# Patient Record
Sex: Female | Born: 1980
Health system: Southern US, Community
[De-identification: ages and names within clinical notes are randomized; demographics above are authoritative.]

## PROBLEM LIST (undated history)

## (undated) DIAGNOSIS — N200 Calculus of kidney: Secondary | ICD-10-CM

## (undated) DIAGNOSIS — M549 Dorsalgia, unspecified: Secondary | ICD-10-CM

## (undated) DIAGNOSIS — R600 Localized edema: Secondary | ICD-10-CM

## (undated) DIAGNOSIS — E282 Polycystic ovarian syndrome: Secondary | ICD-10-CM

## (undated) DIAGNOSIS — R51 Headache: Secondary | ICD-10-CM

## (undated) DIAGNOSIS — F1990 Other psychoactive substance use, unspecified, uncomplicated: Secondary | ICD-10-CM

## (undated) DIAGNOSIS — E559 Vitamin D deficiency, unspecified: Secondary | ICD-10-CM

## (undated) DIAGNOSIS — K589 Irritable bowel syndrome without diarrhea: Secondary | ICD-10-CM

## (undated) DIAGNOSIS — Z9889 Other specified postprocedural states: Secondary | ICD-10-CM

## (undated) DIAGNOSIS — R519 Headache, unspecified: Secondary | ICD-10-CM

## (undated) DIAGNOSIS — D649 Anemia, unspecified: Secondary | ICD-10-CM

## (undated) DIAGNOSIS — Z87442 Personal history of urinary calculi: Secondary | ICD-10-CM

## (undated) DIAGNOSIS — M2550 Pain in unspecified joint: Secondary | ICD-10-CM

## (undated) DIAGNOSIS — K829 Disease of gallbladder, unspecified: Secondary | ICD-10-CM

## (undated) DIAGNOSIS — R112 Nausea with vomiting, unspecified: Secondary | ICD-10-CM

## (undated) DIAGNOSIS — F419 Anxiety disorder, unspecified: Secondary | ICD-10-CM

## (undated) DIAGNOSIS — G4730 Sleep apnea, unspecified: Secondary | ICD-10-CM

## (undated) HISTORY — DX: Disease of gallbladder, unspecified: K82.9

## (undated) HISTORY — DX: Sleep apnea, unspecified: G47.30

## (undated) HISTORY — PX: LITHOTRIPSY: SUR834

## (undated) HISTORY — DX: Anxiety disorder, unspecified: F41.9

## (undated) HISTORY — DX: Anemia, unspecified: D64.9

## (undated) HISTORY — DX: Pain in unspecified joint: M25.50

## (undated) HISTORY — DX: Irritable bowel syndrome, unspecified: K58.9

## (undated) HISTORY — DX: Other psychoactive substance use, unspecified, uncomplicated: F19.90

## (undated) HISTORY — DX: Calculus of kidney: N20.0

## (undated) HISTORY — PX: LIPOMA EXCISION: SHX5283

## (undated) HISTORY — DX: Dorsalgia, unspecified: M54.9

## (undated) HISTORY — DX: Localized edema: R60.0

## (undated) HISTORY — DX: Vitamin D deficiency, unspecified: E55.9

## (undated) HISTORY — PX: CHOLECYSTECTOMY: SHX55

## (undated) HISTORY — DX: Polycystic ovarian syndrome: E28.2

## (undated) HISTORY — PX: URETEROSCOPY: SHX842

---
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
## 1999-02-02 ENCOUNTER — Emergency Department (HOSPITAL_COMMUNITY): Admission: EM | Admit: 1999-02-02 | Discharge: 1999-02-02 | Payer: Self-pay | Admitting: Emergency Medicine

## 1999-05-16 ENCOUNTER — Emergency Department (HOSPITAL_COMMUNITY): Admission: EM | Admit: 1999-05-16 | Discharge: 1999-05-16 | Payer: Self-pay | Admitting: Emergency Medicine

## 1999-06-06 ENCOUNTER — Emergency Department (HOSPITAL_COMMUNITY): Admission: EM | Admit: 1999-06-06 | Discharge: 1999-06-06 | Payer: Self-pay | Admitting: *Deleted

## 1999-08-08 ENCOUNTER — Encounter: Payer: Self-pay | Admitting: Emergency Medicine

## 1999-08-08 ENCOUNTER — Emergency Department (HOSPITAL_COMMUNITY): Admission: EM | Admit: 1999-08-08 | Discharge: 1999-08-08 | Payer: Self-pay | Admitting: Emergency Medicine

## 2000-01-09 ENCOUNTER — Emergency Department (HOSPITAL_COMMUNITY): Admission: EM | Admit: 2000-01-09 | Discharge: 2000-01-09 | Payer: Self-pay

## 2000-11-06 ENCOUNTER — Emergency Department (HOSPITAL_COMMUNITY): Admission: EM | Admit: 2000-11-06 | Discharge: 2000-11-06 | Payer: Self-pay | Admitting: Emergency Medicine

## 2000-11-23 ENCOUNTER — Other Ambulatory Visit: Admission: RE | Admit: 2000-11-23 | Discharge: 2000-11-23 | Payer: Self-pay | Admitting: Family Medicine

## 2001-03-26 ENCOUNTER — Emergency Department (HOSPITAL_COMMUNITY): Admission: EM | Admit: 2001-03-26 | Discharge: 2001-03-26 | Payer: Self-pay | Admitting: Emergency Medicine

## 2002-06-13 ENCOUNTER — Encounter: Payer: Self-pay | Admitting: Emergency Medicine

## 2002-06-13 ENCOUNTER — Emergency Department (HOSPITAL_COMMUNITY): Admission: EM | Admit: 2002-06-13 | Discharge: 2002-06-13 | Payer: Self-pay | Admitting: Emergency Medicine

## 2002-10-15 ENCOUNTER — Ambulatory Visit (HOSPITAL_COMMUNITY): Admission: RE | Admit: 2002-10-15 | Discharge: 2002-10-15 | Payer: Self-pay | Admitting: *Deleted

## 2003-01-07 ENCOUNTER — Ambulatory Visit (HOSPITAL_COMMUNITY): Admission: RE | Admit: 2003-01-07 | Discharge: 2003-01-07 | Payer: Self-pay | Admitting: *Deleted

## 2003-01-25 ENCOUNTER — Inpatient Hospital Stay (HOSPITAL_COMMUNITY): Admission: AD | Admit: 2003-01-25 | Discharge: 2003-01-25 | Payer: Self-pay | Admitting: *Deleted

## 2003-01-27 ENCOUNTER — Inpatient Hospital Stay (HOSPITAL_COMMUNITY): Admission: AD | Admit: 2003-01-27 | Discharge: 2003-01-29 | Payer: Self-pay | Admitting: *Deleted

## 2003-01-31 ENCOUNTER — Encounter: Admission: RE | Admit: 2003-01-31 | Discharge: 2003-01-31 | Payer: Self-pay | Admitting: *Deleted

## 2003-02-11 ENCOUNTER — Inpatient Hospital Stay (HOSPITAL_COMMUNITY): Admission: AD | Admit: 2003-02-11 | Discharge: 2003-02-14 | Payer: Self-pay | Admitting: Obstetrics & Gynecology

## 2003-02-15 ENCOUNTER — Inpatient Hospital Stay (HOSPITAL_COMMUNITY): Admission: AD | Admit: 2003-02-15 | Discharge: 2003-02-15 | Payer: Self-pay | Admitting: Family Medicine

## 2003-02-21 ENCOUNTER — Encounter: Admission: RE | Admit: 2003-02-21 | Discharge: 2003-02-21 | Payer: Self-pay | Admitting: *Deleted

## 2003-02-28 ENCOUNTER — Encounter: Admission: RE | Admit: 2003-02-28 | Discharge: 2003-02-28 | Payer: Self-pay | Admitting: *Deleted

## 2003-03-07 ENCOUNTER — Encounter: Admission: RE | Admit: 2003-03-07 | Discharge: 2003-03-07 | Payer: Self-pay | Admitting: *Deleted

## 2003-03-14 ENCOUNTER — Encounter: Admission: RE | Admit: 2003-03-14 | Discharge: 2003-03-14 | Payer: Self-pay | Admitting: *Deleted

## 2003-03-15 ENCOUNTER — Inpatient Hospital Stay (HOSPITAL_COMMUNITY): Admission: AD | Admit: 2003-03-15 | Discharge: 2003-03-15 | Payer: Self-pay | Admitting: Obstetrics & Gynecology

## 2003-03-20 ENCOUNTER — Inpatient Hospital Stay (HOSPITAL_COMMUNITY): Admission: AD | Admit: 2003-03-20 | Discharge: 2003-03-22 | Payer: Self-pay | Admitting: Obstetrics and Gynecology

## 2003-03-27 ENCOUNTER — Inpatient Hospital Stay (HOSPITAL_COMMUNITY): Admission: AD | Admit: 2003-03-27 | Discharge: 2003-03-28 | Payer: Self-pay | Admitting: Obstetrics and Gynecology

## 2003-08-03 ENCOUNTER — Emergency Department (HOSPITAL_COMMUNITY): Admission: EM | Admit: 2003-08-03 | Discharge: 2003-08-03 | Payer: Self-pay | Admitting: Emergency Medicine

## 2004-02-24 ENCOUNTER — Inpatient Hospital Stay (HOSPITAL_COMMUNITY): Admission: AD | Admit: 2004-02-24 | Discharge: 2004-02-24 | Payer: Self-pay | Admitting: Obstetrics and Gynecology

## 2005-02-23 ENCOUNTER — Inpatient Hospital Stay (HOSPITAL_COMMUNITY): Admission: AD | Admit: 2005-02-23 | Discharge: 2005-02-24 | Payer: Self-pay | Admitting: *Deleted

## 2005-05-20 ENCOUNTER — Ambulatory Visit (HOSPITAL_COMMUNITY): Admission: RE | Admit: 2005-05-20 | Discharge: 2005-05-20 | Payer: Self-pay | Admitting: Obstetrics

## 2005-07-12 ENCOUNTER — Emergency Department (HOSPITAL_COMMUNITY): Admission: EM | Admit: 2005-07-12 | Discharge: 2005-07-12 | Payer: Self-pay | Admitting: Emergency Medicine

## 2005-09-02 ENCOUNTER — Emergency Department (HOSPITAL_COMMUNITY): Admission: EM | Admit: 2005-09-02 | Discharge: 2005-09-02 | Payer: Self-pay | Admitting: Emergency Medicine

## 2005-09-17 ENCOUNTER — Inpatient Hospital Stay (HOSPITAL_COMMUNITY): Admission: AD | Admit: 2005-09-17 | Discharge: 2005-09-18 | Payer: Self-pay | Admitting: Obstetrics

## 2005-10-14 ENCOUNTER — Inpatient Hospital Stay (HOSPITAL_COMMUNITY): Admission: AD | Admit: 2005-10-14 | Discharge: 2005-10-16 | Payer: Self-pay | Admitting: Obstetrics

## 2005-10-17 ENCOUNTER — Inpatient Hospital Stay (HOSPITAL_COMMUNITY): Admission: AD | Admit: 2005-10-17 | Discharge: 2005-10-19 | Payer: Self-pay | Admitting: Obstetrics

## 2005-10-22 ENCOUNTER — Inpatient Hospital Stay (HOSPITAL_COMMUNITY): Admission: AD | Admit: 2005-10-22 | Discharge: 2005-10-25 | Payer: Self-pay | Admitting: Obstetrics

## 2006-02-15 ENCOUNTER — Emergency Department (HOSPITAL_COMMUNITY): Admission: EM | Admit: 2006-02-15 | Discharge: 2006-02-16 | Payer: Self-pay | Admitting: Emergency Medicine

## 2006-02-17 ENCOUNTER — Ambulatory Visit (HOSPITAL_COMMUNITY): Admission: RE | Admit: 2006-02-17 | Discharge: 2006-02-17 | Payer: Self-pay | Admitting: Urology

## 2006-02-17 ENCOUNTER — Emergency Department (HOSPITAL_COMMUNITY): Admission: EM | Admit: 2006-02-17 | Discharge: 2006-02-18 | Payer: Self-pay | Admitting: Emergency Medicine

## 2006-10-01 ENCOUNTER — Emergency Department (HOSPITAL_COMMUNITY): Admission: EM | Admit: 2006-10-01 | Discharge: 2006-10-01 | Payer: Self-pay | Admitting: Family Medicine

## 2006-11-14 ENCOUNTER — Emergency Department (HOSPITAL_COMMUNITY): Admission: EM | Admit: 2006-11-14 | Discharge: 2006-11-15 | Payer: Self-pay | Admitting: Family Medicine

## 2007-03-19 ENCOUNTER — Emergency Department (HOSPITAL_COMMUNITY): Admission: EM | Admit: 2007-03-19 | Discharge: 2007-03-19 | Payer: Self-pay | Admitting: Emergency Medicine

## 2007-05-18 ENCOUNTER — Ambulatory Visit (HOSPITAL_COMMUNITY): Admission: RE | Admit: 2007-05-18 | Discharge: 2007-05-18 | Payer: Self-pay | Admitting: Obstetrics

## 2007-07-10 ENCOUNTER — Emergency Department (HOSPITAL_COMMUNITY): Admission: EM | Admit: 2007-07-10 | Discharge: 2007-07-10 | Payer: Self-pay | Admitting: Family Medicine

## 2007-07-13 ENCOUNTER — Emergency Department (HOSPITAL_COMMUNITY): Admission: EM | Admit: 2007-07-13 | Discharge: 2007-07-13 | Payer: Self-pay | Admitting: Family Medicine

## 2007-09-05 ENCOUNTER — Emergency Department (HOSPITAL_COMMUNITY): Admission: EM | Admit: 2007-09-05 | Discharge: 2007-09-05 | Payer: Self-pay | Admitting: Family Medicine

## 2007-12-06 ENCOUNTER — Emergency Department (HOSPITAL_COMMUNITY): Admission: EM | Admit: 2007-12-06 | Discharge: 2007-12-06 | Payer: Self-pay | Admitting: Family Medicine

## 2007-12-20 ENCOUNTER — Emergency Department (HOSPITAL_COMMUNITY): Admission: EM | Admit: 2007-12-20 | Discharge: 2007-12-20 | Payer: Self-pay | Admitting: Emergency Medicine

## 2008-02-07 ENCOUNTER — Ambulatory Visit (HOSPITAL_COMMUNITY): Admission: RE | Admit: 2008-02-07 | Discharge: 2008-02-07 | Payer: Self-pay | Admitting: Orthopedic Surgery

## 2008-02-21 ENCOUNTER — Emergency Department (HOSPITAL_COMMUNITY): Admission: EM | Admit: 2008-02-21 | Discharge: 2008-02-21 | Payer: Self-pay | Admitting: Family Medicine

## 2008-05-04 ENCOUNTER — Emergency Department (HOSPITAL_COMMUNITY): Admission: EM | Admit: 2008-05-04 | Discharge: 2008-05-04 | Payer: Self-pay | Admitting: Emergency Medicine

## 2008-08-31 ENCOUNTER — Emergency Department: Payer: Self-pay | Admitting: Emergency Medicine

## 2008-11-22 ENCOUNTER — Emergency Department (HOSPITAL_COMMUNITY): Admission: EM | Admit: 2008-11-22 | Discharge: 2008-11-22 | Payer: Self-pay | Admitting: Family Medicine

## 2009-03-17 ENCOUNTER — Emergency Department (HOSPITAL_COMMUNITY): Admission: EM | Admit: 2009-03-17 | Discharge: 2009-03-17 | Payer: Self-pay | Admitting: Emergency Medicine

## 2009-04-09 ENCOUNTER — Emergency Department (HOSPITAL_COMMUNITY): Admission: EM | Admit: 2009-04-09 | Discharge: 2009-04-09 | Payer: Self-pay | Admitting: Emergency Medicine

## 2009-09-30 ENCOUNTER — Encounter: Admission: RE | Admit: 2009-09-30 | Discharge: 2009-09-30 | Payer: Self-pay | Admitting: Family Medicine

## 2010-03-08 ENCOUNTER — Encounter: Payer: Self-pay | Admitting: Obstetrics

## 2010-03-08 ENCOUNTER — Encounter: Payer: Self-pay | Admitting: Orthopedic Surgery

## 2010-04-18 ENCOUNTER — Emergency Department: Payer: Self-pay | Admitting: Emergency Medicine

## 2010-05-03 LAB — POCT URINALYSIS DIP (DEVICE)
Bilirubin Urine: NEGATIVE
Glucose, UA: NEGATIVE mg/dL
Hgb urine dipstick: NEGATIVE
Ketones, ur: NEGATIVE mg/dL
Nitrite: NEGATIVE
Protein, ur: NEGATIVE mg/dL
Specific Gravity, Urine: 1.015 (ref 1.005–1.030)
Urobilinogen, UA: 1 mg/dL (ref 0.0–1.0)
pH: 8.5 — ABNORMAL HIGH (ref 5.0–8.0)

## 2010-05-03 LAB — WET PREP, GENITAL
Trich, Wet Prep: NONE SEEN
Yeast Wet Prep HPF POC: NONE SEEN

## 2010-05-03 LAB — POCT PREGNANCY, URINE: Preg Test, Ur: NEGATIVE

## 2010-05-03 LAB — GC/CHLAMYDIA PROBE AMP, GENITAL
Chlamydia, DNA Probe: NEGATIVE
GC Probe Amp, Genital: NEGATIVE

## 2010-07-03 NOTE — Op Note (Signed)
NAMEVALARIE, FARACE                         ACCOUNT NO.:  [id number]   MEDICAL RECORD NO.:  [id number]                   PATIENT TYPE:  AMB   LOCATION:  DAY                                  FACILITY:  [facility]   PHYSICIAN:  Bertram Millard. Dahlstedt, M.D.          DATE OF BIRTH:  1981/01/08   DATE OF PROCEDURE:  02/13/2003  DATE OF DISCHARGE:                                 OPERATIVE REPORT   PREOPERATIVE DIAGNOSES:  Right ureteral stone with hydronephrosis, 35 week  intrauterine pregnancy.   POSTOPERATIVE DIAGNOSES:  Right ureteral stone with hydronephrosis, 35 week  intrauterine pregnancy.   PRINCIPAL PROCEDURE:  Cystoscopy, right ureteral stone extraction, double J  stent placement.   SURGEON:  Bertram Millard. Dahlstedt, M.D.   ANESTHESIA:  Subarachnoid block.   COMPLICATIONS:  None.   BRIEF HISTORY:  A 30 year old female who I have seen several times over the  past two weeks. She has recently passed a right ureteral stone. When she  passed her ureteral stone, she was found to have a renal stone. She recently  had this drop into her ureter and she has been quite symptomatic over the  past few days. She has presented in my office once and was admitted to the  hospital two days ago for pain management.   At this point, she has had nonprogression of the stone.  It was recommended  that she have ureteroscopy with stone extraction.  Other alternatives of  watchful waiting and lithotripsy were discussed.  Lithotripsy is not an  option due to the large size of the fetus and the amount of radiation  necessary.  We talked about the risk of performing this procedure, the risk  of producing early labor versus the risk of early labor from pain and from  pyelonephritis. She agrees to proceed with surgery.   DESCRIPTION OF PROCEDURE:  The patient was administered preoperative IV  antibiotics in the holding room and taken to the operating room where the  subarachnoid block was performed by Dr.  Okey Dupre. The patient was placed into  the dorsal lithotomy position.  Genitalia and perineum were prepped and  draped.  A 6 French ureteroscope was advanced directly into the right ureter  where the stone was encountered. There was seemingly a small stone behind  this one stone. However, it was easily fragmented with the beak of the  scope.  The one stone present distally in the ureter was grasped with the  basket and extracted. I felt that with a little bit of spasm where the stone  was, and the patient's two stones over a two week period, it would be best  to stent her.  I placed a guidewire up into the right renal pelvis directly  without using fluoroscopy.  Over the top of this, I put a 24 cm 6 [place]  double J stent.  A good curl was seen distally. The string was left and  taped to the patient's perineum.   The patient tolerated the procedure well. She will be monitored in the PACU  for fetal heart tones and then returned to the antenatal unit at [facility].                                               Bertram Millard. Dahlstedt, M.D.    SMD/MEDQ  D:  02/13/2003  T:  02/13/2003  Job:  [id number]   cc:   Lesly Dukes, M.D.

## 2010-07-03 NOTE — Discharge Summary (Signed)
NAMELAURITA, [name]                         ACCOUNT NO.:  [id number]   MEDICAL RECORD NO.:  [id number]                   PATIENT TYPE:  INP   LOCATION:  9128                                 [facility]ILITY:  [facility]   PHYSICIAN:  Phil D. Okey Dupre, M.D.                  DATE OF BIRTH:  1980/06/02   DATE OF ADMISSION:  03/27/2003  DATE OF DISCHARGE:  03/28/2003                                 DISCHARGE SUMMARY   DISCHARGE DIAGNOSES:  1. Pyelonephritis.  2. Status post vaginal delivery on March 20, 2003.   DISCHARGE MEDICATIONS:  Tequin 400 mg one tab p.o. daily, #13.   FOLLOWUP:  The patient is to follow up with her primary care physician in  two weeks.   HOSPITAL PROCEDURES:  None.   PRESENTING HISTORY:  This is  30 year old, G2, P2, who presented on the  afternoon of March 27, 2003, with a chief complaint of fever, right-sided  back pain, and pain with urination times three days.  Of note the patient  had been discharged home five days prior to presentation after delivering an  infant on March 20, 2003, here at [facility].   MEDICATIONS:  Prenatal vitamins.   ALLERGIES:  No known drug allergies.   PAST MEDICAL HISTORY:  OB history--vaginal delivery times two. One just  previously and the other in December 1997.  GYN history--abnormal Pap in  1999 and 2000 coupled with biopsy in 2000, which was negative.  Medical  history--was admitted on February 11, 2003, for cystoscopy for renal  calculi. At that time hydronephrosis was noted.   PAST SURGICAL HISTORY:  Negative.   SOCIAL HISTORY:  The patient smokes three to four cigarettes a day. No  alcohol.   PHYSICAL EXAMINATION:  On presentation, the temperature is 101.9, pulse 114,  respiratory rate 20, blood pressure 144/70.  GENERAL APPEARANCE: Alert, pleasant female in no acute distress.  LUNGS: Clear to auscultation bilaterally.  CARDIOVASCULAR: Regular rate and rhythm with a 2/6 systolic murmur.  ABDOMEN: Soft. Positive  bowel sounds. Some right upper quadrant tenderness  and tenderness in the suprapubic area.  BACK: She did have positive right CVA tenderness.   LABORATORY DATA:  UA was positive for nitrites, rbc's greater than 200.  Protein micro showed white blood cells and rbc's with too numerous count  bacteria.  Assessment at that time was pyelonephritis and patient was  admitted for further workup.   HOSPITAL COURSE:  The patient was admitted and started on IV Rocephin 1 gm  q.24h. and urine was sent for culture.  The patient improved rapidly over  the course of her admission. She was not nauseous or vomiting at the time of  discharge. Therefore, she was discharged home on Tequin with instructions to  call with fever greater than 103 or nausea or vomiting so that she could not  keep down fluids or her medications. She is to  follow up with her primary  care physician in two weeks, to call us if she has any problems prior to  that time.   PENDING LABORATORY:  The patient does have a laboratory that did come back  E. coli in her urine. Sensitivities are still pending at the time of  discharge.     Bradly Bienenstock, M.D.                         Phil D. Okey Dupre, M.D.    [name]  D:  03/28/2003  T:  03/28/2003  Job:  [id number]

## 2010-07-03 NOTE — Discharge Summary (Signed)
[name], [name]                         ACCOUNT NO.:  [id number]   MEDICAL RECORD NO.:  [id number]                   PATIENT TYPE:  INP   LOCATION:  9105                                 [facility]ACILITY:  [facility]   PHYSICIAN:  Conni Elliot, M.D.             DATE OF BIRTH:  12-07-1980   DATE OF ADMISSION:  01/27/2003  DATE OF DISCHARGE:  01/29/2003                                 DISCHARGE SUMMARY   ATTENDING PHYSICIAN:  Conni Elliot, M.D.   INTERN:  Ursula Beath, M.D.   DISCHARGE DIAGNOSES:  1. Nephrolithiasis.  2. Intrauterine pregnancy.   DISCHARGE MEDICATIONS:  1. Prenatal vitamins one p.o. daily.  2. Ceftin 250 mg one p.o. b.i.d. x9 days.  3. Percocet 5 per 325 mg one p.o. q.4-6h. p.r.n. pain.   DISPOSITION:  Is discharged to home.   FOLLOWUP:  Will be at the [facility] on Thursday,  January 31, 2003.  The patient was instructed that she will be called with  an appointment, and to call them on Wednesday if she does not hear from  them.   PROCEDURE:  1. A CT of the abdomen.  2. A renal ultrasound showing moderate right hydronephrosis, but no visible     stones, and a left kidney which is normal.  The results of this CT scan     showed no appendicitis, a 3.0 mm stone at the right ureteropelvic     junction, and moderate right hydronephrosis.  3. She also underwent an obstetrical ultrasound showing a single live     intrauterine pregnancy and the abdominal circumference measuring     approximately 2-1/2 weeks, with an estimated fetal weight of greater than     90th percentile.  The AFI was within normal limits.  No evidence of     placental abruption or previa.   CONSULTATIONS:  Consultation was with Dr. Bertram Millard. Dahlstedt of urology.   HISTORY OF PRESENT ILLNESS:  This is a 30 year old G 2, P 1, 0, 0, 1 at 33  weeks, presenting with right lower quadrant pain that developed on the night  prior to admission, with constant pain and  worsened with movement.  She had  a similar episode last year.  Her father has a history of kidney stones.  She also describes some nausea and vomiting.  Her pain is constant.   PHYSICAL EXAMINATION:  ABDOMEN:  The patient has guarding and rebound on the  right side.  An ultrasound showed right hydronephrosis but no stone.   LABORATORY DATA:  White count 13, H&H 10.3 and 30.2, platelets 237.  A CBC  on the subsequent day showed a white count of 15.3, H&H of 10 and 29,  platelets 240.  She had an 87% neutrophil count.  A Chem-7 showed a sodium  of 135, potassium 3.7, chloride 103, bicarbonate 25, BUN 16, creatinine 0.9,  glucose 91.  A  total bilirubin of 0.3, alkaline phosphatase 59, SGOT 15,  SGPT less than 19, total protein 6.8, albumin 3.3, calcium 9.0, uric acid  3.8, LDH 113.  A urinalysis showed specific gravity greater than 1039, pH of  5.5, negative for glucose, negative for bilirubin, negative for ketones,  trace blood, negative protein, 0.2 urobilinogen, negative nitrites, negative  leukocytes, a few epithelials, 0-2 white cells, 0-2 red cells, a few  bacteria and mucus.   HOSPITAL COURSE:  #1 - NEPHROLITHIASIS:  The patient was admitted for pain  control, and was started on prophylactic antibiotics on the day after her  admission.  She was afebrile, and the night prior to discharge she passed a  stone.  A urology consultation stated that if the patient did not pass the  stone, they would come back on the Wednesday after admission and break it  up; however, this did not become a necessity.  The patient remained  afebrile, but was continued on prophylactic antibiotics.  She will be given  a prescription to continue these at home.  She will be seen in the [facility] on the Thursday after discharge.  #2 - INTRAUTERINE PREGNANCY:  Apparently her most recent ultrasound was  suggestive of macrosomia, and it was suggested that this be followed  carefully at the [facility]  or with her primary care doctor.     Ursula Beath, MD                     Conni Elliot, M.D.    JT/MEDQ  D:  01/29/2003  T:  01/29/2003  Job:  [id number]   cc:   Bertram Millard. Dahlstedt, M.D.  [address]  [place]  [place] [address]  Fax: [phone]   [facility] - [facility]

## 2010-07-03 NOTE — Discharge Summary (Signed)
[name], [name]                         ACCOUNT NO.:  [id number]   MEDICAL RECORD NO.:  [id number]                   PATIENT TYPE:  INP   LOCATION:  9103                                 [facility]ACILITY[facility]   PHYSICIAN:  Bertram Millard. Dahlstedt, M.D.          DATE OF BIRTH:  March 03, 1980   DATE OF ADMISSION:  02/11/2003  DATE OF DISCHARGE:  02/14/2003                                 DISCHARGE SUMMARY   DIAGNOSES:  1. Right ureteral calculus.  2. Intrauterine pregnancy, third trimester.   PRINCIPAL PROCEDURE:  February 13, 2003 - cystoscopy, right ureteral stone  extraction, double J stent placement.   BRIEF HISTORY:  This 30 year old female is a gravida 2 para 1 and is at 35  weeks intrauterine pregnancy.  I had seen her several times over the 2 weeks  prior to this admission.  She passed a right ureteral stone.  At the time of  her first presentation to me, she also had a right renal stone.  This  recently, by history, fell into her right ureter.  She has been symptomatic  over the past several days.  I have seen her as an outpatient.   She had significant pain and she was difficult to manage as an outpatient.  She was therefore admitted to [facility] teaching service for pain  management and possible urologic intervention.   Past medical history significant for a prior vaginal delivery.  She passed a  stone 2 weeks ago and was hospitalized for that.  Medical history is  otherwise unremarkable.   Medications at the time of admission included prenatal vitamins.  There were  no known drug allergies.   Examination revealed a young adult female in moderate distress.  She weighed  260 pounds.  Height 5 feet 2 inches.  She was afebrile.  Blood pressure  113/55, pulse 90.  Examination was performed by Dr. Penne Lash.  She was in  pain.  She had a gravid abdomen.  She had right CVA tenderness.  She had  trace edema of her lower extremities, HEENT normal, chest clear, breast exam  not  performed, heart regular rate and rhythm.   HOSPITAL COURSE:  The patient was admitted to the [facility] service.  She was  placed on adequate IV analgesia.  Based on her symptoms I felt that she had  a stone at her right UVJ.  By December 29 she had persistent pain and had  not passed a stone.  At this point she had a neonatology consult to proceed  with surgery.  The patient was cleared.  She underwent pre- and  postoperative fetal monitoring.  On December 29 she underwent right  ureteroscopy with stone extraction and a double J stent was placed.  This  was done with a spinal anesthetic.  The patient tolerated the procedure  well.  She was discharged home postoperatively on December 30.  She did  well.  A prescription  for Keflex 500 mg daily for 5 days was called in.  It  was arranged for her to see me in the office a week after discharge.  She  was in improved condition at the time of discharge.                                               Bertram Millard. Dahlstedt, M.D.    SMD/MEDQ  D:  05/01/2003  T:  05/03/2003  Job:  [id number]

## 2010-07-03 NOTE — Consult Note (Signed)
NAMEAIMAN, [name]                         ACCOUNT NO.:  [id number]   MEDICAL RECORD NO.:  [id number]                   [name] TYPE:  OBV   LOCATION:  9105                                 [facility]ACILITY:  [facility]   PHYSICIAN:  Bertram Millard. Dahlstedt, M.D.          DATE OF BIRTH:  1980/05/26   DATE OF CONSULTATION:  01/28/2003  DATE OF DISCHARGE:                                   CONSULTATION   REASON FOR CONSULTATION:  Right ureteral stone.   BRIEF HISTORY:  [name] with Tiffany remote of Tiffany possible stone,  approximately two years ago. [name] was admitted to Tiffany hospital yesterday with  Tiffany 24-hour history of crampy right abdominal and flank pain, nausea, and  vomiting.  This was fairly severe. [name] presented to Tiffany emergency department  at [facility] and was subsequently admitted for possible right  ureteral stone.   [name] had an ultrasound at that time which revealed significant right  hydronephrosis. No stones were seen on either side. [name] had an elevated  white count of 13,000. [name] had trace blood on urine dipstick.   CT scan was performed which was showed Tiffany small, 3 mm, right distal ureteral  stone with significant hydronephrosis. Again, no stones were seen in Tiffany  kidneys. Urologic consultation is requested.   Past history is significant for kidney stone. [name] is gravida 2, para 1, and  has Tiffany young daughter. [name] has smoked in Tiffany past, but quit with [name]  pregnancy. [name] is status post cervical biopsy and colposcopy done four years  ago.   PHYSICAL EXAMINATION:  Exam reveals minimal right CVA and right left lower  quadrant tenderness. [name] has no rebound or guarding. [name] has no left-sided  pain. [name] has an obviously gravid abdomen.   I reviewed [name]'s CT scan. This shows Tiffany previously mentioned  findings at Tiffany right UVJ.   IMPRESSION:  1. Right ureteral calculus with significant hydronephrosis and pain, which     is managed at Tiffany present time appropriately with  intravenous Stadol.  2. Intrauterine pregnancy.   PLAN:  1. At this point, would give [name] about 24 more hours to see if we     have [name] pass Tiffany stone without operative intervention.  2. If [name] persists with pain, would recommend proceeding on Wednesday,     December 15th, with ureteroscopy and stone extraction. I discussed this     with [name] and [name] mother.  3. I will follow [name] up tomorrow and make Tiffany necessary arrangements if [name]     does not pass Tiffany stone.  4. Would continue Stadol and antibiotic prophylaxis as written.  Bertram Millard. Dahlstedt, M.D.    SMD/MEDQ  D:  01/28/2003  T:  01/28/2003  Job:  [id number]

## 2010-11-06 LAB — POCT URINALYSIS DIP (DEVICE)
Bilirubin Urine: NEGATIVE
Glucose, UA: NEGATIVE
Hgb urine dipstick: NEGATIVE
Ketones, ur: NEGATIVE
Nitrite: NEGATIVE
Operator id: 235561
Protein, ur: NEGATIVE
Specific Gravity, Urine: 1.025
Urobilinogen, UA: 0.2
pH: 6.5

## 2010-11-06 LAB — DIFFERENTIAL
Basophils Absolute: 0.1
Basophils Relative: 1
Eosinophils Absolute: 0.2
Eosinophils Relative: 2
Lymphocytes Relative: 28
Lymphs Abs: 2.4
Monocytes Absolute: 0.5
Monocytes Relative: 7
Neutro Abs: 5.2
Neutrophils Relative %: 63

## 2010-11-06 LAB — POCT PREGNANCY, URINE
Operator id: 235561
Preg Test, Ur: NEGATIVE

## 2010-11-06 LAB — CBC
HCT: 36.5
Hemoglobin: 12.3
MCHC: 33.8
MCV: 88.2
Platelets: 233
RBC: 4.14
RDW: 13.8
WBC: 8.4

## 2010-11-06 LAB — WET PREP, GENITAL
Clue Cells Wet Prep HPF POC: NONE SEEN
Trich, Wet Prep: NONE SEEN
WBC, Wet Prep HPF POC: NONE SEEN
Yeast Wet Prep HPF POC: NONE SEEN

## 2010-11-06 LAB — BASIC METABOLIC PANEL
BUN: 9
CO2: 25
Calcium: 9
Chloride: 109
Creatinine, Ser: 0.69
GFR calc Af Amer: >60
GFR calc non Af Amer: >60
Glucose, Bld: 96
Potassium: 3.6
Sodium: 139

## 2010-11-06 LAB — GC/CHLAMYDIA PROBE AMP, GENITAL
Chlamydia, DNA Probe: NEGATIVE
GC Probe Amp, Genital: NEGATIVE

## 2010-11-06 LAB — RPR: RPR Ser Ql: NONREACTIVE

## 2010-11-11 LAB — POCT RAPID STREP A: Streptococcus, Group A Screen (Direct): POSITIVE — AB

## 2010-11-13 LAB — POCT RAPID STREP A: Streptococcus, Group A Screen (Direct): POSITIVE — AB

## 2010-11-18 LAB — URINALYSIS, ROUTINE W REFLEX MICROSCOPIC
Bilirubin Urine: NEGATIVE
Glucose, UA: NEGATIVE
Hgb urine dipstick: NEGATIVE
Ketones, ur: NEGATIVE
Nitrite: NEGATIVE
Protein, ur: NEGATIVE
Specific Gravity, Urine: 1.019
Urobilinogen, UA: 0.2
pH: 5.5

## 2010-11-18 LAB — PREGNANCY, URINE: Preg Test, Ur: NEGATIVE

## 2010-11-26 LAB — BASIC METABOLIC PANEL
BUN: 14
CO2: 27
Calcium: 9.4
Chloride: 103
Creatinine, Ser: 0.73
GFR calc Af Amer: >60
GFR calc non Af Amer: >60
Glucose, Bld: 82
Potassium: 3.8
Sodium: 138

## 2010-11-26 LAB — POCT URINALYSIS DIP (DEVICE)
Glucose, UA: NEGATIVE
Ketones, ur: NEGATIVE
Nitrite: NEGATIVE
Operator id: 116391
Protein, ur: NEGATIVE
Specific Gravity, Urine: >=1.03
Urobilinogen, UA: 1
pH: 6

## 2010-11-26 LAB — DIFFERENTIAL
Basophils Absolute: 0
Basophils Relative: 0
Eosinophils Absolute: 0.2
Eosinophils Relative: 1
Lymphocytes Relative: 27
Lymphs Abs: 3
Monocytes Absolute: 0.6
Monocytes Relative: 5
Neutro Abs: 7.3
Neutrophils Relative %: 66

## 2010-11-26 LAB — CBC
HCT: 39
Hemoglobin: 13.2
MCHC: 33.8
MCV: 87.5
Platelets: 319
RBC: 4.45
RDW: 14.5 — ABNORMAL HIGH
WBC: 11.2 — ABNORMAL HIGH

## 2010-11-26 LAB — POCT PREGNANCY, URINE
Operator id: 116391
Preg Test, Ur: NEGATIVE

## 2011-03-09 ENCOUNTER — Other Ambulatory Visit: Payer: Self-pay | Admitting: Family Medicine

## 2011-03-09 DIAGNOSIS — R109 Unspecified abdominal pain: Secondary | ICD-10-CM

## 2011-03-10 ENCOUNTER — Ambulatory Visit
Admission: RE | Admit: 2011-03-10 | Discharge: 2011-03-10 | Disposition: A | Discharge status: Home / Self Care | Payer: BC Managed Care – PPO | Source: Ambulatory Visit | Attending: Family Medicine | Admitting: Family Medicine

## 2011-03-10 DIAGNOSIS — R109 Unspecified abdominal pain: Secondary | ICD-10-CM

## 2011-04-09 ENCOUNTER — Inpatient Hospital Stay (HOSPITAL_COMMUNITY): Admit: 2011-04-09 | Payer: Self-pay

## 2011-10-14 ENCOUNTER — Emergency Department: Payer: Self-pay | Admitting: Emergency Medicine

## 2011-10-14 LAB — CBC
HCT: 36.8 % (ref 35.0–47.0)
HGB: 12.2 g/dL (ref 12.0–16.0)
MCH: 30.2 pg (ref 26.0–34.0)
MCHC: 33.2 g/dL (ref 32.0–36.0)
MCV: 91 fL (ref 80–100)
Platelet: 264 10*3/uL (ref 150–440)
RBC: 4.05 10*6/uL (ref 3.80–5.20)
RDW: 14.1 % (ref 11.5–14.5)
WBC: 7.6 10*3/uL (ref 3.6–11.0)

## 2011-10-14 LAB — COMPREHENSIVE METABOLIC PANEL
Albumin: 3.8 g/dL (ref 3.4–5.0)
Alkaline Phosphatase: 48 U/L — ABNORMAL LOW (ref 50–136)
Anion Gap: 5 — ABNORMAL LOW (ref 7–16)
BUN: 11 mg/dL (ref 7–18)
Bilirubin,Total: 0.4 mg/dL (ref 0.2–1.0)
Calcium, Total: 9 mg/dL (ref 8.5–10.1)
Chloride: 106 mmol/L (ref 98–107)
Co2: 27 mmol/L (ref 21–32)
Creatinine: 0.55 mg/dL — ABNORMAL LOW (ref 0.60–1.30)
EGFR (African American): >60
EGFR (Non-African Amer.): >60
Glucose: 90 mg/dL (ref 65–99)
Osmolality: 275 (ref 275–301)
Potassium: 4.1 mmol/L (ref 3.5–5.1)
SGOT(AST): 8 U/L — ABNORMAL LOW (ref 15–37)
SGPT (ALT): 13 U/L (ref 12–78)
Sodium: 138 mmol/L (ref 136–145)
Total Protein: 7.4 g/dL (ref 6.4–8.2)

## 2011-10-14 LAB — URINALYSIS, COMPLETE
Bilirubin,UR: NEGATIVE
Blood: NEGATIVE
Glucose,UR: NEGATIVE mg/dL (ref 0–75)
Ketone: NEGATIVE
Leukocyte Esterase: NEGATIVE
Nitrite: NEGATIVE
Ph: 7 (ref 4.5–8.0)
Protein: NEGATIVE
RBC,UR: 2 /HPF (ref 0–5)
Specific Gravity: 1.018 (ref 1.003–1.030)
Squamous Epithelial: 2
WBC UR: <1 /HPF (ref 0–5)

## 2011-10-14 LAB — PREGNANCY, URINE: Pregnancy Test, Urine: NEGATIVE m[IU]/mL

## 2011-10-14 LAB — LIPASE, BLOOD: Lipase: 112 U/L (ref 73–393)

## 2012-12-15 ENCOUNTER — Ambulatory Visit (INDEPENDENT_AMBULATORY_CARE_PROVIDER_SITE_OTHER): Payer: 59 | Admitting: Advanced Practice Midwife

## 2012-12-15 ENCOUNTER — Encounter: Payer: Self-pay | Admitting: Advanced Practice Midwife

## 2012-12-15 VITALS — BP 125/83 | HR 78 | Temp 98.1°F | Ht 63.0 in | Wt 254.0 lb

## 2012-12-15 DIAGNOSIS — R102 Pelvic and perineal pain: Secondary | ICD-10-CM

## 2012-12-15 DIAGNOSIS — R1900 Intra-abdominal and pelvic swelling, mass and lump, unspecified site: Secondary | ICD-10-CM

## 2012-12-15 DIAGNOSIS — Z975 Presence of (intrauterine) contraceptive device: Secondary | ICD-10-CM

## 2012-12-15 DIAGNOSIS — Z113 Encounter for screening for infections with a predominantly sexual mode of transmission: Secondary | ICD-10-CM

## 2012-12-15 DIAGNOSIS — Z3202 Encounter for pregnancy test, result negative: Secondary | ICD-10-CM

## 2012-12-15 LAB — POCT URINE PREGNANCY: Preg Test, Ur: NEGATIVE

## 2012-12-15 NOTE — Progress Notes (Signed)
Subjective:     Patient ID: [name], female   DOB: April 01, 1980, 32 y.o.   MRN: [id number]  Gynecologic Exam The patient's primary symptoms include pelvic pain. The patient's pertinent negatives include no genital lesions, genital odor, genital rash, vaginal bleeding or vaginal discharge. Pertinent negatives include no abdominal pain, chills, constipation, diarrhea, discolored urine, dysuria, fever, flank pain, headaches, joint pain, joint swelling, nausea, painful intercourse, rash, sore throat, urgency or vomiting.  Pelvic Pain The patient's primary symptoms include pelvic pain. The patient's pertinent negatives include no genital lesions, genital odor, genital rash, vaginal bleeding or vaginal discharge. The current episode started more than 1 month ago. The problem occurs intermittently (3-4 days each month). The problem has been gradually worsening. The pain is moderate. The problem affects the right side. She is not pregnant. Pertinent negatives include no abdominal pain, chills, constipation, diarrhea, discolored urine, dysuria, fever, flank pain, headaches, joint pain, joint swelling, nausea, painful intercourse, rash, sore throat, urgency or vomiting. The symptoms are aggravated by intercourse. She has tried NSAIDs for the symptoms. The treatment provided mild relief. She is sexually active. No, her partner does not have an STD. She uses an IUD for contraception. Her menstrual history has been regular.     Review of Systems  Constitutional: Negative for fever and chills.  HENT: Negative for sore throat.   Gastrointestinal: Negative for nausea, vomiting, abdominal pain, diarrhea and constipation.  Genitourinary: Positive for pelvic pain. Negative for dysuria, urgency, flank pain and vaginal discharge.  Musculoskeletal: Negative for joint pain.  Skin: Negative for rash.  Neurological: Negative for headaches.       Objective:   Physical Exam  Constitutional: She is oriented to  person, place, and time.  Pulmonary/Chest: Effort normal.  Abdominal: Soft.  Genitourinary: Vagina normal and uterus normal.  Physical Examination: Pelvic - VULVA: normal appearing vulva with no masses, tenderness or lesions, VAGINA: normal appearing vagina with normal color and discharge, no lesions, CERVIX: normal appearing cervix without discharge or lesions, UTERUS: tenderness in RLQ, fullness, unable to rule out mass due to adipose tissue, ADNEXA: tenderness  Musculoskeletal: Normal range of motion.  Neurological: She is alert and oriented to person, place, and time.  Skin: Skin is warm and dry.  Psychiatric: She has a normal mood and affect. Her behavior is normal. Judgment and thought content normal.   IUD strings visualized in right portion of cervix, strings very short.     Assessment:     Patient Active Problem List   Diagnosis Date Noted  . Pelvic pain 12/15/2012   RLQ pain, unable to rule out mass on PE IUD in place    Plan:     Pelvic US pending Sent GC/CT and Affirm Patient to RTC following [name]   20 min spent with patient greater than 80% spent in counseling and coordination of care.    Avi Kerschner Wilson Singer CNM

## 2012-12-15 NOTE — Progress Notes (Signed)
Pt in office today for problem visit, states she is having pain to right Lower Quadrant just above pelvis, states she has felt a lump in the area, increased pain in area once a month for 3-4 days, also painful during intercourse.

## 2012-12-15 NOTE — Addendum Note (Signed)
Addended by: Tris Howell H on: 12/15/2012 03:02 PM   Modules accepted: Orders  

## 2012-12-16 LAB — GC/CHLAMYDIA PROBE AMP
CT Probe RNA: NEGATIVE
GC Probe RNA: NEGATIVE

## 2012-12-16 LAB — WET PREP BY MOLECULAR PROBE
Candida species: NEGATIVE
Gardnerella vaginalis: NEGATIVE
Trichomonas vaginosis: NEGATIVE

## 2012-12-20 ENCOUNTER — Other Ambulatory Visit: Payer: Self-pay | Admitting: Advanced Practice Midwife

## 2012-12-20 DIAGNOSIS — R1032 Left lower quadrant pain: Secondary | ICD-10-CM

## 2012-12-20 DIAGNOSIS — R1900 Intra-abdominal and pelvic swelling, mass and lump, unspecified site: Secondary | ICD-10-CM

## 2012-12-26 ENCOUNTER — Ambulatory Visit (INDEPENDENT_AMBULATORY_CARE_PROVIDER_SITE_OTHER): Payer: 59 | Admitting: Advanced Practice Midwife

## 2012-12-26 ENCOUNTER — Encounter: Payer: Self-pay | Admitting: Advanced Practice Midwife

## 2012-12-26 ENCOUNTER — Ambulatory Visit (INDEPENDENT_AMBULATORY_CARE_PROVIDER_SITE_OTHER): Payer: 59

## 2012-12-26 VITALS — Temp 97.9°F | Ht 63.0 in | Wt 254.0 lb

## 2012-12-26 DIAGNOSIS — IMO0002 Reserved for concepts with insufficient information to code with codable children: Secondary | ICD-10-CM

## 2012-12-26 DIAGNOSIS — R1032 Left lower quadrant pain: Secondary | ICD-10-CM

## 2012-12-26 DIAGNOSIS — R102 Pelvic and perineal pain: Secondary | ICD-10-CM

## 2012-12-26 DIAGNOSIS — Z975 Presence of (intrauterine) contraceptive device: Secondary | ICD-10-CM

## 2012-12-26 DIAGNOSIS — R1900 Intra-abdominal and pelvic swelling, mass and lump, unspecified site: Secondary | ICD-10-CM

## 2012-12-26 NOTE — Progress Notes (Signed)
  Subjective:    Patient ID: [name], female    DOB: 04-Jun-1980, 32 y.o.   MRN: [id number]  HPI  Pain continues. GYN evaluation negative.  Patient would like recommendations for Acne. She has tried everything without improvement.   Pelvic Pain  The patient's primary symptoms include pelvic pain. The patient's pertinent negatives include no genital lesions, genital odor, genital rash, vaginal bleeding or vaginal discharge. The current episode started more than 1 month ago. The problem occurs intermittently (3-4 days each month). The problem has been gradually worsening. The pain is moderate. The problem affects the right side. She is not pregnant. Pertinent negatives include no abdominal pain, chills, constipation, diarrhea, discolored urine, dysuria, fever, flank pain, headaches, joint pain, joint swelling, nausea, painful intercourse, rash, sore throat, urgency or vomiting. The symptoms are aggravated by intercourse. She has tried NSAIDs for the symptoms. The treatment provided mild relief. She is sexually active. No, her partner does not have an STD. She uses an IUD for contraception. Her menstrual history has been regular.    Review of Systems  Constitutional: Negative.   HENT: Negative.   Eyes: Negative.   Respiratory: Negative.   Gastrointestinal: Positive for abdominal pain.  Endocrine: Negative.   Genitourinary: Positive for pelvic pain and dyspareunia.  Musculoskeletal: Negative.   Skin:       Facial acne for 8 years   Allergic/Immunologic: Negative.   Neurological: Negative.   Psychiatric/Behavioral: Negative.        Objective:   Physical Exam  Filed Vitals:   12/26/12 1108  Temp: 97.9 F (36.6 C)    Filed Vitals:   12/26/12 1108  Height: 5\' 3"  (1.6 m)  Weight: 254 lb (115.214 kg)   Exam deferred  Results reviewed: Pelvic US WNL Lab results negative       Assessment & Plan:  Discussed with patient that she might have relief of pain if she removed  her IUD and thought about alternative forms of birth control. They do not want more children so Long acting or permanent contraception is an option.  If after removal symptoms do not resolve we could also reinsert.   I discussed that this might resolve her acne and pain. Patient will discuss this w/ her husband and get back with me.  If this is not helpful I would consider referral to PCP or General for rule out hernia or other concerns.  20 min spent with patient greater than 80% spent in counseling and coordination of care.   Graesyn Schreifels Wilson Singer CNM

## 2013-01-15 ENCOUNTER — Ambulatory Visit
Admission: RE | Admit: 2013-01-15 | Discharge: 2013-01-15 | Disposition: A | Discharge status: Home / Self Care | Payer: 59 | Source: Ambulatory Visit | Attending: Family Medicine | Admitting: Family Medicine

## 2013-01-15 ENCOUNTER — Other Ambulatory Visit: Payer: Self-pay | Admitting: Family Medicine

## 2013-01-15 DIAGNOSIS — R109 Unspecified abdominal pain: Secondary | ICD-10-CM

## 2013-01-15 DIAGNOSIS — R110 Nausea: Secondary | ICD-10-CM

## 2013-01-17 ENCOUNTER — Other Ambulatory Visit (HOSPITAL_COMMUNITY): Payer: Self-pay | Admitting: Family Medicine

## 2013-01-17 DIAGNOSIS — R1011 Right upper quadrant pain: Secondary | ICD-10-CM

## 2013-01-31 ENCOUNTER — Encounter (HOSPITAL_COMMUNITY)
Admission: RE | Admit: 2013-01-31 | Discharge: 2013-01-31 | Disposition: A | Discharge status: Home / Self Care | Payer: 59 | Source: Ambulatory Visit | Attending: Family Medicine | Admitting: Family Medicine

## 2013-01-31 DIAGNOSIS — R112 Nausea with vomiting, unspecified: Secondary | ICD-10-CM | POA: Insufficient documentation

## 2013-01-31 DIAGNOSIS — R1011 Right upper quadrant pain: Secondary | ICD-10-CM

## 2013-01-31 DIAGNOSIS — R109 Unspecified abdominal pain: Secondary | ICD-10-CM | POA: Insufficient documentation

## 2013-01-31 MED ORDER — TECHNETIUM TC 99M MEBROFENIN IV KIT
5.4000 | PACK | Freq: Once | INTRAVENOUS | Status: AC | PRN
  Administered 2013-01-31: 5 via INTRAVENOUS

## 2013-12-17 ENCOUNTER — Encounter: Payer: Self-pay | Admitting: Advanced Practice Midwife

## 2014-01-21 ENCOUNTER — Emergency Department (HOSPITAL_COMMUNITY): Payer: 59

## 2014-01-21 ENCOUNTER — Encounter (HOSPITAL_COMMUNITY): Payer: Self-pay | Admitting: Emergency Medicine

## 2014-01-21 ENCOUNTER — Emergency Department (HOSPITAL_COMMUNITY)
Admission: EM | Admit: 2014-01-21 | Discharge: 2014-01-21 | Disposition: A | Discharge status: Home / Self Care | Payer: 59 | Attending: Emergency Medicine | Admitting: Emergency Medicine

## 2014-01-21 DIAGNOSIS — Z87448 Personal history of other diseases of urinary system: Secondary | ICD-10-CM | POA: Diagnosis not present

## 2014-01-21 DIAGNOSIS — Z87442 Personal history of urinary calculi: Secondary | ICD-10-CM | POA: Diagnosis not present

## 2014-01-21 DIAGNOSIS — R1011 Right upper quadrant pain: Secondary | ICD-10-CM | POA: Diagnosis present

## 2014-01-21 DIAGNOSIS — Z720 Tobacco use: Secondary | ICD-10-CM | POA: Insufficient documentation

## 2014-01-21 DIAGNOSIS — Z3202 Encounter for pregnancy test, result negative: Secondary | ICD-10-CM | POA: Insufficient documentation

## 2014-01-21 DIAGNOSIS — R110 Nausea: Secondary | ICD-10-CM | POA: Diagnosis not present

## 2014-01-21 LAB — CBC WITH DIFFERENTIAL/PLATELET
Basophils Absolute: 0 10*3/uL (ref 0.0–0.1)
Basophils Relative: 1 % (ref 0–1)
Eosinophils Absolute: 0.1 10*3/uL (ref 0.0–0.7)
Eosinophils Relative: 2 % (ref 0–5)
HCT: 36.1 % (ref 36.0–46.0)
Hemoglobin: 11.2 g/dL — ABNORMAL LOW (ref 12.0–15.0)
Lymphocytes Relative: 33 % (ref 12–46)
Lymphs Abs: 2.1 10*3/uL (ref 0.7–4.0)
MCH: 28.9 pg (ref 26.0–34.0)
MCHC: 31 g/dL (ref 30.0–36.0)
MCV: 93 fL (ref 78.0–100.0)
Monocytes Absolute: 0.4 10*3/uL (ref 0.1–1.0)
Monocytes Relative: 7 % (ref 3–12)
Neutro Abs: 3.6 10*3/uL (ref 1.7–7.7)
Neutrophils Relative %: 57 % (ref 43–77)
Platelets: 285 10*3/uL (ref 150–400)
RBC: 3.88 MIL/uL (ref 3.87–5.11)
RDW: 13.6 % (ref 11.5–15.5)
WBC: 6.2 10*3/uL (ref 4.0–10.5)

## 2014-01-21 LAB — COMPREHENSIVE METABOLIC PANEL
ALT: 8 U/L (ref 0–35)
AST: 24 U/L (ref 0–37)
Albumin: 3.9 g/dL (ref 3.5–5.2)
Alkaline Phosphatase: 39 U/L (ref 39–117)
Anion gap: 13 (ref 5–15)
BUN: 14 mg/dL (ref 6–23)
CO2: 24 mEq/L (ref 19–32)
Calcium: 9.2 mg/dL (ref 8.4–10.5)
Chloride: 102 mEq/L (ref 96–112)
Creatinine, Ser: 0.71 mg/dL (ref 0.50–1.10)
GFR calc Af Amer: >90 mL/min (ref >90)
GFR calc non Af Amer: >90 mL/min (ref >90)
Glucose, Bld: 81 mg/dL (ref 70–99)
Potassium: 4.8 mEq/L (ref 3.7–5.3)
Sodium: 139 mEq/L (ref 137–147)
Total Bilirubin: 0.3 mg/dL (ref 0.3–1.2)
Total Protein: 7.5 g/dL (ref 6.0–8.3)

## 2014-01-21 LAB — PREGNANCY, URINE: Preg Test, Ur: NEGATIVE

## 2014-01-21 LAB — URINALYSIS, ROUTINE W REFLEX MICROSCOPIC
Bilirubin Urine: NEGATIVE
Glucose, UA: NEGATIVE mg/dL
Hgb urine dipstick: NEGATIVE
Ketones, ur: NEGATIVE mg/dL
Leukocytes, UA: NEGATIVE
Nitrite: NEGATIVE
Protein, ur: NEGATIVE mg/dL
Specific Gravity, Urine: 1.019 (ref 1.005–1.030)
Urobilinogen, UA: 1 mg/dL (ref 0.0–1.0)
pH: 6 (ref 5.0–8.0)

## 2014-01-21 LAB — LIPASE, BLOOD: Lipase: 30 U/L (ref 11–59)

## 2014-01-21 MED ORDER — ONDANSETRON HCL 4 MG/2ML IJ SOLN
4.0000 mg | Freq: Once | INTRAMUSCULAR | Status: AC
  Administered 2014-01-21: 4 mg via INTRAVENOUS
  Filled 2014-01-21: qty 2

## 2014-01-21 MED ORDER — TRAMADOL HCL 50 MG PO TABS: 50.0000 mg | ORAL_TABLET | Freq: Four times a day (QID) | ORAL | Status: DC | PRN

## 2014-01-21 MED ORDER — MORPHINE SULFATE 4 MG/ML IJ SOLN
4.0000 mg | Freq: Once | INTRAMUSCULAR | Status: AC
  Administered 2014-01-21: 4 mg via INTRAVENOUS
  Filled 2014-01-21: qty 1

## 2014-01-21 MED ORDER — ONDANSETRON HCL 4 MG PO TABS: 4.0000 mg | ORAL_TABLET | Freq: Four times a day (QID) | ORAL | Status: DC

## 2014-01-21 MED ORDER — HYDROCODONE-ACETAMINOPHEN 5-325 MG PO TABS: 1.0000 | ORAL_TABLET | ORAL | Status: DC | PRN

## 2014-01-21 NOTE — ED Provider Notes (Signed)
Patient handed off from [name], [place].  "This is a 33 year old female with past medical history significant for kidney stones, presenting to the ED for abdominal pain. Patient states she has had intermittent right upper quadrant pain for the past month, worse over the past week. States pain is worse after eating, states it occurs independent of Food. She endorses associated nausea but denies vomiting. She denies any chest pain or shortness of breath. No flank pain, dysuria, or hematuria. No vaginal complaints. Patient does have history of gallbladder issues in the past, she had a HIDA scan performed which she states was abnormal, but not to the point of needing surgery. Patient has never been evaluated by a general surgeon for cholecystectomy. She denies any fever or chills. No intervention tried prior to arrival" -- Tiffany SitesLisa Sanders, PA-C  Patient is currently waiting for US of the abdomen to evaluate abdominal pain.  Results for orders placed or performed during the hospital encounter of 01/21/14  CBC with Differential  Result Value Ref Range   WBC 6.2 4.0 - 10.5 K/uL   RBC 3.88 3.87 - 5.11 MIL/uL   Hemoglobin 11.2 (L) 12.0 - 15.0 g/dL   HCT 16.136.1 09.636.0 - 04.546.0 %   MCV 93.0 78.0 - 100.0 fL   MCH 28.9 26.0 - 34.0 pg   MCHC 31.0 30.0 - 36.0 g/dL   RDW 40.913.6 81.111.5 - 91.415.5 %   Platelets 285 150 - 400 K/uL   Neutrophils Relative % 57 43 - 77 %   Neutro Abs 3.6 1.7 - 7.7 K/uL   Lymphocytes Relative 33 12 - 46 %   Lymphs Abs 2.1 0.7 - 4.0 K/uL   Monocytes Relative 7 3 - 12 %   Monocytes Absolute 0.4 0.1 - 1.0 K/uL   Eosinophils Relative 2 0 - 5 %   Eosinophils Absolute 0.1 0.0 - 0.7 K/uL   Basophils Relative 1 0 - 1 %   Basophils Absolute 0.0 0.0 - 0.1 K/uL  Comprehensive metabolic panel  Result Value Ref Range   Sodium 139 137 - 147 mEq/L   Potassium 4.8 3.7 - 5.3 mEq/L   Chloride 102 96 - 112 mEq/L   CO2 24 19 - 32 mEq/L   Glucose, Bld 81 70 - 99 mg/dL   BUN 14 6 - 23 mg/dL   Creatinine,  Ser 7.820.71 0.50 - 1.10 mg/dL   Calcium 9.2 8.4 - 95.610.5 mg/dL   Total Protein 7.5 6.0 - 8.3 g/dL   Albumin 3.9 3.5 - 5.2 g/dL   AST 24 0 - 37 U/L   ALT 8 0 - 35 U/L   Alkaline Phosphatase 39 39 - 117 U/L   Total Bilirubin 0.3 0.3 - 1.2 mg/dL   GFR calc non Af Amer >90 >90 mL/min   GFR calc Af Amer >90 >90 mL/min   Anion gap 13 5 - 15  Urinalysis, Routine w reflex microscopic  Result Value Ref Range   Color, Urine YELLOW YELLOW   APPearance CLEAR CLEAR   Specific Gravity, Urine 1.019 1.005 - 1.030   pH 6.0 5.0 - 8.0   Glucose, UA NEGATIVE NEGATIVE mg/dL   Hgb urine dipstick NEGATIVE NEGATIVE   Bilirubin Urine NEGATIVE NEGATIVE   Ketones, ur NEGATIVE NEGATIVE mg/dL   Protein, ur NEGATIVE NEGATIVE mg/dL   Urobilinogen, UA 1.0 0.0 - 1.0 mg/dL   Nitrite NEGATIVE NEGATIVE   Leukocytes, UA NEGATIVE NEGATIVE  Pregnancy, urine  Result Value Ref Range   Preg Test, Ur NEGATIVE  NEGATIVE  Lipase, blood  Result Value Ref Range   Lipase 30 11 - 59 U/L   [place] Abdomen Complete  01/21/2014   CLINICAL DATA:  Right upper quadrant pain, worse after eating  EXAM: ULTRASOUND ABDOMEN COMPLETE  COMPARISON:  01/15/2013  FINDINGS: Gallbladder: No gallstones or wall thickening visualized. No sonographic Murphy sign noted.  Common bile duct: Diameter: 3 Mm.  Liver: No focal lesion identified. Within normal limits in parenchymal echogenicity.  IVC: No abnormality visualized.  Pancreas: Poorly visualized due to overlying bowel gas.  Spleen: Size and appearance within normal limits.  Right Kidney: Length: 11.6 cm. Echogenicity within normal limits. No mass or hydronephrosis visualized.  Left Kidney: Length: 11.3 cm. Echogenicity within normal limits. No mass or hydronephrosis visualized.  Abdominal aorta: No aneurysm visualized.  Other findings: None.  IMPRESSION: No acute abnormality noted.   Electronically Signed   By: Alcide CleverMark  Lukens M.D.   On: 01/21/2014 15:49     No acute abnormality noted on US. Patient is feeling  well. [facility] She requests Ultram for pain and Zofran for nausea.  33 y.o.[name]'s evaluation in the Emergency Department is complete. It has been determined that no acute conditions requiring further emergency intervention are present at this time. The patient/guardian have been advised of the diagnosis and plan. We have discussed signs and symptoms that warrant return to the ED, such as changes or worsening in symptoms.  Vital signs are stable at discharge. Filed Vitals:   01/21/14 1536  BP: 93/44  Pulse: 65  Temp:   Resp: 16    Patient/guardian has voiced understanding and agreed to follow-up with the PCP or specialist.   Tiffany Matasiffany G Amberli Ruegg, PA-C 01/21/14 1642  Linwood DibblesJon Knapp, MD 01/21/14 1649

## 2014-01-21 NOTE — ED Notes (Signed)
Bed: [id number] Expected date: 01/21/14 Expected time:  Means of arrival:  Comments: Hold for hall D

## 2014-01-21 NOTE — ED Notes (Signed)
Per patient, states RUQ pain for about a week- increased pain today-pain after she ate, also has history of kidney stones, no dysuria

## 2014-01-21 NOTE — Discharge Instructions (Signed)
Biliary Colic  °Biliary colic is a steady or irregular pain in the upper abdomen. It is usually under the right side of the rib cage. It happens when gallstones interfere with the normal flow of bile from the gallbladder. Bile is a liquid that helps to digest fats. Bile is made in the liver and stored in the gallbladder. When you eat a meal, bile passes from the gallbladder through the cystic duct and the common bile duct into the small intestine. There, it mixes with partially digested food. If a gallstone blocks either of these ducts, the normal flow of bile is blocked. The muscle cells in the bile duct contract forcefully to try to move the stone. This causes the pain of biliary colic.  °SYMPTOMS  °· A person with biliary colic usually complains of pain in the upper abdomen. This pain can be: °¨ In the center of the upper abdomen just below the breastbone. °¨ In the upper-right part of the abdomen, near the gallbladder and liver. °¨ Spread back toward the right shoulder blade. °· Nausea and vomiting. °· The pain usually occurs after eating. °· Biliary colic is usually triggered by the digestive system's demand for bile. The demand for bile is high after fatty meals. Symptoms can also occur when a person who has been fasting suddenly eats a very large meal. Most episodes of biliary colic pass after 1 to 5 hours. After the most intense pain passes, your abdomen may continue to ache mildly for about 24 hours. °DIAGNOSIS  °After you describe your symptoms, your caregiver will perform a physical exam. He or she will pay attention to the upper right portion of your belly (abdomen). This is the area of your liver and gallbladder. An ultrasound will help your caregiver look for gallstones. Specialized scans of the gallbladder may also be done. Blood tests may be done, especially if you have fever or if your pain persists. °PREVENTION  °Biliary colic can be prevented by controlling the risk factors for gallstones. Some of  these risk factors, such as heredity, increasing age, and pregnancy are a normal part of life. Obesity and a high-fat diet are risk factors you can change through a healthy lifestyle. Women going through menopause who take hormone replacement therapy (estrogen) are also more likely to develop biliary colic. °TREATMENT  °· Pain medication may be prescribed. °· You may be encouraged to eat a fat-free diet. °· If the first episode of biliary colic is severe, or episodes of colic keep retuning, surgery to remove the gallbladder (cholecystectomy) is usually recommended. This procedure can be done through small incisions using an instrument called a laparoscope. The procedure often requires a brief stay in the hospital. Some people can leave the hospital the same day. It is the most widely used treatment in people troubled by painful gallstones. It is effective and safe, with no complications in more than 90% of cases. °· If surgery cannot be done, medication that dissolves gallstones may be used. This medication is expensive and can take months or years to work. Only small stones will dissolve. °· Rarely, medication to dissolve gallstones is combined with a procedure called shock-wave lithotripsy. This procedure uses carefully aimed shock waves to break up gallstones. In many people treated with this procedure, gallstones form again within a few years. °PROGNOSIS  °If gallstones block your cystic duct or common bile duct, you are at risk for repeated episodes of biliary colic. There is also a 25% chance that you will develop   a gallbladder infection(acute cholecystitis), or some other complication of gallstones within 10 to 20 years. If you have surgery, schedule it at a time that is convenient for you and at a time when you are not sick. °HOME CARE INSTRUCTIONS  °· Drink plenty of clear fluids. °· Avoid fatty, greasy or fried foods, or any foods that make your pain worse. °· Take medications as directed. °SEEK MEDICAL  CARE IF:  °· You develop a fever over 100.5° F (38.1° C). °· Your pain gets worse over time. °· You develop nausea that prevents you from eating and drinking. °· You develop vomiting. °SEEK IMMEDIATE MEDICAL CARE IF:  °· You have continuous or severe belly (abdominal) pain which is not relieved with medications. °· You develop nausea and vomiting which is not relieved with medications. °· You have symptoms of biliary colic and you suddenly develop a fever and shaking chills. This may signal cholecystitis. Call your caregiver immediately. °· You develop a yellow color to your skin or the white part of your eyes (jaundice). °Document Released: 07/05/2005 Document Revised: 04/26/2011 Document Reviewed: 09/14/2007 °ExitCare® Patient Information ©2015 [employer]. This information is not intended to replace advice given to you by your health care provider. Make sure you discuss any questions you have with your health care provider. ° °

## 2014-01-21 NOTE — ED Provider Notes (Signed)
CSN: [id number]     Arrival date & time 01/21/14  1151 History   First MD Initiated Contact with Patient 01/21/14 1203     Chief Complaint  Patient presents with  . Abdominal Pain     (Consider location/radiation/quality/duration/timing/severity/associated sxs/prior Treatment) Patient is a 33 y.o. female presenting with abdominal pain. The history is provided by the patient and medical records.  Abdominal Pain Associated symptoms: nausea     This is a 33 year old female with past medical history significant for kidney stones, presenting to the ED for abdominal pain. Patient states she has had intermittent right upper quadrant pain for the past month, worse over the past week. States pain is worse after eating, states it occurs independent of Food. She endorses associated nausea but denies vomiting. She denies any chest pain or shortness of breath. No flank pain, dysuria, or hematuria. No vaginal complaints. Patient does have history of gallbladder issues in the past, she had a HIDA scan performed which she states was abnormal, but not to the point of needing surgery.  Patient has never been evaluated by a general surgeon for cholecystectomy. She denies any fever or chills. No intervention tried prior to arrival.  Past Medical History  Diagnosis Date  . Renal disorder    History reviewed. No pertinent past surgical history. No family history on file. History  Substance Use Topics  . Smoking status: Current Every Day Smoker -- 0.25 packs/day  . Smokeless tobacco: Not on file  . Alcohol Use: No   OB History    Gravida Para Term Preterm AB TAB SAB Ectopic Multiple Living   3 3 3  0 0 0 0 0 0 3     Review of Systems  Gastrointestinal: Positive for nausea and abdominal pain.  All other systems reviewed and are negative.  Allergies  Review of patient's allergies indicates no known allergies.  Home Medications   Prior to Admission medications   Not on File   BP 141/80 mmHg  Pulse  85  Temp(Src) 97.6 F (36.4 C) (Oral)  Resp 16  SpO2 100%   Physical Exam  Constitutional: She is oriented to person, place, and time. She appears well-developed and well-nourished. No distress.  HENT:  Head: Normocephalic and atraumatic.  Mouth/Throat: Oropharynx is clear and moist.  Eyes: Conjunctivae and EOM are normal. Pupils are equal, round, and reactive to light.  Neck: Normal range of motion. Neck supple.  Cardiovascular: Normal rate, regular rhythm and normal heart sounds.   Pulmonary/Chest: Effort normal and breath sounds normal. No respiratory distress. She has no wheezes.  Abdominal: Soft. Bowel sounds are normal. There is tenderness in the right upper quadrant. There is no guarding, no CVA tenderness and negative Murphy's sign.  Abdomen soft, nondistended, tenderness in right upper quadrant without Murphy's sign  Musculoskeletal: Normal range of motion.  Neurological: She is alert and oriented to person, place, and time.  Skin: Skin is warm and dry. She is not diaphoretic.  Psychiatric: She has a normal mood and affect.  Nursing note and vitals reviewed.   ED Course  Procedures (including critical care time) Labs Review Labs Reviewed  CBC WITH DIFFERENTIAL - Abnormal; Notable for the following:    Hemoglobin 11.2 (*)    All other components within normal limits  COMPREHENSIVE METABOLIC PANEL  URINALYSIS, ROUTINE W REFLEX MICROSCOPIC  PREGNANCY, URINE  LIPASE, BLOOD    Imaging Review No results found.   EKG Interpretation None      MDM  Final diagnoses:  RUQ pain   33 year old female with intermittent right upper quadrant pain over the past month, worse over the past week. Associated postprandial nausea and vomiting.  On exam, patient afebrile and nontoxic in appearance. She is in no acute distress. She has some tenderness in her right upper quadrant without Murphy's sign. She does have history of gallbladder issues in the past. Last imaging was  partially 1 year ago. Will plan for labs, abdominal ultrasound.  Labwork overall reassuring-- lipase still pending.  3:45 PM  Care signed out to PA Greene at shift change with ultrasound pending.  If negative or no findings warranting immediate surgical consultation, feel patient can be discharged with symptomatic control and OP general surgery follow-up.  Garlon HatchetLisa M Rania Prothero, PA-C 01/21/14 1555  Tilden FossaElizabeth Rees, MD 01/21/14 [phone]

## 2014-01-29 ENCOUNTER — Encounter (HOSPITAL_COMMUNITY): Payer: Self-pay | Admitting: Emergency Medicine

## 2014-01-29 ENCOUNTER — Emergency Department (HOSPITAL_COMMUNITY)
Admission: EM | Admit: 2014-01-29 | Discharge: 2014-01-29 | Disposition: A | Discharge status: Home / Self Care | Payer: 59 | Attending: Emergency Medicine | Admitting: Emergency Medicine

## 2014-01-29 ENCOUNTER — Emergency Department (HOSPITAL_COMMUNITY): Payer: 59

## 2014-01-29 DIAGNOSIS — Z87442 Personal history of urinary calculi: Secondary | ICD-10-CM | POA: Diagnosis not present

## 2014-01-29 DIAGNOSIS — R109 Unspecified abdominal pain: Secondary | ICD-10-CM | POA: Diagnosis present

## 2014-01-29 DIAGNOSIS — R1013 Epigastric pain: Secondary | ICD-10-CM | POA: Diagnosis not present

## 2014-01-29 DIAGNOSIS — R1011 Right upper quadrant pain: Secondary | ICD-10-CM | POA: Diagnosis not present

## 2014-01-29 DIAGNOSIS — Z720 Tobacco use: Secondary | ICD-10-CM | POA: Diagnosis not present

## 2014-01-29 DIAGNOSIS — R110 Nausea: Secondary | ICD-10-CM | POA: Diagnosis not present

## 2014-01-29 LAB — CBC WITH DIFFERENTIAL/PLATELET
Basophils Absolute: 0 10*3/uL (ref 0.0–0.1)
Basophils Relative: 0 % (ref 0–1)
Eosinophils Absolute: 0.1 10*3/uL (ref 0.0–0.7)
Eosinophils Relative: 1 % (ref 0–5)
HCT: 41.9 % (ref 36.0–46.0)
Hemoglobin: 13.3 g/dL (ref 12.0–15.0)
Lymphocytes Relative: 36 % (ref 12–46)
Lymphs Abs: 3.4 10*3/uL (ref 0.7–4.0)
MCH: 29.4 pg (ref 26.0–34.0)
MCHC: 31.7 g/dL (ref 30.0–36.0)
MCV: 92.7 fL (ref 78.0–100.0)
Monocytes Absolute: 0.5 10*3/uL (ref 0.1–1.0)
Monocytes Relative: 5 % (ref 3–12)
Neutro Abs: 5.4 10*3/uL (ref 1.7–7.7)
Neutrophils Relative %: 58 % (ref 43–77)
Platelets: 338 10*3/uL (ref 150–400)
RBC: 4.52 MIL/uL (ref 3.87–5.11)
RDW: 13.3 % (ref 11.5–15.5)
WBC: 9.4 10*3/uL (ref 4.0–10.5)

## 2014-01-29 LAB — URINALYSIS, ROUTINE W REFLEX MICROSCOPIC
Bilirubin Urine: NEGATIVE
Glucose, UA: NEGATIVE mg/dL
Hgb urine dipstick: NEGATIVE
Ketones, ur: NEGATIVE mg/dL
Leukocytes, UA: NEGATIVE
Nitrite: NEGATIVE
Protein, ur: NEGATIVE mg/dL
Specific Gravity, Urine: 1.027 (ref 1.005–1.030)
Urobilinogen, UA: 0.2 mg/dL (ref 0.0–1.0)
pH: 5.5 (ref 5.0–8.0)

## 2014-01-29 LAB — COMPREHENSIVE METABOLIC PANEL
ALT: 11 U/L (ref 0–35)
AST: 13 U/L (ref 0–37)
Albumin: 4.3 g/dL (ref 3.5–5.2)
Alkaline Phosphatase: 46 U/L (ref 39–117)
Anion gap: 15 (ref 5–15)
BUN: 12 mg/dL (ref 6–23)
CO2: 23 mEq/L (ref 19–32)
Calcium: 9.8 mg/dL (ref 8.4–10.5)
Chloride: 102 mEq/L (ref 96–112)
Creatinine, Ser: 0.69 mg/dL (ref 0.50–1.10)
GFR calc Af Amer: >90 mL/min (ref >90)
GFR calc non Af Amer: >90 mL/min (ref >90)
Glucose, Bld: 79 mg/dL (ref 70–99)
Potassium: 3.6 mEq/L — ABNORMAL LOW (ref 3.7–5.3)
Sodium: 140 mEq/L (ref 137–147)
Total Bilirubin: 0.2 mg/dL — ABNORMAL LOW (ref 0.3–1.2)
Total Protein: 8 g/dL (ref 6.0–8.3)

## 2014-01-29 LAB — LIPASE, BLOOD: Lipase: 30 U/L (ref 11–59)

## 2014-01-29 LAB — I-STAT BETA HCG BLOOD, ED (MC, WL, AP ONLY): I-stat hCG, quantitative: <5 m[IU]/mL (ref <5)

## 2014-01-29 MED ORDER — FAMOTIDINE IN NACL 20-0.9 MG/50ML-% IV SOLN
20.0000 mg | Freq: Once | INTRAVENOUS | Status: AC
  Administered 2014-01-29: 20 mg via INTRAVENOUS
  Filled 2014-01-29: qty 50

## 2014-01-29 MED ORDER — HYDROCODONE-ACETAMINOPHEN 5-325 MG PO TABS: 1.0000 | ORAL_TABLET | Freq: Four times a day (QID) | ORAL | Status: DC | PRN

## 2014-01-29 MED ORDER — HYDROMORPHONE HCL 1 MG/ML IJ SOLN
0.5000 mg | Freq: Once | INTRAMUSCULAR | Status: AC
  Administered 2014-01-29: 0.5 mg via INTRAVENOUS
  Filled 2014-01-29: qty 1

## 2014-01-29 MED ORDER — ONDANSETRON HCL 4 MG/2ML IJ SOLN
4.0000 mg | Freq: Once | INTRAMUSCULAR | Status: AC
  Administered 2014-01-29: 4 mg via INTRAVENOUS
  Filled 2014-01-29: qty 2

## 2014-01-29 MED ORDER — ONDANSETRON HCL 4 MG PO TABS: 4.0000 mg | ORAL_TABLET | Freq: Four times a day (QID) | ORAL | Status: DC

## 2014-01-29 MED ORDER — GI COCKTAIL ~~LOC~~
30.0000 mL | Freq: Once | ORAL | Status: AC
  Administered 2014-01-29: 30 mL via ORAL
  Filled 2014-01-29: qty 30

## 2014-01-29 MED ORDER — HYDROMORPHONE HCL 1 MG/ML IJ SOLN
1.0000 mg | Freq: Once | INTRAMUSCULAR | Status: AC
  Administered 2014-01-29: 1 mg via INTRAVENOUS
  Filled 2014-01-29: qty 1

## 2014-01-29 MED ORDER — FAMOTIDINE 20 MG PO TABS: 20.0000 mg | ORAL_TABLET | Freq: Two times a day (BID) | ORAL | Status: DC

## 2014-01-29 NOTE — ED Notes (Signed)
Patient-had US Monday here-no gallbladder stones seen. C/o 10/10 pain in right upper abdomen described as "throbbing, sharp, burning." Patient reports pain is constant, with or without eating. Denies more diarrhea than baseline. Denies nausea or vomiting. Denies dysuria. Endorses cold sweats at night. Denies fever. No other complaints/concerns. RR even/unlabored.

## 2014-01-29 NOTE — ED Provider Notes (Signed)
CSN: [id number]     Arrival date & time 01/29/14  1926 History   First MD Initiated Contact with Patient 01/29/14 1938     No chief complaint on file.    (Consider location/radiation/quality/duration/timing/severity/associated sxs/prior Treatment) HPI Comments: The patient is a 33 year old female with a past medical history of renal calculi presenting to emergency room from PCPs office with a chief complaint of persistent, worsening right upper quadrant for over one week. Patient reports right upper quadrant discomfort worsened with food, discomfort described as cramping with radiation into the back.  She reports nausea without emesis. Denies cost patient, diarrhea, blood in stools, dysuria, hematuria. Reports discomfort is not similar with previous renal calculi.  Reports less discomfort with palpation of RUQ during my exam vs. PCP today.  Patient is a 33 y.o. female presenting with abdominal pain. The history is provided by the patient and medical records. No language interpreter was used.  Abdominal Pain Associated symptoms: nausea   Associated symptoms: no chest pain, no chills, no constipation, no diarrhea, no dysuria, no fever, no hematuria, no shortness of breath and no vomiting     Past Medical History  Diagnosis Date  . Renal disorder    No past surgical history on file. No family history on file. History  Substance Use Topics  . Smoking status: Current Every Day Smoker -- 0.25 packs/day  . Smokeless tobacco: Not on file  . Alcohol Use: No   OB History    Gravida Para Term Preterm AB TAB SAB Ectopic Multiple Living   3 3 3  0 0 0 0 0 0 3     Review of Systems  Constitutional: Negative for fever and chills.  Respiratory: Negative for shortness of breath.   Cardiovascular: Negative for chest pain.  Gastrointestinal: Positive for nausea and abdominal pain. Negative for vomiting, diarrhea, constipation and blood in stool.  Genitourinary: Negative for dysuria and hematuria.       Allergies  Morphine and related and Sulfa antibiotics  Home Medications   Prior to Admission medications   Medication Sig Start Date End Date Taking? Authorizing Provider  HYDROcodone-acetaminophen (NORCO/VICODIN) 5-325 MG per tablet Take 1 tablet by mouth every 4 (four) hours as needed for moderate pain or severe pain. 01/21/14   Tiffany Irine SealG Greene, PA-C  ibuprofen (ADVIL,MOTRIN) 200 MG tablet Take 200-400 mg by mouth every 6 (six) hours as needed for headache or moderate pain.    Historical Provider, MD  levonorgestrel (MIRENA) 20 MCG/24HR IUD 1 each by Intrauterine route once. 2013    Historical Provider, MD  ondansetron (ZOFRAN) 4 MG tablet Take 1 tablet (4 mg total) by mouth every 6 (six) hours. 01/21/14   Tiffany Irine SealG Greene, PA-C  traMADol (ULTRAM) 50 MG tablet Take 1 tablet (50 mg total) by mouth every 6 (six) hours as needed. 01/21/14   Dorthula Matasiffany G Greene, PA-C   There were no vitals taken for this visit. Physical Exam  Constitutional: She is oriented to person, place, and time. She appears well-developed and well-nourished. No distress.  HENT:  Head: Normocephalic and atraumatic.  Eyes: No scleral icterus.  Neck: Neck supple.  Cardiovascular: Normal rate and regular rhythm.   Pulmonary/Chest: Effort normal. No respiratory distress.  Abdominal: Soft. She exhibits no distension and no mass. There is tenderness in the right upper quadrant and epigastric area. There is no rebound, no guarding and negative Murphy's sign.  Obese abdomen  Neurological: She is alert and oriented to person, place, and time.  Skin:  Skin is warm and dry. She is not diaphoretic.  Psychiatric: She has a normal mood and affect. Her behavior is normal.  Nursing note and vitals reviewed.   ED Course  Procedures (including critical care time) Labs Review Labs Reviewed  COMPREHENSIVE METABOLIC PANEL - Abnormal; Notable for the following:    Potassium 3.6 (*)    Total Bilirubin 0.2 (*)    All other  components within normal limits  LIPASE, BLOOD  CBC WITH DIFFERENTIAL  URINALYSIS, ROUTINE W REFLEX MICROSCOPIC  I-STAT BETA HCG BLOOD, ED ([facility], [facility], [facility] ONLY)    Imaging Review [place] Abdomen Limited Ruq  01/29/2014   CLINICAL DATA:  RIGHT upper quadrant pain and discomfort.  EXAM: US ABDOMEN LIMITED - RIGHT UPPER QUADRANT  COMPARISON:  01/21/2014.  Initial encounter.  FINDINGS: Gallbladder:  No gallstones or wall thickening visualized. No sonographic Murphy sign noted.  Common bile duct:  Diameter: 5 mm common normal.  Liver:  No focal lesion identified. Within normal limits in parenchymal echogenicity.  IMPRESSION: Negative RIGHT upper quadrant ultrasound.   Electronically Signed   By: Andreas NewportGeoffrey  Lamke M.D.   On: 01/29/2014 21:00     EKG Interpretation None      MDM   Final diagnoses:  RUQ discomfort  Nausea without vomiting   Patient presents with right upper quadrant pain, worsened from 1 week when she was evaluated with a negative abdominal ultrasound. Afebrile. Labs ordered, pain, fluids, nausea medication given. Labs unremarkable, ultrasound without signs of gallstones, infection. Patient has a negative HIDA scan 1 year ago. Questionable peptic ulcer disease, plan to treat with Pepcid and follow-up with GI. 9:37 PM Reevaluation patient resting complain room. Reports persistent right upper quadrant discomfort. Discussed negative ultrasound, labs with the patient. Discussed possible treatment for PUD or reflux. Patient agrees to plan. Discussed follow-up with GI, general surgeon as needed. Return precautions given. Reports understanding and no other concerns at this time.  Patient is stable for discharge at this time. Meds given in ED:  Medications  HYDROmorphone (DILAUDID) injection 0.5 mg (0.5 mg Intravenous Given 01/29/14 2012)  ondansetron (ZOFRAN) injection 4 mg (4 mg Intravenous Given 01/29/14 2012)  famotidine (PEPCID) IVPB 20 mg (0 mg Intravenous Stopped 01/29/14 2045)   HYDROmorphone (DILAUDID) injection 1 mg (1 mg Intravenous Given 01/29/14 2119)  gi cocktail (Maalox,Lidocaine,Donnatal) (30 mLs Oral Given 01/29/14 2141)    New Prescriptions   FAMOTIDINE (PEPCID) 20 MG TABLET    Take 1 tablet (20 mg total) by mouth 2 (two) times daily.   HYDROCODONE-ACETAMINOPHEN (NORCO/VICODIN) 5-325 MG PER TABLET    Take 1 tablet by mouth every 6 (six) hours as needed for moderate pain or severe pain.   ONDANSETRON (ZOFRAN) 4 MG TABLET    Take 1 tablet (4 mg total) by mouth every 6 (six) hours.     Mellody DrownLauren Jannat Rosemeyer, PA-C 01/29/14 [id number]  Audree CamelScott T Goldston, MD 01/30/14 [phone]

## 2014-01-29 NOTE — Discharge Instructions (Signed)
Your workup today for your right upper quadrant discomfort has been unrevealing as to a source of your persistent discomfort. Your discomfort may be coming from a possible ulcer. Plan to treat with an antacid medication and pain medication. Follow-up with a GI specialist.

## 2014-02-12 ENCOUNTER — Other Ambulatory Visit (INDEPENDENT_AMBULATORY_CARE_PROVIDER_SITE_OTHER): Payer: Self-pay | Admitting: Surgery

## 2014-02-12 NOTE — H&P (Signed)
Mollie GermanyJennifer Belizaire 02/12/2014 10:49 AM Location: [facility] Patient #: [id number] DOB: 1980/02/17 Married / Language: [place] / Race: White Female History of Present Illness Ardeth Sportsman([place]ak[place] Esther C. Dora Simeone MD; 02/12/2014 1:30 PM) Patient words: gallbladder.  The patient is a 33 year old female who presents with abdominal pain. Patient sent for surgical consultation by Dr. Chilton SiGreen: Health emergency Department. Concern for abdominal pain. Pleasant morbidly obese female that has struggled with intermittent abdominal pains in the past few years. Usually right upper quadrant severe pain. Usually starts about an hour after eating. Usually heavier meals. Often feels pain pain pushing into the back and shoulders. Has felt nauseated. Had workup last year. No gallstones. Nuclear medicine study showed borderline normal gallbladder ejection fraction. No comment on reproduction of symptoms. Patient recalls had severe nausea vomiting and pain a few hours after the study.  Had gastroenterology workup. Some mild esophagitis and gastritis noted. Placed on proton pump inhibitor. Still had persistent attacks. Switch to ranitidine. Switch diet to milder diet. Less attacks but still occurred. Now attacks happening more frequently and intensely. Therefore saw primary care physician. Severe pain noted. [facility]. No cholecystitis. Surgical consultation requested. Patient walk several miles without difficulty. Usually has bowel movement every day. Occasional loose stools. No irritable bowel syndrome. No Crohn's. No stricture colitis. No sick contacts. No travel history. No gluten or dairy allergies. No hematochezia. No melena. No coffee-ground emesis. No hematemesis. No chest pain with exertion. No history of fall or trauma. Other Problems Gilmer Mor(Sonya Bynum, CMA; 02/12/2014 10:49 AM) Kidney Stone Migraine Headache  Past Surgical History Gilmer Mor(Sonya Bynum, CMA; 02/12/2014 10:49 AM) Oral  Surgery  Diagnostic Studies History Gilmer Mor(Sonya Bynum, CMA; 02/12/2014 10:49 AM) Mammogram never Pap Smear 1-5 years ago  Allergies Lamar Laundry(Sonya Bynum, CMA; 02/12/2014 10:51 AM) Morphine Sulfate (Concentrate) *ANALGESICS - OPIOID* Sulfa Antibiotics  Medication History (Sonya Bynum, CMA; 02/12/2014 10:52 AM) Ondansetron HCl (4MG  Tablet, Oral as needed) Active. Mirena (20MCG/24HR IUD, Intrauterine) Active. Advil (200MG  Capsule, Oral as needed) Active. TraMADol HCl (50MG  Tablet, Oral as needed) Active.  Social History Gilmer Mor(Sonya Bynum, CMA; 02/12/2014 10:49 AM) Caffeine use Carbonated beverages. Illicit drug use Remotely quit drug use. No alcohol use Tobacco use Former smoker.  Family History Gilmer Mor(Sonya Bynum, CMA; 02/12/2014 10:49 AM) Alcohol Abuse Father, Mother. Arthritis Mother. Depression Mother. Heart disease in female family member before age 35 Hypertension Father, Mother. Respiratory Condition Mother. Thyroid problems Mother.  Pregnancy / Birth History Gilmer Mor(Sonya Bynum, CMA; 02/12/2014 10:49 AM) Age at menarche 11 years. Contraceptive History Intrauterine device. Maternal age 35-20 Para 3 Regular periods     Review of Systems Lamar Laundry(Sonya Bynum CMA; 02/12/2014 10:49 AM) General Present- Chills, Fatigue and Night Sweats. Not Present- Appetite Loss, Fever, Weight Gain and Weight Loss. Skin Present- Dryness. Not Present- Change in Wart/Mole, Hives, Jaundice, New Lesions, Non-Healing Wounds, Rash and Ulcer. HEENT Not Present- Earache, Hearing Loss, Hoarseness, Nose Bleed, Oral Ulcers, Ringing in the Ears, Seasonal Allergies, Sinus Pain, Sore Throat, Visual Disturbances, Wears glasses/contact lenses and Yellow Eyes. Respiratory Not Present- Bloody sputum, Chronic Cough, Difficulty Breathing, Snoring and Wheezing. Cardiovascular Not Present- Chest Pain, Difficulty Breathing Lying Down, Leg Cramps, Palpitations, Rapid Heart Rate, Shortness of Breath and Swelling of  Extremities. Gastrointestinal Present- Abdominal Pain, Bloating and Nausea. Not Present- Bloody Stool, Change in Bowel Habits, Chronic diarrhea, Constipation, Difficulty Swallowing, Excessive gas, Gets full quickly at meals, Hemorrhoids, Indigestion, Rectal Pain and Vomiting. Female Genitourinary Not Present- Frequency, Nocturia, Painful Urination, Pelvic Pain and Urgency. Musculoskeletal Not Present- Back Pain,  Joint Pain, Joint Stiffness, Muscle Pain, Muscle Weakness and Swelling of Extremities. Neurological Present- Headaches. Not Present- Decreased Memory, Fainting, Numbness, Seizures, Tingling, Tremor, Trouble walking and Weakness. Psychiatric Not Present- Anxiety, Bipolar, Change in Sleep Pattern, Depression, Fearful and Frequent crying. Endocrine Present- Hair Changes. Not Present- Cold Intolerance, Excessive Hunger, Heat Intolerance, Hot flashes and New Diabetes. Hematology Not Present- Easy Bruising, Excessive bleeding, Gland problems, HIV and Persistent Infections.  Vitals (Sonya Bynum CMA; 02/12/2014 10:50 AM) 02/12/2014 10:50 AM Weight: 266 lb Height: 63in Body Surface Area: 2.32 m Body Mass Index: 47.12 kg/m Temp.: 27F(Temporal)  Pulse: 77 (Regular)  BP: 132/72 (Sitting, Left Arm, Standard)     Physical Exam Ardeth Sportsman(Temperence Zenor C. Lyric Hoar MD; 02/12/2014 1:31 PM)  General Mental Status-Alert. General Appearance-Not in acute distress, Not Sickly. Orientation-Oriented X3. Hydration-Well hydrated. Voice-Normal.  Integumentary Global Assessment Upon inspection and palpation of skin surfaces of the - Axillae: non-tender, no inflammation or ulceration, no drainage. and Distribution of scalp and body hair is normal. General Characteristics Temperature - normal warmth is noted.  Head and Neck Head-normocephalic, atraumatic with no lesions or palpable masses. Face Global Assessment - atraumatic, no absence of expression. Neck Global Assessment - no abnormal  movements, no bruit auscultated on the right, no bruit auscultated on the left, no decreased range of motion, non-tender. Trachea-midline. Thyroid Gland Characteristics - non-tender.  Eye Eyeball - Left-Extraocular movements intact, No Nystagmus. Eyeball - Right-Extraocular movements intact, No Nystagmus. Cornea - Left-No Hazy. Cornea - Right-No Hazy. Sclera/Conjunctiva - Left-No scleral icterus, No Discharge. Sclera/Conjunctiva - Right-No scleral icterus, No Discharge. Pupil - Left-Direct reaction to light normal. Pupil - Right-Direct reaction to light normal.  ENMT Ears Pinna - Left - no drainage observed, no generalized tenderness observed. Right - no drainage observed, no generalized tenderness observed. Nose and Sinuses External Inspection of the Nose - no destructive lesion observed. Inspection of the nares - Left - quiet respiration. Right - quiet respiration. Mouth and Throat Lips - Upper Lip - no fissures observed, no pallor noted. Lower Lip - no fissures observed, no pallor noted. Nasopharynx - no discharge present. Oral Cavity/Oropharynx - Tongue - no dryness observed. Oral Mucosa - no cyanosis observed. Hypopharynx - no evidence of airway distress observed.  Chest and Lung Exam Inspection Movements - Normal and Symmetrical. Accessory muscles - No use of accessory muscles in breathing. Palpation Palpation of the chest reveals - Non-tender. Auscultation Breath sounds - Normal and Clear.  Cardiovascular Auscultation Rhythm - Regular. Murmurs & Other Heart Sounds - Auscultation of the heart reveals - No Murmurs and No Systolic Clicks.  Abdomen Inspection Inspection of the abdomen reveals - No Visible peristalsis and No Abnormal pulsations. Umbilicus - No Bleeding, No Urine drainage. Palpation/Percussion Palpation and Percussion of the abdomen reveal - Soft, Non Tender, No Rebound tenderness, No Rigidity (guarding) and No Cutaneous  hyperesthesia. Note: Morbidly obese but soft. Mild tenderness in right upper quadrant over gallbladder. No masses. No Murphy sign. No hernias.   Female Genitourinary Sexual Maturity Tanner 5 - Adult hair pattern. Note: No vaginal bleeding nor discharge   Peripheral Vascular Upper Extremity Inspection - Left - No Cyanotic nailbeds, Not Ischemic. Right - No Cyanotic nailbeds, Not Ischemic.  Neurologic Neurologic evaluation reveals -normal attention span and ability to concentrate, able to name objects and repeat phrases. Appropriate fund of knowledge , normal sensation and normal coordination. Mental Status Affect - not angry, not paranoid. Cranial Nerves-Normal Bilaterally. Gait-Normal.  Neuropsychiatric Mental status exam performed with findings of-able to  articulate well with normal speech/language, rate, volume and coherence, thought content normal with ability to perform basic computations and apply abstract reasoning and no evidence of hallucinations, delusions, obsessions or homicidal/suicidal ideation.  Musculoskeletal Global Assessment Spine, Ribs and Pelvis - no instability, subluxation or laxity. Right Upper Extremity - no instability, subluxation or laxity.  Lymphatic Head & Neck  General Head & Neck Lymphatics: Bilateral - Description - No Localized lymphadenopathy. Axillary  General Axillary Region: Bilateral - Description - No Localized lymphadenopathy. Femoral & Inguinal  Generalized Femoral & Inguinal Lymphatics: Left - Description - No Localized lymphadenopathy. Right - Description - No Localized lymphadenopathy.    Assessment & Plan Ardeth Sportsman MD; 02/12/2014 1:32 PM)  CHRONIC CHOLECYSTITIS WITHOUT CALCULUS (575.11  K81.1) Impression: Story very classic for biliary colic. No evidence of stones. Borderline gallbladder ejection fraction with reproduction of symptoms suspicious for biliary dyskinesia. No improvement in preventing attacks  despite different and acid blockade regiments. Mild esophagitis/gastritis on endoscopy. Treated with Nexium. Still having attacks.  Rest of differential diagnosis unlikely. Story not consistent with delayed gastric emptying. Doubt much benefit of obtaining gastric emptying study in the absence of bloating heartburn and reflux or constipation. She is not a diabetic.  In the past she has stated having surgery. However because the attacks are becoming more intense and more frequent, she is more convinced to have surgery. Reasonable to do single site approach.  She confesses she had a problem with dependent/addiction with narcotics in the past and wishes to minimize them. Her husband will hold any prescription should it be needed. I noted, believer in maximizing nonnarcotic pain control but I cannot guarantee she can avoid narcotics throughout this procedure/recovery. Try alternatives such as robaxin & tramadol  Current Plans Schedule for Surgery Pt Education - [facility] Good Bowel Health (Leory Allinson) Pt Education - [facility] Pain Control (Kahlen Morais) Pt Education - [facility] Laparosopic Post Op HCI (J[place]ica Inthavong)  Ardeth Sportsman, M.D., F.A.C.S. Gastrointestinal and Minimally Invasive Surgery [facility] [address], [address] [place], [place] [address] [phone] Main / Paging

## 2014-03-22 ENCOUNTER — Other Ambulatory Visit: Payer: Self-pay | Admitting: Surgery

## 2014-05-18 ENCOUNTER — Telehealth: Payer: Self-pay | Admitting: General Surgery

## 2014-05-18 NOTE — Telephone Encounter (Signed)
Patient called stating worsening umbilical pain over the last couple days. She describes this as soreness with movement and palpation. She is two months status post laparoscopic cholecystectomy. I instructed her to use ice and heat on a rotational basis along with ibuprofen for the next 48 hours. If no change in symptoms, she should call the office for a follow-up appointment with Dr. Michaell CowingGross to make sure she does not have a hernia.

## 2015-04-21 ENCOUNTER — Other Ambulatory Visit: Payer: Self-pay | Admitting: General Surgery

## 2015-04-21 DIAGNOSIS — R222 Localized swelling, mass and lump, trunk: Secondary | ICD-10-CM

## 2015-04-24 ENCOUNTER — Ambulatory Visit
Admission: RE | Admit: 2015-04-24 | Discharge: 2015-04-24 | Disposition: A | Discharge status: Home / Self Care | Payer: 59 | Source: Ambulatory Visit | Attending: General Surgery | Admitting: General Surgery

## 2015-04-24 DIAGNOSIS — R222 Localized swelling, mass and lump, trunk: Secondary | ICD-10-CM

## 2015-10-23 ENCOUNTER — Ambulatory Visit (INDEPENDENT_AMBULATORY_CARE_PROVIDER_SITE_OTHER): Payer: 59 | Admitting: Obstetrics

## 2015-10-23 ENCOUNTER — Encounter: Payer: Self-pay | Admitting: Certified Nurse Midwife

## 2015-10-23 VITALS — BP 134/85 | HR 84 | Ht 63.0 in

## 2015-10-23 DIAGNOSIS — T8332XA Displacement of intrauterine contraceptive device, initial encounter: Secondary | ICD-10-CM

## 2015-10-23 DIAGNOSIS — Z975 Presence of (intrauterine) contraceptive device: Secondary | ICD-10-CM

## 2015-10-23 DIAGNOSIS — Z01419 Encounter for gynecological examination (general) (routine) without abnormal findings: Secondary | ICD-10-CM | POA: Diagnosis not present

## 2015-10-23 DIAGNOSIS — Z538 Procedure and treatment not carried out for other reasons: Secondary | ICD-10-CM

## 2015-10-23 DIAGNOSIS — Z124 Encounter for screening for malignant neoplasm of cervix: Secondary | ICD-10-CM

## 2015-10-23 DIAGNOSIS — N946 Dysmenorrhea, unspecified: Secondary | ICD-10-CM

## 2015-10-23 DIAGNOSIS — Z3009 Encounter for other general counseling and advice on contraception: Secondary | ICD-10-CM

## 2015-10-23 MED ORDER — OXYCODONE-ACETAMINOPHEN 10-325 MG PO TABS: 1.0000 | ORAL_TABLET | ORAL | 0 refills | Status: DC | PRN

## 2015-10-23 NOTE — Progress Notes (Signed)
Patient ID: [name], female   DOB: 1980/06/26, 35 y.o.   MRN: [id number]   Subjective:        [name] is a 35 y.o. female here for a routine exam.  Current complaints: Increased cramping over past month.  Mirena IUD has been in for 5 years and needs to be removed.  Would like tubal sterilization after removal of IUD.  Personal health questionnaire:  Is patient Ashkenazi Jewish, have a family history of breast and/or ovarian cancer: no Is there a family history of uterine cancer diagnosed at age < 67, gastrointestinal cancer, urinary tract cancer, family member who is a Personnel officer syndrome-associated carrier: no Is the patient overweight and hypertensive, family history of diabetes, personal history of gestational diabetes, preeclampsia or PCOS: no Is patient over 5, have PCOS,  family history of premature CHD under age 35, diabetes, smoke, have hypertension or peripheral artery disease:  no At any time, has a partner hit, kicked or otherwise hurt or frightened you?: no Over the past 2 weeks, have you felt down, depressed or hopeless?: no Over the past 2 weeks, have you felt little interest or pleasure in doing things?:no   Gynecologic History No LMP recorded. Patient is not currently having periods (Reason: IUD). Contraception: IUD Last Pap: 2015. Results were: normal Last mammogram: n/a. Results were: n/a  Obstetric History OB History  Gravida Para Term Preterm AB Living  3 3 3  0 0 3  SAB TAB Ectopic Multiple Live Births  0 0 0 0      # Outcome Date GA Lbr Len/2nd Weight Sex Delivery Anes PTL Lv  3 Term           2 Term           1 Term               Past Medical History:  Diagnosis Date  . Renal disorder     Past Surgical History:  Procedure Laterality Date  . CHOLECYSTECTOMY       Current Outpatient Prescriptions:  .  cholecalciferol (VITAMIN D) 1000 units tablet, Take 1,000 Units by mouth daily., Disp: , Rfl:  .  ibuprofen (ADVIL,MOTRIN) 200 MG tablet,  Take 400 mg by mouth every 6 (six) hours as needed for headache or moderate pain (pain). , Disp: , Rfl:  .  S-Adenosylmethionine (SAM-E) 400 MG TABS, Take by mouth., Disp: , Rfl:  .  famotidine (PEPCID) 20 MG tablet, Take 1 tablet (20 mg total) by mouth 2 (two) times daily. (Patient not taking: Reported on 10/23/2015), Disp: 60 tablet, Rfl: 0 .  HYDROcodone-acetaminophen (NORCO/VICODIN) 5-325 MG per tablet, Take 1 tablet by mouth every 6 (six) hours as needed for moderate pain or severe pain., Disp: 10 tablet, Rfl: 0 .  levonorgestrel (MIRENA) 20 MCG/24HR IUD, 1 each by Intrauterine route once. 2013, Disp: , Rfl:  .  ondansetron (ZOFRAN) 4 MG tablet, Take 1 tablet (4 mg total) by mouth every 6 (six) hours. (Patient not taking: Reported on 10/23/2015), Disp: 20 tablet, Rfl: 0 .  ondansetron (ZOFRAN) 4 MG tablet, Take 1 tablet (4 mg total) by mouth every 6 (six) hours. (Patient not taking: Reported on 10/23/2015), Disp: 12 tablet, Rfl: 0 .  oxyCODONE-acetaminophen (PERCOCET) 10-325 MG tablet, Take 1 tablet by mouth every 4 (four) hours as needed for pain., Disp: 40 tablet, Rfl: 0 Allergies  Allergen Reactions  . Morphine And Related Nausea And Vomiting  . Sulfa Antibiotics Rash and Other (See  Comments)    Dizzy and lightheaded     Social History  Substance Use Topics  . Smoking status: Current Every Day Smoker    Packs/day: 0.25  . Smokeless tobacco: Current User  . Alcohol use No    History reviewed. No pertinent family history.    Review of Systems  Constitutional: negative for fatigue and weight loss Respiratory: negative for cough and wheezing Cardiovascular: negative for chest pain, fatigue and palpitations Gastrointestinal: negative for abdominal pain and change in bowel habits Musculoskeletal:negative for myalgias Neurological: negative for gait problems and tremors Behavioral/Psych: negative for abusive relationship, depression Endocrine: negative for temperature intolerance    Genitourinary:positive for heavy periods and pelvic pain Integument/breast: negative for breast lump, breast tenderness, nipple discharge and skin lesion(s)    Objective:       BP 134/85   Pulse 84   Ht 5\' 3"  (1.6 m)  General:   alert  Skin:   no rash or abnormalities  Lungs:   clear to auscultation bilaterally  Heart:   regular rate and rhythm, S1, S2 normal, no murmur, click, rub or gallop  Breasts:   normal without suspicious masses, skin or nipple changes or axillary nodes  Abdomen:  normal findings: no organomegaly, soft, non-tender and no hernia  Pelvis:  External genitalia: normal general appearance Urinary system: urethral meatus normal and bladder without fullness, nontender Vaginal: normal without tenderness, induration or masses Cervix: normal appearance.  IUD string not visible.  Attempted removal of IUD unsuccessful with IUD Hook. Adnexa: normal bimanual exam Uterus: anteverted and non-tender, normal size   Lab Review Urine pregnancy test Labs reviewed yes Radiologic studies reviewed no  50% of 20 min visit spent on counseling and coordination of care.   Assessment:    Healthy female exam.    Attempted IUD removal, unsuccessful  Counseling and advice on contraception.  Wants tubal sterilization.   Plan:    Ultrasound scheduled for IUD position  Education reviewed: calcium supplements, low fat, low cholesterol diet, self breast exams and weight bearing exercise. Contraception: Tubal Sterilization. Follow up in: 2 weeks.  Tubal Consult with scheduling of Outpatient Hysteroscopic Removal of IUD with Tubal Sterilization  Meds ordered this encounter  Medications  . cholecalciferol (VITAMIN D) 1000 units tablet    Sig: Take 1,000 Units by mouth daily.  . S-Adenosylmethionine (SAM-E) 400 MG TABS    Sig: Take by mouth.  . oxyCODONE-acetaminophen (PERCOCET) 10-325 MG tablet    Sig: Take 1 tablet by mouth every 4 (four) hours as needed for pain.    Dispense:   40 tablet    Refill:  0   Orders Placed This Encounter  Procedures  . US Pelvis Complete    Standing Status:   Future    Standing Expiration Date:   12/22/2016    Order Specific Question:   Reason for Exam (SYMPTOM  OR DIAGNOSIS REQUIRED)    Answer:   iud malposition    Order Specific Question:   [facility] imaging [facility]?    [facility]  . US Transvaginal Non-OB    Standing Status:   Future    Standing Expiration Date:   12/22/2016    Order Specific Question:   Reason for Exam (SYMPTOM  OR DIAGNOSIS REQUIRED)    Answer:   iud malposition    Order Specific Question:   Preferred imaging location?    [facility]

## 2015-10-28 ENCOUNTER — Ambulatory Visit (HOSPITAL_COMMUNITY)
Admission: RE | Admit: 2015-10-28 | Discharge: 2015-10-28 | Disposition: A | Discharge status: Home / Self Care | Payer: 59 | Source: Ambulatory Visit | Attending: Obstetrics | Admitting: Obstetrics

## 2015-10-28 DIAGNOSIS — Z975 Presence of (intrauterine) contraceptive device: Secondary | ICD-10-CM | POA: Diagnosis not present

## 2015-10-28 DIAGNOSIS — R102 Pelvic and perineal pain: Secondary | ICD-10-CM | POA: Insufficient documentation

## 2015-10-28 DIAGNOSIS — T8332XA Displacement of intrauterine contraceptive device, initial encounter: Secondary | ICD-10-CM

## 2015-10-28 LAB — PAP IG AND HPV HIGH-RISK
HPV, high-risk: NEGATIVE
PAP Smear Comment: 0

## 2015-10-29 LAB — NUSWAB VG+, CANDIDA 6SP
Candida albicans, NAA: NEGATIVE
Candida glabrata, NAA: NEGATIVE
Candida krusei, NAA: NEGATIVE
Candida lusitaniae, NAA: NEGATIVE
Candida parapsilosis, NAA: NEGATIVE
Candida tropicalis, NAA: NEGATIVE
Chlamydia trachomatis, NAA: NEGATIVE
Neisseria gonorrhoeae, NAA: NEGATIVE
Trich vag by NAA: NEGATIVE

## 2015-11-04 ENCOUNTER — Encounter: Payer: Self-pay | Admitting: Obstetrics and Gynecology

## 2015-11-04 ENCOUNTER — Ambulatory Visit (INDEPENDENT_AMBULATORY_CARE_PROVIDER_SITE_OTHER): Payer: 59 | Admitting: Obstetrics and Gynecology

## 2015-11-04 VITALS — BP 121/86 | HR 77 | Temp 98.6°F | Wt 276.4 lb

## 2015-11-04 DIAGNOSIS — Z308 Encounter for other contraceptive management: Secondary | ICD-10-CM | POA: Diagnosis not present

## 2015-11-04 DIAGNOSIS — Z975 Presence of (intrauterine) contraceptive device: Secondary | ICD-10-CM

## 2015-11-04 NOTE — Progress Notes (Signed)
[name] presents for discussion regarding removal of IUD and sterizlation. She has a Mirena IUD in place which is due for removal. Attempted removal in the office last month was unsuccessful U/S has confirmed proper position of IUD. She also desires sterizlation.   PSH Cholecystomy   PE AF VSS Obese female in NAD Lungs clear Heart RRR Abd soft + BS obese  A/P Desire for sterizlation and removal of IUD  Hysteroscopy removal of IUD reviewed. Bilateral salpingectomy reviewed. R/B and post op care reviewed. Pt verbalized understanding and desires to proceed. Will schedule at her convince.

## 2015-11-05 ENCOUNTER — Encounter (HOSPITAL_COMMUNITY): Payer: Self-pay | Admitting: *Deleted

## 2015-11-06 ENCOUNTER — Encounter (HOSPITAL_COMMUNITY): Payer: Self-pay | Admitting: *Deleted

## 2015-12-11 ENCOUNTER — Other Ambulatory Visit: Payer: Self-pay | Admitting: Obstetrics and Gynecology

## 2015-12-12 NOTE — Patient Instructions (Signed)
Your procedure is scheduled on:  Thursday, Nov. 9, 2017  [facility] at:  7:45 AM  Pick up the phone at the desk and dial [phone].  Call this number if you have problems the morning of surgery: [phone].  Remember: Do NOT eat food or drink after:  Midnight Wednesday, Nov. 8, 2017  Take these medicines the morning of surgery with a SIP OF WATER:  None  Stop taking Ibuprofen at this time  Stop ALL herbal medications at this time   Do NOT wear jewelry (body piercing), metal hair clips/bobby pins, make-up, or nail polish. Do NOT wear lotions, powders, or perfumes.  You may wear deodorant. Do NOT shave for 48 hours prior to surgery. Do NOT bring valuables to the hospital. Contacts, dentures, or bridgework may not be worn into surgery.  Have a responsible adult drive you home and stay with you for 24 hours after your procedure

## 2015-12-15 ENCOUNTER — Encounter (HOSPITAL_COMMUNITY)
Admission: RE | Admit: 2015-12-15 | Discharge: 2015-12-15 | Disposition: A | Discharge status: Home / Self Care | Payer: 59 | Source: Ambulatory Visit | Attending: Obstetrics and Gynecology | Admitting: Obstetrics and Gynecology

## 2015-12-15 ENCOUNTER — Encounter (HOSPITAL_COMMUNITY): Payer: Self-pay

## 2015-12-15 DIAGNOSIS — Z01818 Encounter for other preprocedural examination: Secondary | ICD-10-CM | POA: Diagnosis present

## 2015-12-15 HISTORY — DX: Headache, unspecified: R51.9

## 2015-12-15 HISTORY — DX: Personal history of urinary calculi: Z87.442

## 2015-12-15 HISTORY — DX: Headache: R51

## 2015-12-15 LAB — CBC
HCT: 34.8 % — ABNORMAL LOW (ref 36.0–46.0)
Hemoglobin: 11.5 g/dL — ABNORMAL LOW (ref 12.0–15.0)
MCH: 29.8 pg (ref 26.0–34.0)
MCHC: 33 g/dL (ref 30.0–36.0)
MCV: 90.2 fL (ref 78.0–100.0)
Platelets: 316 10*3/uL (ref 150–400)
RBC: 3.86 MIL/uL — ABNORMAL LOW (ref 3.87–5.11)
RDW: 14.2 % (ref 11.5–15.5)
WBC: 8 10*3/uL (ref 4.0–10.5)

## 2015-12-25 ENCOUNTER — Ambulatory Visit (HOSPITAL_COMMUNITY)
Admission: RE | Admit: 2015-12-25 | Discharge: 2015-12-25 | Disposition: A | Discharge status: Home / Self Care | Payer: 59 | Source: Ambulatory Visit | Attending: Obstetrics and Gynecology | Admitting: Obstetrics and Gynecology

## 2015-12-25 ENCOUNTER — Ambulatory Visit (HOSPITAL_COMMUNITY): Payer: 59 | Admitting: Certified Registered Nurse Anesthetist

## 2015-12-25 ENCOUNTER — Encounter (HOSPITAL_COMMUNITY): Payer: Self-pay

## 2015-12-25 ENCOUNTER — Encounter (HOSPITAL_COMMUNITY)
Admission: RE | Disposition: A | Discharge status: Home / Self Care | Payer: Self-pay | Source: Ambulatory Visit | Attending: Obstetrics and Gynecology

## 2015-12-25 DIAGNOSIS — Z882 Allergy status to sulfonamides status: Secondary | ICD-10-CM | POA: Insufficient documentation

## 2015-12-25 DIAGNOSIS — Z87442 Personal history of urinary calculi: Secondary | ICD-10-CM | POA: Insufficient documentation

## 2015-12-25 DIAGNOSIS — N838 Other noninflammatory disorders of ovary, fallopian tube and broad ligament: Secondary | ICD-10-CM | POA: Insufficient documentation

## 2015-12-25 DIAGNOSIS — Z30432 Encounter for removal of intrauterine contraceptive device: Secondary | ICD-10-CM

## 2015-12-25 DIAGNOSIS — Z6841 Body Mass Index (BMI) 40.0 and over, adult: Secondary | ICD-10-CM | POA: Insufficient documentation

## 2015-12-25 DIAGNOSIS — Z720 Tobacco use: Secondary | ICD-10-CM | POA: Diagnosis not present

## 2015-12-25 DIAGNOSIS — Z885 Allergy status to narcotic agent status: Secondary | ICD-10-CM | POA: Insufficient documentation

## 2015-12-25 DIAGNOSIS — Z302 Encounter for sterilization: Secondary | ICD-10-CM

## 2015-12-25 HISTORY — PX: LAPAROSCOPIC BILATERAL SALPINGECTOMY: SHX5889

## 2015-12-25 HISTORY — PX: IUD REMOVAL: SHX5392

## 2015-12-25 HISTORY — DX: Other specified postprocedural states: Z98.890

## 2015-12-25 HISTORY — DX: Nausea with vomiting, unspecified: R11.2

## 2015-12-25 LAB — PREGNANCY, URINE: Preg Test, Ur: NEGATIVE

## 2015-12-25 SURGERY — SALPINGECTOMY, BILATERAL, LAPAROSCOPIC
Anesthesia: General | Site: Cervix

## 2015-12-25 MED ORDER — IBUPROFEN 800 MG PO TABS: 800.0000 mg | ORAL_TABLET | Freq: Three times a day (TID) | ORAL | 0 refills | Status: DC | PRN

## 2015-12-25 MED ORDER — DEXAMETHASONE SODIUM PHOSPHATE 4 MG/ML IJ SOLN
INTRAMUSCULAR | Status: AC
  Filled 2015-12-25: qty 1

## 2015-12-25 MED ORDER — SUGAMMADEX SODIUM 200 MG/2ML IV SOLN
INTRAVENOUS | Status: DC | PRN
  Administered 2015-12-25: 497.2 mg via INTRAVENOUS

## 2015-12-25 MED ORDER — HYDROMORPHONE HCL 1 MG/ML IJ SOLN
INTRAMUSCULAR | Status: AC
  Administered 2015-12-25: 0.5 mg via INTRAVENOUS
  Filled 2015-12-25: qty 1

## 2015-12-25 MED ORDER — SOD CITRATE-CITRIC ACID 500-334 MG/5ML PO SOLN
30.0000 mL | ORAL | Status: AC
  Administered 2015-12-25: 30 mL via ORAL

## 2015-12-25 MED ORDER — OXYCODONE HCL 5 MG PO TABS
5.0000 mg | ORAL_TABLET | Freq: Once | ORAL | Status: AC | PRN
  Administered 2015-12-25: 5 mg via ORAL

## 2015-12-25 MED ORDER — ONDANSETRON HCL 4 MG/2ML IJ SOLN
INTRAMUSCULAR | Status: DC | PRN
  Administered 2015-12-25: 4 mg via INTRAVENOUS

## 2015-12-25 MED ORDER — KETOROLAC TROMETHAMINE 30 MG/ML IJ SOLN
INTRAMUSCULAR | Status: DC | PRN
  Administered 2015-12-25: 30 mg via INTRAVENOUS

## 2015-12-25 MED ORDER — OXYCODONE HCL 5 MG PO TABS
ORAL_TABLET | ORAL | Status: AC
  Filled 2015-12-25: qty 1

## 2015-12-25 MED ORDER — SOD CITRATE-CITRIC ACID 500-334 MG/5ML PO SOLN
ORAL | Status: AC
  Administered 2015-12-25: 30 mL via ORAL
  Filled 2015-12-25: qty 15

## 2015-12-25 MED ORDER — MIDAZOLAM HCL 2 MG/2ML IJ SOLN
INTRAMUSCULAR | Status: DC | PRN
  Administered 2015-12-25: 2 mg via INTRAVENOUS

## 2015-12-25 MED ORDER — MIDAZOLAM HCL 2 MG/2ML IJ SOLN
INTRAMUSCULAR | Status: AC
  Filled 2015-12-25: qty 2

## 2015-12-25 MED ORDER — LIDOCAINE HCL (CARDIAC) 20 MG/ML IV SOLN
INTRAVENOUS | Status: DC | PRN
  Administered 2015-12-25: 100 mg via INTRAVENOUS

## 2015-12-25 MED ORDER — ONDANSETRON HCL 4 MG/2ML IJ SOLN
INTRAMUSCULAR | Status: AC
  Filled 2015-12-25: qty 2

## 2015-12-25 MED ORDER — BUPIVACAINE HCL (PF) 0.5 % IJ SOLN
INTRAMUSCULAR | Status: AC
  Filled 2015-12-25: qty 30

## 2015-12-25 MED ORDER — PROPOFOL 10 MG/ML IV BOLUS
INTRAVENOUS | Status: DC | PRN
  Administered 2015-12-25: 200 mg via INTRAVENOUS

## 2015-12-25 MED ORDER — KETOROLAC TROMETHAMINE 30 MG/ML IJ SOLN
INTRAMUSCULAR | Status: AC
  Filled 2015-12-25: qty 1

## 2015-12-25 MED ORDER — SUGAMMADEX SODIUM 500 MG/5ML IV SOLN
INTRAVENOUS | Status: AC
  Filled 2015-12-25: qty 5

## 2015-12-25 MED ORDER — PROPOFOL 10 MG/ML IV BOLUS
INTRAVENOUS | Status: AC
  Filled 2015-12-25: qty 20

## 2015-12-25 MED ORDER — ROCURONIUM BROMIDE 100 MG/10ML IV SOLN
INTRAVENOUS | Status: DC | PRN
  Administered 2015-12-25: 20 mg via INTRAVENOUS
  Administered 2015-12-25: 50 mg via INTRAVENOUS

## 2015-12-25 MED ORDER — HYDROMORPHONE HCL 1 MG/ML IJ SOLN
0.2500 mg | INTRAMUSCULAR | Status: DC | PRN
  Administered 2015-12-25: 0.25 mg via INTRAVENOUS
  Administered 2015-12-25 (×2): 0.5 mg via INTRAVENOUS

## 2015-12-25 MED ORDER — LACTATED RINGERS IV SOLN
INTRAVENOUS | Status: DC
  Administered 2015-12-25 (×2): via INTRAVENOUS

## 2015-12-25 MED ORDER — GLYCOPYRROLATE 0.2 MG/ML IJ SOLN
INTRAMUSCULAR | Status: DC | PRN
  Administered 2015-12-25: 0.2 mg via INTRAVENOUS

## 2015-12-25 MED ORDER — OXYCODONE HCL 5 MG/5ML PO SOLN: 5.0000 mg | Freq: Once | ORAL | Status: AC | PRN

## 2015-12-25 MED ORDER — ONDANSETRON HCL 4 MG/2ML IJ SOLN: 4.0000 mg | Freq: Four times a day (QID) | INTRAMUSCULAR | Status: DC | PRN

## 2015-12-25 MED ORDER — HYDROCODONE-ACETAMINOPHEN 5-325 MG PO TABS
1.0000 | ORAL_TABLET | Freq: Four times a day (QID) | ORAL | 0 refills | Status: DC | PRN
Start: 2015-12-25 — End: 2016-01-01

## 2015-12-25 MED ORDER — LIDOCAINE HCL (CARDIAC) 20 MG/ML IV SOLN
INTRAVENOUS | Status: AC
  Filled 2015-12-25: qty 5

## 2015-12-25 MED ORDER — DEXTROSE IN LACTATED RINGERS 5 % IV SOLN: INTRAVENOUS | Status: DC

## 2015-12-25 MED ORDER — SCOPOLAMINE 1 MG/3DAYS TD PT72
MEDICATED_PATCH | TRANSDERMAL | Status: AC
  Administered 2015-12-25: 1.5 mg via TRANSDERMAL
  Filled 2015-12-25: qty 1

## 2015-12-25 MED ORDER — BUPIVACAINE HCL 0.5 % IJ SOLN
INTRAMUSCULAR | Status: DC | PRN
  Administered 2015-12-25: 10 mL

## 2015-12-25 MED ORDER — FENTANYL CITRATE (PF) 100 MCG/2ML IJ SOLN
INTRAMUSCULAR | Status: DC | PRN
  Administered 2015-12-25 (×2): 100 ug via INTRAVENOUS
  Administered 2015-12-25: 50 ug via INTRAVENOUS

## 2015-12-25 MED ORDER — DEXAMETHASONE SODIUM PHOSPHATE 10 MG/ML IJ SOLN
INTRAMUSCULAR | Status: DC | PRN
  Administered 2015-12-25: 4 mg via INTRAVENOUS

## 2015-12-25 MED ORDER — FENTANYL CITRATE (PF) 250 MCG/5ML IJ SOLN
INTRAMUSCULAR | Status: AC
  Filled 2015-12-25: qty 5

## 2015-12-25 MED ORDER — SCOPOLAMINE 1 MG/3DAYS TD PT72
1.0000 | MEDICATED_PATCH | Freq: Once | TRANSDERMAL | Status: DC
  Administered 2015-12-25: 1.5 mg via TRANSDERMAL

## 2015-12-25 MED ORDER — ROCURONIUM BROMIDE 100 MG/10ML IV SOLN
INTRAVENOUS | Status: AC
  Filled 2015-12-25: qty 1

## 2015-12-25 MED ORDER — HYDROMORPHONE HCL 1 MG/ML IJ SOLN
INTRAMUSCULAR | Status: AC
  Filled 2015-12-25: qty 1

## 2015-12-25 SURGICAL SUPPLY — 35 items
APPLICATOR ARISTA FLEXITIP XL (MISCELLANEOUS) IMPLANT
BLADE 15 SAFETY STRL DISP (BLADE) ×3 IMPLANT
CLOTH BEACON ORANGE TIMEOUT ST (SAFETY) ×3 IMPLANT
DRSG OPSITE POSTOP 3X4 (GAUZE/BANDAGES/DRESSINGS) ×3 IMPLANT
DURAPREP 26ML APPLICATOR (WOUND CARE) ×3 IMPLANT
GLOVE BIO SURGEON STRL SZ7.5 (GLOVE) ×6 IMPLANT
GLOVE BIOGEL PI IND STRL 7.0 (GLOVE) ×4 IMPLANT
GLOVE BIOGEL PI INDICATOR 7.0 (GLOVE) ×2
GOWN STRL REUS W/TWL LRG LVL3 (GOWN DISPOSABLE) ×3 IMPLANT
GOWN STRL REUS W/TWL XL LVL3 (GOWN DISPOSABLE) ×3 IMPLANT
HEMOSTAT ARISTA ABSORB 3G PWDR (MISCELLANEOUS) IMPLANT
LIQUID BAND (GAUZE/BANDAGES/DRESSINGS) ×3 IMPLANT
NDL INSUFF ACCESS 14 VERSASTEP (NEEDLE) IMPLANT
PACK LAPAROSCOPY BASIN (CUSTOM PROCEDURE TRAY) ×3 IMPLANT
PACK TRENDGUARD 450 HYBRID PRO (MISCELLANEOUS) IMPLANT
PACK TRENDGUARD 600 HYBRD PROC (MISCELLANEOUS) ×2 IMPLANT
PACK WEDGE PROC TRENDGRD 600 (MISCELLANEOUS) ×2 IMPLANT
POUCH SPECIMEN RETRIEVAL 10MM (ENDOMECHANICALS) ×3 IMPLANT
PROTECTOR NERVE ULNAR (MISCELLANEOUS) ×3 IMPLANT
SCALPEL HARMONIC ACE (MISCELLANEOUS) ×3 IMPLANT
SET IRRIG TUBING LAPAROSCOPIC (IRRIGATION / IRRIGATOR) IMPLANT
SUT MNCRL AB 4-0 PS2 18 (SUTURE) ×3 IMPLANT
SUT VICRYL 0 UR6 27IN ABS (SUTURE) ×3 IMPLANT
TOWEL OR 17X24 6PK STRL BLUE (TOWEL DISPOSABLE) ×6 IMPLANT
TRAY FOLEY CATH SILVER 14FR (SET/KITS/TRAYS/PACK) IMPLANT
TRENDGRD 600 WEDGE PROC PACK (MISCELLANEOUS) ×3
TRENDGUARD 450 HYBRID PRO PACK (MISCELLANEOUS)
TRENDGUARD 600 HYBRID PROC PK (MISCELLANEOUS) ×3
TROCAR BALLN 12MMX100 BLUNT (TROCAR) ×3 IMPLANT
TROCAR VERSASTEP PLUS 12MM (TROCAR) IMPLANT
TROCAR VERSASTEP PLUS 5MM (TROCAR) IMPLANT
TROCAR XCEL NON-BLD 5MMX100MML (ENDOMECHANICALS) ×3 IMPLANT
TROCAR XCEL OPT SLVE 5M 100M (ENDOMECHANICALS) ×3 IMPLANT
WARMER LAPAROSCOPE (MISCELLANEOUS) ×3 IMPLANT
WATER STERILE IRR 1000ML POUR (IV SOLUTION) ×3 IMPLANT

## 2015-12-25 NOTE — Anesthesia Procedure Notes (Signed)
Procedure Name: Intubation Date/Time: 12/25/2015 9:14 AM Performed by: Elgie CongoMALINOVA, Izzy Courville H Pre-anesthesia Checklist: Patient identified, Patient being monitored, Emergency Drugs available and Suction available Patient Re-evaluated:Patient Re-evaluated prior to inductionOxygen Delivery Method: Circle system utilized Preoxygenation: Pre-oxygenation with 100% oxygen Intubation Type: IV induction Ventilation: Mask ventilation without difficulty Laryngoscope Size: Mac and 3 Grade View: Grade I Tube type: Oral Tube size: 7.0 mm Number of attempts: 1 Airway Equipment and Method: Stylet Placement Confirmation: ETT inserted through vocal cords under direct vision,  positive ETCO2 and breath sounds checked- equal and bilateral Secured at: 20 cm Tube secured with: Tape Dental Injury: Teeth and Oropharynx as per pre-operative assessment

## 2015-12-25 NOTE — Discharge Instructions (Signed)
Salpingectomy Salpingectomy, also called tubectomy, is the surgical removal of one of the fallopian tubes. The fallopian tubes are tubes that are connected to the uterus. These tubes transport the egg from the ovary to the uterus. A salpingectomy may be done for various reasons, including:   A tubal (ectopic) pregnancy. This is especially true if the tube ruptures.  An infected fallopian tube.  The need to remove the fallopian tube when removing an ovary with a cyst or tumor.  The need to remove the fallopian tube when removing the uterus.  Cancer of the fallopian tube or nearby organs. Removing one fallopian tube does not prevent you from becoming pregnant. It also does not cause problems with your menstrual periods.  [facility] KNOW ABOUT:  Any allergies you have.  All medicines you are taking, including vitamins, herbs, eye drops, creams, and over-the-counter medicines.  Previous problems you or members of your family have had with the use of anesthetics.  Any blood disorders you have.  Previous surgeries you have had.  Medical conditions you have. RISKS AND COMPLICATIONS  Generally, this is a safe procedure. However, as with any procedure, complications can occur. Possible complications include:  Injury to surrounding organs.  Bleeding.  Infection.  Problems related to anesthesia. BEFORE THE PROCEDURE  Ask your health care provider about changing or stopping your regular medicines. You may need to stop taking certain medicines, such as aspirin or blood thinners, at least 1 week before the surgery.  Do not eat or drink anything for at least 8 hours before the surgery.  If you smoke, do not smoke for at least 2 weeks before the surgery.  Make plans to have someone drive you home after the procedure or after your hospital stay. Also arrange for someone to help you with activities during recovery. PROCEDURE   You will be given medicine to help you  relax before the procedure (sedative). You will then be given medicine to make you sleep through the procedure (general anesthetic). These medicines will be given through an IV access tube that is put into one of your veins.  Once you are asleep, your lower abdomen will be shaved and cleaned. A thin, flexible tube (catheter) will be placed in your bladder.  The surgeon may use a laparoscopic, robotic, or open technique for this surgery:  In the laparoscopic technique, the surgery is done through two small cuts (incisions) in the abdomen. A thin, lighted tube with a tiny camera on the end (laparoscope) is inserted into one of the incisions. The tools needed for the procedure are put through the other incision.  A robotic technique may be chosen to perform complex surgery in a small space. In the robotic technique, small incisions will be made. A camera and surgical instruments are passed through the incisions. Surgical instruments will be controlled with the help of a robotic arm.  In the open technique, the surgery is done through one large incision in the abdomen.  Using any of these techniques, the surgeon removes the fallopian tube from where it attaches to the uterus. The blood vessels will be clamped and tied.  The surgeon then uses staples or stitches to close the incision or incisions. AFTER THE PROCEDURE   You will be taken to a recovery area where your progress will be monitored for 1-3 hours.  If the laparoscopic technique was used, you may be allowed to go home after several hours. You may have some shoulder pain after  the laparoscopic procedure. This is normal and usually goes away in a day or two.  If the open technique was used, you will be admitted to the hospital for a couple of days.  You will be given pain medicine if needed.  The IV access tube and catheter will be removed before you are discharged.   This information is not intended to replace advice given to you by  your health care provider. Make sure you discuss any questions you have with your health care provider.   Document Released: 06/20/2008 Document Revised: 02/22/2014 Document Reviewed: 07/26/2012 Elsevier Interactive Patient Education 2016 [employer]   DISCHARGE INSTRUCTIONS: Laparoscopy  The following instructions have been prepared to help you care for yourself upon your return home today.  Wound care:  Do not get the incision wet for the first 24 hours. The incision should be kept clean and dry.  The Band-Aids or dressings may be removed the day after surgery.  Should the incision become sore, red, and swollen after the first week, check with your doctor.  Personal hygiene:  Shower the day after your procedure.  Activity and limitations:  Do NOT drive or operate any equipment today.  Do NOT lift anything more than 15 pounds for 2-3 weeks after surgery.  Do NOT rest in bed all day.  Walking is encouraged. Walk each day, starting slowly with 5-minute walks 3 or 4 times a day. Slowly increase the length of your walks.  Walk up and down stairs slowly.  Do NOT do strenuous activities, such as golfing, playing tennis, bowling, running, biking, weight lifting, gardening, mowing, or vacuuming for 2-4 weeks. Ask your doctor when it is okay to start.  Diet: Eat a light meal as desired this evening. You may resume your usual diet tomorrow.  Return to work: This is dependent on the type of work you do. For the most part you can return to a desk job within a week of surgery. If you are more active at work, please discuss this with your doctor.  What to expect after your surgery: You may have a slight burning sensation when you urinate on the first day. You may have a very small amount of blood in the urine. Expect to have a small amount of vaginal discharge/light bleeding for 1-2 weeks. It is not unusual to have abdominal soreness and bruising for up to 2 weeks. You may be tired and  need more rest for about 1 week. You may experience shoulder pain for 24-72 hours. Lying flat in bed may relieve it.  Call your doctor for any of the following:  Develop a fever of 100.4 or greater  Inability to urinate 6 hours after discharge from hospital  Severe pain not relieved by pain medications  Persistent of heavy bleeding at incision site  Redness or swelling around incision site after a week  Increasing nausea or vomiting  Patient Signature________________________________________ Nurse Signature_________________________________________     Post Anesthesia Home Care Instructions  Activity: Get plenty of rest for the remainder of the day. A responsible adult should stay with you for 24 hours following the procedure.  For the next 24 hours, DO NOT: -Drive a car -Advertising copywriter -Drink alcoholic beverages -Take any medication unless instructed by your physician -Make any legal decisions or sign important papers.  Meals: Start with liquid foods such as gelatin or soup. Progress to regular foods as tolerated. Avoid greasy, spicy, heavy foods. If nausea and/or vomiting occur, drink only clear liquids  until the nausea and/or vomiting subsides. Call your physician if vomiting continues.  Special Instructions/Symptoms: Your throat may feel dry or sore from the anesthesia or the breathing tube placed in your throat during surgery. If this causes discomfort, gargle with warm salt water. The discomfort should disappear within 24 hours.  If you had a scopolamine patch placed behind your ear for the management of post- operative nausea and/or vomiting:  1. The medication in the patch is effective for 72 hours, after which it should be removed.  Wrap patch in a tissue and discard in the trash. Wash hands thoroughly with soap and water. 2. You may remove the patch earlier than 72 hours if you experience unpleasant side effects which may include dry mouth, dizziness or visual  disturbances. 3. Avoid touching the patch. Wash your hands with soap and water after contact with the patch.

## 2015-12-25 NOTE — Anesthesia Preprocedure Evaluation (Signed)
Anesthesia Evaluation  Patient identified by MRN, date of birth, ID band Patient awake    Reviewed: Allergy & Precautions, H&P , NPO status , Patient's Chart, lab work & pertinent test results  History of Anesthesia Complications (+) PONV and history of anesthetic complications  Airway Mallampati: II   Neck ROM: full    Dental   Pulmonary neg pulmonary ROS, former smoker,    breath sounds clear to auscultation       Cardiovascular negative cardio ROS   Rhythm:regular Rate:Normal     Neuro/Psych  Headaches,    GI/Hepatic   Endo/Other  Morbid obesity  Renal/GU      Musculoskeletal   Abdominal   Peds  Hematology   Anesthesia Other Findings   Reproductive/Obstetrics                             Anesthesia Physical Anesthesia Plan  ASA: II  Anesthesia Plan: General   Post-op Pain Management:    Induction: Intravenous  Airway Management Planned: Oral ETT  Additional Equipment:   Intra-op Plan:   Post-operative Plan: Extubation in OR  Informed Consent: I have reviewed the patients History and Physical, chart, labs and discussed the procedure including the risks, benefits and alternatives for the proposed anesthesia with the patient or authorized representative who has indicated his/her understanding and acceptance.     Plan Discussed with: CRNA, Anesthesiologist and Surgeon  Anesthesia Plan Comments:         Anesthesia Quick Evaluation

## 2015-12-25 NOTE — Op Note (Signed)
Rhetta MuraJennifer R Caamano PROCEDURE DATE: 12/25/2015   PREOPERATIVE DIAGNOSIS:  Undesired fertility  POSTOPERATIVE DIAGNOSIS:  Undesired fertility  PROCEDURE:  Laparoscopic Bilateral Salpingectomy and removal of IUD  SURGEON: Hamzeh Tall L. Tereka Thorley  ASSISTANT:   ANESTHESIA:  General endotracheal  COMPLICATIONS:  None immediate.  ESTIMATED BLOOD LOSS:  10 ml.  FLUIDS: 1200 ml LR.  URINE OUTPUT:  200 ml of clear urine.  IINDICATIONS: 35 y.o. [id number] with undesired fertility, desires permanent sterilization. Other reversible forms of contraception were discussed with patient; she declines all other modalities.  Risks of procedure discussed with patient including permanence of method, risk of regret, bleeding, infection, injury to surrounding organs and need for additional procedures including laparotomy.  Failure risk less than 0.5% with increased risk of ectopic gestation if pregnancy occurs was also discussed with patient.  Written informed consent was obtained.    FINDINGS:  Normal uterus, fallopian tubes, and ovaries.  TECHNIQUE:  The patient was taken to the operating room where general anesthesia was obtained without difficulty.  She was then placed in the dorsal lithotomy position and prepared and draped in sterile fashion.  After an adequate timeout was performed, a bivalved speculum was then placed in the patient's vagina, and the anterior lip of cervix grasped with the single-tooth tenaculum. IUD strings were visualized and grasped with Kelly clamped and removed. The uterine manipulator was then advanced into the uterus.  The speculum was removed from the vagina.  Attention was then turned to the patient's abdomen where a 11-mm skin incision was made in the umbilical fold. S retractors were used to visualize the fascia. Fascia was grasped with Kocher clamps and cut with Mayo scissors. Peritneum was identified and grasped with clamp and cut. The connors of the fascia were secured with 2/0 Vicryl.    A Hoson trocar and sleeve were then placed without difficulty and secured with the 2/0 Vicryl. The abdomen was then insufflated with carbon dioxide gas.  Adequate pneumoperitoneum was obtained.  A survey of the patient's pelvis and abdomen revealed the findings above. Bilateral 5-mm lower quadrant ports  were then placed under direct visualization.  The fallopian tubes were transected from the uterine attachments and the underlying mesosalpinx with the Harmonic device allowing for bilateral salpingectomy.  The fallopian tubes were then removed from the abdomen under direct visualization.  The operative site was surveyed, and it was found to be hemostatic.   No intraoperative injury to other surrounding organs was noted.  The abdomen was desufflated and all instruments were then removed from the patient's abdomen. The fascial incision of the umbilicus was closed with a 2 Vicryl figure of eight stitch. The umbilicus incision was closed with 4/0 Monocryl. The lower quadrant skin incisions were closed with Dermabond.  The uterine manipulator was removed from the cervix without complications. The patient tolerated the procedure well.  Sponge, lap, and needle counts were correct times two.  The patient was then taken to the recovery room awake, extubated and in stable condition.  The patient will be discharged to home as per PACU criteria.  Routine postoperative instructions given.  She was prescribed Percocet, Ibuprofen and Colace.  She will follow up in the clinic on 4 weeks for postoperative evaluation.   Hermina StaggersMichael L Javarion Douty, MD, FACOG Attending Obstetrician & Gynecologist Faculty Practice, [facility]

## 2015-12-25 NOTE — Transfer of Care (Signed)
Immediate Anesthesia Transfer of Care Note  Patient: Tiffany MuraJennifer R Tomich  Procedure(s) Performed: Procedure(s): LAPAROSCOPIC BILATERAL SALPINGECTOMY (Bilateral) INTRAUTERINE DEVICE (IUD) REMOVAL (N/A)  Patient Location: PACU  Anesthesia Type:General  Level of Consciousness: awake, alert  and oriented  Airway & Oxygen Therapy: Patient Spontanous Breathing and Patient connected to nasal cannula oxygen  Post-op Assessment: Report given to RN, Post -op Vital signs reviewed and stable and Patient moving all extremities  Post vital signs: Reviewed and stable  Last Vitals:  Vitals:   12/25/15 0801  BP: (!) 122/94  Pulse: 78  Resp: 18  Temp: 36.6 C    Last Pain:  Vitals:   12/25/15 0801  TempSrc: Oral      Patients Stated Pain Goal: 4 (12/25/15 0801)  Complications: No apparent anesthesia complications

## 2015-12-25 NOTE — H&P (Signed)
Rhetta MuraJennifer R Deschepper is an 35 y.o. female who presents for removal of IUD and bilateral salpingectomy.    Menstrual History: Menarche age: [age] No LMP recorded. Patient is not currently having periods (Reason: IUD).    Past Medical History:  Diagnosis Date  . Headache    Migraines  . History of kidney stones   . PONV (postoperative nausea and vomiting)     Past Surgical History:  Procedure Laterality Date  . CHOLECYSTECTOMY    . LIPOMA EXCISION    . LITHOTRIPSY    . URETEROSCOPY      History reviewed. No pertinent family history.  Social History:  reports that she has quit smoking. She uses smokeless tobacco. She reports that she does not drink alcohol or use drugs.  Allergies:  Allergies  Allergen Reactions  . Morphine And Related Nausea And Vomiting  . Sulfa Antibiotics Rash and Other (See Comments)    Dizzy and lightheaded     Prescriptions Prior to Admission  Medication Sig Dispense Refill Last Dose  . cholecalciferol (VITAMIN D) 1000 units tablet Take 1,000 Units by mouth daily.   Past Month at Unknown time  . ibuprofen (ADVIL,MOTRIN) 200 MG tablet Take 400-600 mg by mouth every 6 (six) hours as needed for headache or moderate pain (pain).    Past Month at Unknown time  . levonorgestrel (MIRENA) 20 MCG/24HR IUD 1 each by Intrauterine route once. 2013   Taking  . S-Adenosylmethionine (SAM-E) 400 MG TABS Take 1 tablet by mouth every other day.    Past Month at Unknown time    Review of Systems  Constitutional: Negative.   Respiratory: Negative.   Cardiovascular: Negative.   Gastrointestinal: Negative.   Genitourinary: Negative.     Blood pressure (!) 122/94, pulse 78, temperature 97.8 F (36.6 C), temperature source Oral, resp. rate 18, SpO2 100 %. Physical Exam  Constitutional: She appears well-developed and well-nourished.  Cardiovascular: Normal rate and regular rhythm.   Respiratory: Effort normal and breath sounds normal.  GI: Soft. Bowel sounds are normal.   Genitourinary:  Genitourinary Comments: Nl EGBUS, cervix without lesions, IUD string noted, uterus small, mobile, no masses    Results for orders placed or performed during the hospital encounter of 12/25/15 (from the past 24 hour(s))  Pregnancy, urine     Status: None   Collection Time: 12/25/15  8:04 AM  Result Value Ref Range   Preg Test, Ur NEGATIVE NEGATIVE    No results found.  Assessment/Plan: Desire for sterilization and removal of IUD  Bilateral salpingectomy has been recommended to pt to achieve her desires plus added benefit of decreasing risks of ovarian cancer. R/B/Post op care have been reviewed with pt. Pt has verbalized understanding and desires to proceed.  Hermina StaggersMichael L Glennis Borger 12/25/2015, 8:58 AM

## 2015-12-25 NOTE — Anesthesia Postprocedure Evaluation (Signed)
Anesthesia Post Note  Patient: [name]  Procedure(s) Performed: Procedure(s) (LRB): LAPAROSCOPIC BILATERAL SALPINGECTOMY (Bilateral) INTRAUTERINE DEVICE (IUD) REMOVAL (N/A)  Patient location during evaluation: PACU Anesthesia Type: General Level of consciousness: awake and alert and patient cooperative Pain management: pain level controlled Vital Signs Assessment: post-procedure vital signs reviewed and stable Respiratory status: spontaneous breathing and respiratory function stable Cardiovascular status: stable Anesthetic complications: no     Last Vitals:  Vitals:   12/25/15 1045 12/25/15 1100  BP: 115/75 121/82  Pulse: 86 83  Resp: 18 15  Temp:      Last Pain:  Vitals:   12/25/15 1040  TempSrc:   PainSc: 6    Pain Goal: Patients Stated Pain Goal: 4 (12/25/15 0801)               Tsugio Elison S

## 2015-12-28 ENCOUNTER — Encounter (HOSPITAL_COMMUNITY): Payer: Self-pay | Admitting: Obstetrics and Gynecology

## 2015-12-31 ENCOUNTER — Other Ambulatory Visit: Payer: Self-pay | Admitting: Obstetrics and Gynecology

## 2016-01-01 ENCOUNTER — Ambulatory Visit (INDEPENDENT_AMBULATORY_CARE_PROVIDER_SITE_OTHER): Payer: 59 | Admitting: Obstetrics & Gynecology

## 2016-01-01 ENCOUNTER — Encounter: Payer: Self-pay | Admitting: Obstetrics & Gynecology

## 2016-01-01 VITALS — BP 115/78 | HR 82 | Temp 98.9°F | Wt 276.0 lb

## 2016-01-01 DIAGNOSIS — Z4889 Encounter for other specified surgical aftercare: Secondary | ICD-10-CM

## 2016-01-01 MED ORDER — HYDROCODONE-ACETAMINOPHEN 5-325 MG PO TABS: 1.0000 | ORAL_TABLET | Freq: Four times a day (QID) | ORAL | 0 refills | Status: DC | PRN

## 2016-01-01 NOTE — Progress Notes (Signed)
Patient states that Tuesday, her incision started hurting and she noticed a greenish discharge. Today pt states that discharge looks more like puss and is still painful.

## 2016-01-01 NOTE — Progress Notes (Signed)
Subjective:   Patient states that Tuesday, her incision started hurting and she noticed a greenish discharge. Today pt states that discharge looks more like pus and is still painful.  [name] is a 35 y.o. female who presents to the clinic1weeks status post laparoscopy and salpingectopmy for requested sterilization. Eating a regular diet without difficulty. Bowel movements are normal. Pain is not well controlled.  Medications being used: narcotic analgesics including hydrocodone.  The following portions of the patient's history were reviewed and updated as appropriate: allergies, current medications, past family history, past medical history, past social history, past surgical history and problem list.  Review of Systems Pertinent items are noted in HPI.    Objective:    BP 115/78   Pulse 82   Temp 98.9 F (37.2 C) (Oral)   Wt 276 lb (125.2 kg)   BMI 46.64 kg/m  General:  alert, cooperative and no distress  Abdomen: soft, bowel sounds active, non-tender  Incision:   healing well, no drainage, no hernia, no seroma, no swelling, no dehiscence, incision well approximated very minimal erythema     Assessment:    Postoperative course complicated by drainage and pain at incsion which appears intact now Operative findings again reviewed. Pathology report discussed.    Plan:    1. Continue any current medications. 2. Wound care discussed. 3. Activity restrictions: no lifting more than 15 pounds and off work tomorrow 4. Anticipated return to work: 2 days. 5. Follow up: 2 weeks   Adam PhenixJames G Equilla Que, MD 01/01/2016

## 2016-01-20 ENCOUNTER — Encounter: Payer: Self-pay | Admitting: Obstetrics and Gynecology

## 2016-01-20 ENCOUNTER — Ambulatory Visit (INDEPENDENT_AMBULATORY_CARE_PROVIDER_SITE_OTHER): Payer: 59 | Admitting: Obstetrics and Gynecology

## 2016-01-20 DIAGNOSIS — Z9889 Other specified postprocedural states: Secondary | ICD-10-CM | POA: Insufficient documentation

## 2016-01-20 NOTE — Progress Notes (Signed)
Pt here for post op visit. She has no complaints today except some PMS. She was seen about 1 week post op for incisional pain and discharge. This has resolved She denies any bowel or bladder dysfunction. No cycle since surgery but feels it is about to come.   PE AF VSS Lungs clear  Heart RRR Abd soft + BS obese incision well healed  A/P Post op visit Return to normal activities as tolerates. Discussed with pt that cycles and PMS may be different first few months after surgery and removal of IUD. Advised to follow these Sx for now. Can treat if needed later.  F/U O/W PRN or with yearly exam.

## 2016-02-23 DIAGNOSIS — L03818 Cellulitis of other sites: Secondary | ICD-10-CM | POA: Diagnosis not present

## 2016-02-24 DIAGNOSIS — B029 Zoster without complications: Secondary | ICD-10-CM | POA: Diagnosis not present

## 2016-02-25 DIAGNOSIS — B0231 Zoster conjunctivitis: Secondary | ICD-10-CM | POA: Diagnosis not present

## 2016-02-25 DIAGNOSIS — B029 Zoster without complications: Secondary | ICD-10-CM | POA: Diagnosis not present

## 2016-03-03 DIAGNOSIS — Z0000 Encounter for general adult medical examination without abnormal findings: Secondary | ICD-10-CM | POA: Diagnosis not present

## 2016-04-16 ENCOUNTER — Ambulatory Visit (HOSPITAL_COMMUNITY)
Admission: EM | Admit: 2016-04-16 | Discharge: 2016-04-16 | Disposition: A | Discharge status: Home / Self Care | Payer: 59 | Attending: Family Medicine | Admitting: Family Medicine

## 2016-04-16 ENCOUNTER — Telehealth: Payer: Self-pay

## 2016-04-16 ENCOUNTER — Encounter (HOSPITAL_COMMUNITY): Payer: Self-pay | Admitting: Emergency Medicine

## 2016-04-16 DIAGNOSIS — N939 Abnormal uterine and vaginal bleeding, unspecified: Secondary | ICD-10-CM

## 2016-04-16 LAB — POCT I-STAT, CHEM 8
BUN: 16 mg/dL (ref 6–20)
Calcium, Ion: 1.21 mmol/L (ref 1.15–1.40)
Chloride: 104 mmol/L (ref 101–111)
Creatinine, Ser: 0.7 mg/dL (ref 0.44–1.00)
Glucose, Bld: 87 mg/dL (ref 65–99)
HCT: 36 % (ref 36.0–46.0)
Hemoglobin: 12.2 g/dL (ref 12.0–15.0)
Potassium: 4.1 mmol/L (ref 3.5–5.1)
Sodium: 140 mmol/L (ref 135–145)
TCO2: 26 mmol/L (ref 0–100)

## 2016-04-16 MED ORDER — NAPROXEN 500 MG PO TABS
500.0000 mg | ORAL_TABLET | Freq: Two times a day (BID) | ORAL | 0 refills | Status: DC
Start: 2016-04-16 — End: 2017-10-07

## 2016-04-16 NOTE — ED Provider Notes (Signed)
CSN: [id number]     Arrival date & time 04/16/16  1031 History   None    Chief Complaint  Patient presents with  . Vaginal Bleeding   (Consider location/radiation/quality/duration/timing/severity/associated sxs/prior Treatment) 36 year old female presents with a one-month history of abnormal vaginal bleeding. She states she had a saplaminectomy in November 2017, and was recovering well without complications. She stated she had a normal period in January, and return February she began to bleed, and she has had continuous spotting since. She further states that the bleeding has worsened, going through up to 10 tampons over the previous 24-48 hours. Patient works as a Designer, jewellery, and is requesting hemoglobin and hematocrit testing. States she would called her physician's office earlier today to request a appointment and they referred her to the emergency room however she presents to urgent care instead.   The history is provided by the patient.  Vaginal Bleeding    Past Medical History:  Diagnosis Date  . Headache    Migraines  . History of kidney stones   . PONV (postoperative nausea and vomiting)    Past Surgical History:  Procedure Laterality Date  . CHOLECYSTECTOMY    . IUD REMOVAL N/A 12/25/2015   Procedure: INTRAUTERINE DEVICE (IUD) REMOVAL;  Surgeon: Hermina Staggers, MD;  Location: [facility];  Service: Gynecology;  Laterality: N/A;  . LAPAROSCOPIC BILATERAL SALPINGECTOMY Bilateral 12/25/2015   Procedure: LAPAROSCOPIC BILATERAL SALPINGECTOMY;  Surgeon: Hermina Staggers, MD;  Location: [facility];  Service: Gynecology;  Laterality: Bilateral;  . LIPOMA EXCISION    . LITHOTRIPSY    . URETEROSCOPY     History reviewed. No pertinent family history. Social History  Substance Use Topics  . Smoking status: Former Games developer  . Smokeless tobacco: Current User  . Alcohol use No   OB History    Gravida Para Term Preterm AB Living   3 3 3  0 0 3   SAB TAB Ectopic Multiple Live Births   0 0  0 0       Review of Systems  Genitourinary: Positive for vaginal bleeding.    Allergies  Morphine and related and Sulfa antibiotics  Home Medications   Prior to Admission medications   Medication Sig Start Date End Date Taking? Authorizing Provider  cholecalciferol (VITAMIN D) 1000 units tablet Take 1,000 Units by mouth daily.    Historical Provider, MD  ibuprofen (ADVIL,MOTRIN) 800 MG tablet Take 1 tablet (800 mg total) by mouth every 8 (eight) hours as needed. 12/25/15   Hermina Staggers, MD  naproxen (NAPROSYN) 500 MG tablet Take 1 tablet (500 mg total) by mouth 2 (two) times daily. 04/16/16   Dorena Bodo, NP  S-Adenosylmethionine (SAM-E) 400 MG TABS Take 1 tablet by mouth every other day.     Historical Provider, MD   Meds Ordered and Administered this Visit  Medications - No data to display  BP 150/91 (BP Location: Right Wrist)   Pulse 82   Temp 98.6 F (37 C) (Oral)   Resp 18   SpO2 100%  No data found.   Physical Exam  Constitutional: She is oriented to person, place, and time. She appears well-developed and well-nourished. No distress.  HENT:  Head: Normocephalic and atraumatic.  Right Ear: External ear normal.  Left Ear: External ear normal.  Neck: Normal range of motion. Neck supple. No JVD present.  Cardiovascular: Normal rate and regular rhythm.   Pulmonary/Chest: Effort normal and breath sounds normal.  Abdominal: Soft. Bowel sounds are normal.  There is no hepatosplenomegaly. There is no tenderness. There is no rigidity, no rebound, no guarding and no CVA tenderness.  Genitourinary:  Genitourinary Comments: Deferred at patient request  Neurological: She is alert and oriented to person, place, and time.  Skin: Skin is warm and dry. Capillary refill takes less than 2 seconds. She is not diaphoretic.  Psychiatric: She has a normal mood and affect.  Nursing note and vitals reviewed.   Urgent Care Course     Procedures (including critical care  time)  Labs Review Labs Reviewed  POCT I-STAT, CHEM 8    Imaging Review No results found.     MDM   1. Vaginal bleeding    Your H&H was 36, and 12.2 respectively. I prescribed Naprosyn 500 mg, take one tablet by mouth twice a day. I recommend you schedule an appointment with her gynecologist as soon as possible for further evaluation and management of your symptoms. Should you become dizzy, weak, experienced chest pain, or shortness of breath, I recommend following up either at the ER, or [facility].     Dorena BodoLawrence Dj Senteno, NP 04/16/16 1154

## 2016-04-16 NOTE — Discharge Instructions (Signed)
Your H&H was 36, and 12.2 respectively. I prescribed Naprosyn 500 mg, take one tablet by mouth twice a day. I recommend you schedule an appointment with her gynecologist as soon as possible for further evaluation and management of your symptoms. Should you become dizzy, weak, experienced chest pain, or shortness of breath, I recommend following up either at the ER, or at [facility].

## 2016-04-16 NOTE — Telephone Encounter (Signed)
Patient called in stating that she had surgery in Nov, but lately she has been having excessive bleeding that is becoming hard to manage. Patient states that she has been going thru a tampon/pad every 45 minutes and in pain. She also states that she doe snot feel well. Advised patient to go to hospital to be evaluated.

## 2016-04-16 NOTE — ED Triage Notes (Signed)
C/o intermittent vag bleeding onset 2 months ... Last 2-3 days, bleeding and pain have become worse  Going through 10 tampons/day   Sx today include: weakness and fatigue... Would like hemoglobin checked.   Had IUD removed in November 21017  A&O x4... NAD

## 2016-05-16 ENCOUNTER — Encounter (HOSPITAL_COMMUNITY): Payer: Self-pay

## 2016-05-16 ENCOUNTER — Inpatient Hospital Stay (HOSPITAL_COMMUNITY)
Admission: AD | Admit: 2016-05-16 | Discharge: 2016-05-16 | Disposition: A | Discharge status: Home / Self Care | Payer: 59 | Source: Ambulatory Visit | Attending: Obstetrics and Gynecology | Admitting: Obstetrics and Gynecology

## 2016-05-16 DIAGNOSIS — Z90722 Acquired absence of ovaries, bilateral: Secondary | ICD-10-CM | POA: Insufficient documentation

## 2016-05-16 DIAGNOSIS — Z885 Allergy status to narcotic agent status: Secondary | ICD-10-CM | POA: Insufficient documentation

## 2016-05-16 DIAGNOSIS — G43909 Migraine, unspecified, not intractable, without status migrainosus: Secondary | ICD-10-CM | POA: Diagnosis not present

## 2016-05-16 DIAGNOSIS — Z9049 Acquired absence of other specified parts of digestive tract: Secondary | ICD-10-CM | POA: Insufficient documentation

## 2016-05-16 DIAGNOSIS — Z3202 Encounter for pregnancy test, result negative: Secondary | ICD-10-CM | POA: Diagnosis not present

## 2016-05-16 DIAGNOSIS — Z882 Allergy status to sulfonamides status: Secondary | ICD-10-CM | POA: Diagnosis not present

## 2016-05-16 DIAGNOSIS — Z87891 Personal history of nicotine dependence: Secondary | ICD-10-CM | POA: Diagnosis not present

## 2016-05-16 DIAGNOSIS — Z9889 Other specified postprocedural states: Secondary | ICD-10-CM | POA: Insufficient documentation

## 2016-05-16 DIAGNOSIS — N898 Other specified noninflammatory disorders of vagina: Secondary | ICD-10-CM | POA: Diagnosis not present

## 2016-05-16 DIAGNOSIS — Z87442 Personal history of urinary calculi: Secondary | ICD-10-CM | POA: Diagnosis not present

## 2016-05-16 LAB — URINALYSIS, ROUTINE W REFLEX MICROSCOPIC
Bacteria, UA: NONE SEEN
Bilirubin Urine: NEGATIVE
Glucose, UA: NEGATIVE mg/dL
Ketones, ur: NEGATIVE mg/dL
Leukocytes, UA: NEGATIVE
Nitrite: NEGATIVE
Protein, ur: NEGATIVE mg/dL
Specific Gravity, Urine: 1.018 (ref 1.005–1.030)
pH: 5 (ref 5.0–8.0)

## 2016-05-16 LAB — POCT PREGNANCY, URINE: Preg Test, Ur: NEGATIVE

## 2016-05-16 MED ORDER — OXYCODONE-ACETAMINOPHEN 5-325 MG PO TABS: 2.0000 | ORAL_TABLET | Freq: Four times a day (QID) | ORAL | 0 refills | Status: DC | PRN

## 2016-05-16 NOTE — Discharge Instructions (Signed)
Skin Abscess A skin abscess is an infected area on or under your skin that contains a collection of pus and other material. An abscess may also be called a furuncle, carbuncle, or boil. An abscess can occur in or on almost any part of your body. Some abscesses break open (rupture) on their own. Most continue to get worse unless they are treated. The infection can spread deeper into the body and eventually into your blood, which can make you feel ill. Treatment usually involves draining the abscess. What are the causes? An abscess occurs when germs, often bacteria, pass through your skin and cause an infection. This may be caused by:  A scrape or cut on your skin.  A puncture wound through your skin, including a needle injection.  Blocked oil or sweat glands.  Blocked and infected hair follicles.  A cyst that forms beneath your skin (sebaceous cyst) and becomes infected. What increases the risk? This condition is more likely to develop in people who:  Have a weak body defense system (immune system).  Have diabetes.  Have dry and irritated skin.  Get frequent injections or use illegal IV drugs.  Have a foreign body in a wound, such as a splinter.  Have problems with their lymph system or veins. What are the signs or symptoms? An abscess may start as a painful, firm bump under the skin. Over time, the abscess may get larger or become softer. Pus may appear at the top of the abscess, causing pressure and pain. It may eventually break through the skin and drain. Other symptoms include:  Redness.  Warmth.  Swelling.  Tenderness.  A sore on the skin. How is this diagnosed? This condition is diagnosed based on your medical history and a physical exam. A sample of pus may be taken from the abscess to find out what is causing the infection and what antibiotics can be used to treat it. You also may have:  Blood tests to look for signs of infection or spread of an infection to your  blood.  Imaging studies such as ultrasound, CT scan, or MRI if the abscess is deep. How is this treated? Small abscesses that drain on their own may not need treatment. Treatment for an abscess that does not rupture on its own may include:  Warm compresses applied to the area several times per day.  Incision and drainage. Your health care provider will make an incision to open the abscess and will remove pus and any foreign body or dead tissue. The incision area may be packed with gauze to keep it open for a few days while it heals.  Antibiotic medicines to treat infection. For a severe abscess, you may first get antibiotics through an IV and then change to oral antibiotics. Follow these instructions at home: Abscess Care   If you have an abscess that has not drained, place a warm, clean, wet washcloth over the abscess several times a day. Do this as told by your health care provider.  Follow instructions from your health care provider about how to take care of your abscess. Make sure you:  Cover the abscess with a bandage (dressing).  Change your dressing or gauze as told by your health care provider.  Wash your hands with soap and water before you change the dressing or gauze. If soap and water are not available, use hand sanitizer.  Check your abscess every day for signs of a worsening infection. Check for:  More redness, swelling, or  pain.  More fluid or blood.  Warmth.  More pus or a bad smell. Medicines   Take over-the-counter and prescription medicines only as told by your health care provider. General instructions   To avoid spreading the infection:  Do not share personal care items, towels, or hot tubs with others.  Avoid making skin contact with other people.  Keep all follow-up visits as told by your health care provider. This is important. Contact a health care provider if:  You have more redness, swelling, or pain around your abscess.  You have more fluid  or blood coming from your abscess.  Your abscess feels warm to the touch.  You have more pus or a bad smell coming from your abscess.  You have a fever.  You have muscle aches.  You have chills or a general ill feeling. Get help right away if:  You have severe pain.  You see red streaks on your skin spreading away from the abscess. This information is not intended to replace advice given to you by your health care provider. Make sure you discuss any questions you have with your health care provider. Document Released: 11/11/2004 Document Revised: 09/28/2015 Document Reviewed: 12/11/2014 Elsevier Interactive Patient Education  2017 [employer].

## 2016-05-16 NOTE — [facility] Provider Note (Signed)
History     CSN: [id number]  Arrival date and time: 05/16/16 0847   First Provider Initiated Contact with Patient 05/16/16 [id number]      Chief Complaint  Patient presents with  . Recurrent Skin Infections   HPI [name] is a 36 y.o. [id number] female who presents for skin abscess. First noticed small bump on left labia last month; states wasn't painful at that time. Increased in size & pain over the last 3 days. Has been using warm compresses, sitz baths, ibuprofen, & tylenol without relief. Has not noticed drainage & denies fever. Currently on menses. States she frequently gets boils in her groin but has not had one on her labia before. Rates pain 4/10. Worse with sitting & walking.    Past Medical History:  Diagnosis Date  . Headache    Migraines  . History of kidney stones   . PONV (postoperative nausea and vomiting)     Past Surgical History:  Procedure Laterality Date  . CHOLECYSTECTOMY    . IUD REMOVAL N/A 12/25/2015   Procedure: INTRAUTERINE DEVICE (IUD) REMOVAL;  Surgeon: Hermina Staggers, MD;  Location: [facility];  Service: Gynecology;  Laterality: N/A;  . LAPAROSCOPIC BILATERAL SALPINGECTOMY Bilateral 12/25/2015   Procedure: LAPAROSCOPIC BILATERAL SALPINGECTOMY;  Surgeon: Hermina Staggers, MD;  Location: [facility];  Service: Gynecology;  Laterality: Bilateral;  . LIPOMA EXCISION    . LITHOTRIPSY    . URETEROSCOPY      History reviewed. No pertinent family history.  Social History  Substance Use Topics  . Smoking status: Former Games developer  . Smokeless tobacco: Current User  . Alcohol use No    Allergies:  Allergies  Allergen Reactions  . Morphine And Related Nausea And Vomiting  . Sulfa Antibiotics Rash and Other (See Comments)    Dizzy and lightheaded     Prescriptions Prior to Admission  Medication Sig Dispense Refill Last Dose  . cholecalciferol (VITAMIN D) 1000 units tablet Take 1,000 Units by mouth daily.   Unknown at Unknown time  . ibuprofen (ADVIL,MOTRIN) 800  MG tablet Take 1 tablet (800 mg total) by mouth every 8 (eight) hours as needed. 30 tablet 0 Unknown at Unknown time  . naproxen (NAPROSYN) 500 MG tablet Take 1 tablet (500 mg total) by mouth 2 (two) times daily. 30 tablet 0   . S-Adenosylmethionine (SAM-E) 400 MG TABS Take 1 tablet by mouth every other day.    Unknown at Unknown time    Review of Systems  Constitutional: Negative for chills and fever.  Gastrointestinal: Negative.   Genitourinary: Positive for vaginal bleeding (appropriate for menses). Negative for vaginal discharge.  Skin:       + vaginal lump   Physical Exam   Blood pressure (!) 132/56, pulse 86, temperature 97.5 F (36.4 C), temperature source Oral, resp. rate 16, height  (1.626 m), weight 281 lb (127.5 kg), last menstrual period 05/13/2016.  Physical Exam  Nursing note and vitals reviewed. Constitutional: She is oriented to person, place, and time. She appears well-developed and well-nourished. No distress.  HENT:  Head: Normocephalic and atraumatic.  Eyes: Conjunctivae are normal. Right eye exhibits no discharge. Left eye exhibits no discharge. No scleral icterus.  Neck: Normal range of motion.  Respiratory: Effort normal. No respiratory distress.  Genitourinary:     Neurological: She is alert and oriented to person, place, and time.  Skin: Skin is warm and dry. She is not diaphoretic.  Psychiatric: She has a normal mood and  affect. Her behavior is normal. Judgment and thought content normal.    MAU Course  Procedures Results for orders placed or performed during the hospital encounter of 05/16/16 (from the past 24 hour(s))  Pregnancy, urine POC     Status: None   Collection Time: 05/16/16  8:59 AM  Result Value Ref Range   Preg Test, Ur NEGATIVE NEGATIVE    MDM UPT negative Small abscess of upper left labia that is spontaneously drainage. No indication for I&D or antibiotics at this time. Patient to f/u with [facility] or [facility] for worsening  symptoms or fever  Assessment and Plan  A: 1. Vaginal lesion    P: Discharge home Continue sitz baths & warm compresses Discussed reasons to return to [facility] or [facility]  [name] 05/16/2016, 9:16 AM

## 2016-05-16 NOTE — MAU Note (Signed)
Patient presents with ? Boil in vaginal area, first noticed about a month ago was small, now very large, has tried soaks, warm compresses, nothing has helped.

## 2016-07-05 DIAGNOSIS — H6980 Other specified disorders of Eustachian tube, unspecified ear: Secondary | ICD-10-CM | POA: Diagnosis not present

## 2016-07-09 DIAGNOSIS — H8149 Vertigo of central origin, unspecified ear: Secondary | ICD-10-CM | POA: Diagnosis not present

## 2016-07-09 DIAGNOSIS — H8112 Benign paroxysmal vertigo, left ear: Secondary | ICD-10-CM | POA: Diagnosis not present

## 2016-07-20 DIAGNOSIS — R5383 Other fatigue: Secondary | ICD-10-CM | POA: Diagnosis not present

## 2016-08-09 ENCOUNTER — Ambulatory Visit
Admission: RE | Admit: 2016-08-09 | Discharge: 2016-08-09 | Disposition: A | Discharge status: Home / Self Care | Payer: 59 | Source: Ambulatory Visit | Attending: Family Medicine | Admitting: Family Medicine

## 2016-08-09 ENCOUNTER — Other Ambulatory Visit: Payer: Self-pay | Admitting: Family Medicine

## 2016-08-09 DIAGNOSIS — M25521 Pain in right elbow: Secondary | ICD-10-CM | POA: Diagnosis not present

## 2016-08-09 DIAGNOSIS — S59901A Unspecified injury of right elbow, initial encounter: Secondary | ICD-10-CM | POA: Diagnosis not present

## 2016-08-09 DIAGNOSIS — Z713 Dietary counseling and surveillance: Secondary | ICD-10-CM | POA: Diagnosis not present

## 2016-09-27 DIAGNOSIS — K5909 Other constipation: Secondary | ICD-10-CM | POA: Diagnosis not present

## 2016-09-27 DIAGNOSIS — R1031 Right lower quadrant pain: Secondary | ICD-10-CM | POA: Diagnosis not present

## 2016-11-22 DIAGNOSIS — L700 Acne vulgaris: Secondary | ICD-10-CM | POA: Diagnosis not present

## 2016-11-26 DIAGNOSIS — L700 Acne vulgaris: Secondary | ICD-10-CM | POA: Diagnosis not present

## 2016-12-27 DIAGNOSIS — L700 Acne vulgaris: Secondary | ICD-10-CM | POA: Diagnosis not present

## 2017-01-31 DIAGNOSIS — L700 Acne vulgaris: Secondary | ICD-10-CM | POA: Diagnosis not present

## 2017-03-03 DIAGNOSIS — L700 Acne vulgaris: Secondary | ICD-10-CM | POA: Diagnosis not present

## 2017-03-03 DIAGNOSIS — Z3202 Encounter for pregnancy test, result negative: Secondary | ICD-10-CM | POA: Diagnosis not present

## 2017-03-16 DIAGNOSIS — Z5181 Encounter for therapeutic drug level monitoring: Secondary | ICD-10-CM | POA: Diagnosis not present

## 2017-03-16 DIAGNOSIS — L700 Acne vulgaris: Secondary | ICD-10-CM | POA: Diagnosis not present

## 2017-04-05 DIAGNOSIS — Z3202 Encounter for pregnancy test, result negative: Secondary | ICD-10-CM | POA: Diagnosis not present

## 2017-04-05 DIAGNOSIS — L700 Acne vulgaris: Secondary | ICD-10-CM | POA: Diagnosis not present

## 2017-05-03 DIAGNOSIS — L700 Acne vulgaris: Secondary | ICD-10-CM | POA: Diagnosis not present

## 2017-05-03 DIAGNOSIS — Z3202 Encounter for pregnancy test, result negative: Secondary | ICD-10-CM | POA: Diagnosis not present

## 2017-05-11 DIAGNOSIS — Z6841 Body Mass Index (BMI) 40.0 and over, adult: Secondary | ICD-10-CM | POA: Diagnosis not present

## 2017-05-11 DIAGNOSIS — R5383 Other fatigue: Secondary | ICD-10-CM | POA: Diagnosis not present

## 2017-05-11 DIAGNOSIS — R29818 Other symptoms and signs involving the nervous system: Secondary | ICD-10-CM | POA: Diagnosis not present

## 2017-05-11 DIAGNOSIS — M2560 Stiffness of unspecified joint, not elsewhere classified: Secondary | ICD-10-CM | POA: Diagnosis not present

## 2017-06-07 DIAGNOSIS — Z3202 Encounter for pregnancy test, result negative: Secondary | ICD-10-CM | POA: Diagnosis not present

## 2017-06-07 DIAGNOSIS — L700 Acne vulgaris: Secondary | ICD-10-CM | POA: Diagnosis not present

## 2017-06-13 DIAGNOSIS — R0681 Apnea, not elsewhere classified: Secondary | ICD-10-CM | POA: Diagnosis not present

## 2017-06-13 DIAGNOSIS — R0683 Snoring: Secondary | ICD-10-CM | POA: Diagnosis not present

## 2017-07-12 DIAGNOSIS — L700 Acne vulgaris: Secondary | ICD-10-CM | POA: Diagnosis not present

## 2017-07-12 DIAGNOSIS — Z3202 Encounter for pregnancy test, result negative: Secondary | ICD-10-CM | POA: Diagnosis not present

## 2017-07-20 DIAGNOSIS — G4733 Obstructive sleep apnea (adult) (pediatric): Secondary | ICD-10-CM | POA: Diagnosis not present

## 2017-07-21 DIAGNOSIS — G4733 Obstructive sleep apnea (adult) (pediatric): Secondary | ICD-10-CM | POA: Diagnosis not present

## 2017-08-11 DIAGNOSIS — Z3202 Encounter for pregnancy test, result negative: Secondary | ICD-10-CM | POA: Diagnosis not present

## 2017-08-11 DIAGNOSIS — L700 Acne vulgaris: Secondary | ICD-10-CM | POA: Diagnosis not present

## 2017-09-01 DIAGNOSIS — G4733 Obstructive sleep apnea (adult) (pediatric): Secondary | ICD-10-CM | POA: Diagnosis not present

## 2017-09-12 DIAGNOSIS — Z3202 Encounter for pregnancy test, result negative: Secondary | ICD-10-CM | POA: Diagnosis not present

## 2017-09-12 DIAGNOSIS — L700 Acne vulgaris: Secondary | ICD-10-CM | POA: Diagnosis not present

## 2017-10-02 DIAGNOSIS — G4733 Obstructive sleep apnea (adult) (pediatric): Secondary | ICD-10-CM | POA: Diagnosis not present

## 2017-10-02 DIAGNOSIS — J029 Acute pharyngitis, unspecified: Secondary | ICD-10-CM | POA: Diagnosis not present

## 2017-10-05 DIAGNOSIS — G4733 Obstructive sleep apnea (adult) (pediatric): Secondary | ICD-10-CM | POA: Diagnosis not present

## 2017-10-07 ENCOUNTER — Ambulatory Visit (HOSPITAL_COMMUNITY)
Admission: EM | Admit: 2017-10-07 | Discharge: 2017-10-07 | Disposition: A | Discharge status: Home / Self Care | Payer: 59 | Attending: Family Medicine | Admitting: Family Medicine

## 2017-10-07 ENCOUNTER — Encounter (HOSPITAL_COMMUNITY): Payer: Self-pay

## 2017-10-07 DIAGNOSIS — J039 Acute tonsillitis, unspecified: Secondary | ICD-10-CM | POA: Diagnosis not present

## 2017-10-07 DIAGNOSIS — Z87891 Personal history of nicotine dependence: Secondary | ICD-10-CM | POA: Diagnosis not present

## 2017-10-07 NOTE — ED Provider Notes (Signed)
[facility]    CSN: [id number] Arrival date & time: 10/07/17  0809     History   Chief Complaint Chief Complaint  Patient presents with  . Sore Throat    HPI [name] is a 37 y.o. female.   HPI Patient is here for evaluation of a sore throat.  She is had a sore throat for 1 week.  She called a tele-med service available through her insurance and was evaluated by telephone.  She was given a prescription for Keflex.  She is been taking this for 5 days.  No testing was done.  In spite of this she still has a sore throat.  It is more painful on the right than the left side.  She has pain with swallowing.  She feels like her sore throat is getting worse.  No fever or chills.  She has ear pressure and pain on that right side.  No exposure to strep.  No prior problems with tonsils.  Past Medical History:  Diagnosis Date  . Headache    Migraines  . History of kidney stones   . PONV (postoperative nausea and vomiting)     Patient Active Problem List   Diagnosis Date Noted  . Post-operative state 01/20/2016    Past Surgical History:  Procedure Laterality Date  . CHOLECYSTECTOMY    . IUD REMOVAL N/A 12/25/2015   Procedure: INTRAUTERINE DEVICE (IUD) REMOVAL;  Surgeon: Hermina Staggers, MD;  Location: [facility];  Service: Gynecology;  Laterality: N/A;  . LAPAROSCOPIC BILATERAL SALPINGECTOMY Bilateral 12/25/2015   Procedure: LAPAROSCOPIC BILATERAL SALPINGECTOMY;  Surgeon: Hermina Staggers, MD;  Location: [facility];  Service: Gynecology;  Laterality: Bilateral;  . LIPOMA EXCISION    . LITHOTRIPSY    . URETEROSCOPY      OB History    Gravida  3   Para  3   Term  3   Preterm  0   AB  0   Living  3     SAB  0   TAB  0   Ectopic  0   Multiple  0   Live Births  3            Home Medications    Prior to Admission medications   Medication Sig Start Date End Date Taking? Authorizing Provider  cholecalciferol (VITAMIN D) 1000 units tablet Take 1,000  Units by mouth daily.    [provider]  S-Adenosylmethionine (SAM-E) 400 MG TABS Take 1 tablet by mouth every other day.     [provider]    Family History History reviewed. No pertinent family history.  Social History Social History   Tobacco Use  . Smoking status: Former Games developer  . Smokeless tobacco: Current User  Substance Use Topics  . Alcohol use: No  . Drug use: No     Allergies   Morphine and related and Sulfa antibiotics   Review of Systems Review of Systems  Constitutional: Positive for fatigue. Negative for chills and fever.  HENT: Positive for ear pain and sore throat.   Eyes: Negative for pain and visual disturbance.  Respiratory: Negative for cough and shortness of breath.   Cardiovascular: Negative for chest pain and palpitations.  Gastrointestinal: Negative for abdominal pain and vomiting.  Genitourinary: Negative for dysuria and hematuria.  Musculoskeletal: Negative for arthralgias and back pain.  Skin: Negative for color change and rash.  Neurological: Negative for seizures and syncope.  All other systems reviewed and are  negative.    Physical Exam Triage Vital Signs ED Triage Vitals [10/07/17 0831]  Enc Vitals Group     BP 118/80     Pulse Rate 80     Resp 20     Temp 98.2 F (36.8 C)     Temp Source Oral     SpO2 96 %     Weight      Height      Head Circumference      Peak Flow      Pain Score 5     Pain Loc      Pain Edu?      Excl. in GC?    No data found.  Updated Vital Signs BP 118/80 (BP Location: Right Arm)   Pulse 80   Temp 98.2 F (36.8 C) (Oral)   Resp 20   LMP 10/04/2017 (LMP Unknown)   SpO2 96%   Visual Acuity Right Eye Distance:   Left Eye Distance:   Bilateral Distance:    Right Eye Near:   Left Eye Near:    Bilateral Near:     Physical Exam  Constitutional: She appears well-developed and well-nourished. No distress.  HENT:  Head: Normocephalic and atraumatic.  Right Ear: Hearing  and tympanic membrane normal.  Left Ear: Hearing and tympanic membrane normal.  Mouth/Throat: Uvula is midline and oropharynx is clear and moist. Tonsils are 4+ on the right. Tonsils are 2+ on the left. No tonsillar exudate.  Right tonsil is swollen, touches the midline uvula.  Mild erythema.  No exudate.  The left tonsil is of normal size.  Eyes: Pupils are equal, round, and reactive to light. Conjunctivae are normal.  Neck: Normal range of motion.  Cardiovascular: Normal rate.  Pulmonary/Chest: Effort normal. No respiratory distress.  Abdominal: Soft. She exhibits no distension.  Musculoskeletal: Normal range of motion. She exhibits no edema.  Neurological: She is alert.  Skin: Skin is warm and dry.     UC Treatments / Results  Labs (all labs ordered are listed, but only abnormal results are displayed) Labs Reviewed - No data to display  EKG None  Radiology No results found.  Procedures Procedures (including critical care time)  Medications Ordered in UC Medications - No data to display  Initial Impression / Assessment and Plan / UC Course  I have reviewed the triage vital signs and the nursing notes.  Pertinent labs & imaging results that were available during my care of the patient were reviewed by me and considered in my medical decision making (see chart for details).     Discussed possibility of a tonsillar abscess.  I am concerned that she has unilateral tonsillitis, on Keflex.  I called Dr. Annalee GentaShoemaker and ENT in his office agreed to see her today.  Patient was happy with this outcome. Final Clinical Impressions(s) / UC Diagnoses   Final diagnoses:  Tonsillitis     Discharge Instructions     [facility] Be there by 2:15 for a 2:30 appt Office [phone] [address], second floor   ED Prescriptions    None     Controlled Substance Prescriptions [place] Controlled Substance Registry  consulted? Not Applicable   Eustace MooreNelson, Jaylyn Booher Sue, MD 10/07/17 2111

## 2017-10-07 NOTE — ED Triage Notes (Signed)
Pt presents with sore throat, swelling and inflammation; was started on an antibiotic on Sunday but symptoms seem to be getting worse.

## 2017-10-07 NOTE — Discharge Instructions (Addendum)
You have an appointment today [facility] Be there by 2:15 for a 2:30 appt Office [phone] [address], second floor

## 2017-10-19 DIAGNOSIS — G4733 Obstructive sleep apnea (adult) (pediatric): Secondary | ICD-10-CM | POA: Diagnosis not present

## 2017-11-02 DIAGNOSIS — G4733 Obstructive sleep apnea (adult) (pediatric): Secondary | ICD-10-CM | POA: Diagnosis not present

## 2017-11-07 DIAGNOSIS — G4733 Obstructive sleep apnea (adult) (pediatric): Secondary | ICD-10-CM | POA: Diagnosis not present

## 2017-12-02 DIAGNOSIS — G4733 Obstructive sleep apnea (adult) (pediatric): Secondary | ICD-10-CM | POA: Diagnosis not present

## 2019-08-13 ENCOUNTER — Telehealth: Payer: Self-pay | Admitting: Family Medicine

## 2019-08-13 NOTE — Telephone Encounter (Signed)
Patient called to get a appointment, seen she was last seen at [facility], patient have [employer] insurance Told her she should call them to get scheduled.

## 2019-09-06 ENCOUNTER — Ambulatory Visit: Payer: 59 | Admitting: Obstetrics and Gynecology

## 2019-10-10 ENCOUNTER — Ambulatory Visit: Payer: 59 | Admitting: Obstetrics and Gynecology

## 2020-03-26 ENCOUNTER — Ambulatory Visit (INDEPENDENT_AMBULATORY_CARE_PROVIDER_SITE_OTHER): Payer: 59 | Admitting: Bariatrics

## 2020-03-26 ENCOUNTER — Encounter (INDEPENDENT_AMBULATORY_CARE_PROVIDER_SITE_OTHER): Payer: Self-pay | Admitting: Bariatrics

## 2020-03-26 ENCOUNTER — Other Ambulatory Visit: Payer: Self-pay

## 2020-03-26 VITALS — BP 128/84 | HR 73 | Temp 98.0°F | Ht 64.0 in | Wt 300.0 lb

## 2020-03-26 DIAGNOSIS — Z1331 Encounter for screening for depression: Secondary | ICD-10-CM | POA: Diagnosis not present

## 2020-03-26 DIAGNOSIS — R7309 Other abnormal glucose: Secondary | ICD-10-CM

## 2020-03-26 DIAGNOSIS — R0602 Shortness of breath: Secondary | ICD-10-CM | POA: Diagnosis not present

## 2020-03-26 DIAGNOSIS — R5383 Other fatigue: Secondary | ICD-10-CM

## 2020-03-26 DIAGNOSIS — Z6841 Body Mass Index (BMI) 40.0 and over, adult: Secondary | ICD-10-CM

## 2020-03-26 DIAGNOSIS — E559 Vitamin D deficiency, unspecified: Secondary | ICD-10-CM | POA: Diagnosis not present

## 2020-03-26 DIAGNOSIS — F5081 Binge eating disorder: Secondary | ICD-10-CM

## 2020-03-26 DIAGNOSIS — M2550 Pain in unspecified joint: Secondary | ICD-10-CM | POA: Diagnosis not present

## 2020-03-26 DIAGNOSIS — L906 Striae atrophicae: Secondary | ICD-10-CM

## 2020-03-26 DIAGNOSIS — Z0289 Encounter for other administrative examinations: Secondary | ICD-10-CM

## 2020-03-26 DIAGNOSIS — G4733 Obstructive sleep apnea (adult) (pediatric): Secondary | ICD-10-CM

## 2020-03-26 DIAGNOSIS — Z9189 Other specified personal risk factors, not elsewhere classified: Secondary | ICD-10-CM

## 2020-03-26 DIAGNOSIS — Z87442 Personal history of urinary calculi: Secondary | ICD-10-CM

## 2020-03-26 DIAGNOSIS — N200 Calculus of kidney: Secondary | ICD-10-CM

## 2020-03-27 LAB — TSH: TSH: 2.06 u[IU]/mL (ref 0.450–4.500)

## 2020-03-27 LAB — INSULIN, RANDOM: INSULIN: 22.2 u[IU]/mL (ref 2.6–24.9)

## 2020-03-27 LAB — T3: T3, Total: 108 ng/dL (ref 71–180)

## 2020-03-27 LAB — T4, FREE: Free T4: 1.1 ng/dL (ref 0.82–1.77)

## 2020-03-31 NOTE — Progress Notes (Signed)
Dear Tiffany Amble, PA-C,   Thank you for referring [name] to our clinic. The following note includes my evaluation and treatment recommendations.  Chief Complaint:   OBESITY [name] (MR# [id number]) is a 40 y.o. female who presents for evaluation and treatment of obesity and related comorbidities. Current BMI is Body mass index is 51.49 kg/m. [name] has been struggling with her weight for many years and has been unsuccessful in either losing weight, maintaining weight loss, or reaching her healthy weight goal.  [name] is currently in the action stage of change and ready to dedicate time achieving and maintaining a healthier weight. [name] is interested in becoming our patient and working on intensive lifestyle modifications including (but not limited to) diet and exercise for weight loss.  [name] does not like to cook, and state she is not good at it and is fatigued after work. She snacks on carbohydrates.  [name]'s habits were reviewed today and are as follows: Her family eats meals together, she thinks her family will eat healthier with her, she struggles with family and or coworkers weight loss sabotage, her desired weight loss is 120 lbs, she has been heavy most of her life, she started gaining weight after the birth of her 1st child, her heaviest weight ever was 306 pounds, she has significant food cravings issues, she snacks frequently in the evenings, she skips meals frequently, she is frequently drinking liquids with calories, she frequently makes poor food choices, she has problems with excessive hunger, she frequently eats larger portions than normal, she has binge eating behaviors and she struggles with emotional eating.  Depression Screen [name]'s Food and Mood (modified PHQ-9) score was 15.  Depression screen [facility] 2/9 03/26/2020  Decreased Interest 2  Down, Depressed, Hopeless 0  PHQ - 2 Score 2  Altered sleeping 1  Tired, decreased energy 3   Change in appetite 1  Feeling bad or failure about yourself  2  Trouble concentrating 3  Moving slowly or fidgety/restless 3  Suicidal thoughts 0  PHQ-9 Score 15  Difficult doing work/chores Somewhat difficult   Subjective:   1. Other fatigue [name] admits to daytime somnolence and admits to waking up still tired. Patent has a history of symptoms of morning fatigue. [name] generally gets 6 hours of sleep per night, and states that she has generally restful sleep. Snoring is present. Apneic episodes are present. Epworth Sleepiness Score is 3.  2. SOB (shortness of breath) on exertion [name] notes increasing shortness of breath with exercising and seems to be worsening over time with weight gain. She notes getting out of breath sooner with activity than she used to. This has gotten worse recently. [name] denies shortness of breath at rest or orthopnea.  3. Arthralgia, unspecified joint Pt reports knee and sometimes other joint pain.  4. Vitamin D deficiency [name] is taking OTC Vit D 1000 units daily.  5. Elevated glucose Denies polyphagia.   6. Striae Increased weight gain and increased fat in face and neck.   7. OSA (obstructive sleep apnea) Pt has a diagnosis of OSA and wears a CPAP.  8. History of renal calculi Hx of 2 episodes.  9. Binge eating disorder [name] states she struggles with binge eating on weekends and eating at night. Her PHQ-9 score is 15.  10. At risk for activity intolerance [name] is at risk for exercise intolerance due to obesity and inclement weather.  Assessment/Plan:   1. Other fatigue [name] does feel that  her weight is causing her energy to be lower than it should be. Fatigue may be related to obesity, depression or many other causes. Labs will be ordered, and in the meanwhile, [name] will focus on self care including making healthy food choices, increasing physical activity and focusing on stress reduction. - EKG 12-Lead -  T3 - T4, free - TSH  2. SOB (shortness of breath) on exertion [name] does feel that she gets out of breath more easily that she used to when she exercises. [name]'s shortness of breath appears to be obesity related and exercise induced. She has agreed to work on weight loss and gradually increase exercise to treat her exercise induced shortness of breath. Will continue to monitor closely.  3. Arthralgia, unspecified joint [name] will follow up with Rheumatology.  4. Vitamin D deficiency Low Vitamin D level contributes to fatigue and are associated with obesity, breast, and colon cancer. She agrees to continue to take OTC Vitamin D @1 ,000 IU daily and will follow-up for routine testing of Vitamin D, at least 2-3 times per year to avoid over-replacement.  5. Elevated glucose Fasting labs will be obtained and results with be discussed with in 2 weeks at her follow up visit. In the meanwhile [name] was started on a lower simple carbohydrate diet and will work on weight loss efforts.  - Insulin, random  6. Striae She states that she has had rapid weight gain, and increased fat in her neck and face.   - Cortisol, urine, free  7. OSA (obstructive sleep apnea) Intensive lifestyle modifications are the first line treatment for this issue. We discussed several lifestyle modifications today and she will continue to work on diet, exercise and weight loss efforts. We will continue to monitor. Orders and follow up as documented in patient record. Continue CPAP use nightly.  8. History of renal calculi Made aware that certain medications can increase her risk. Increase water.   9. Binge eating disorder Discussed CBT techniques and handout given.  10. Depression screening [name] had a positive depression screening. Depression is commonly associated with obesity and often results in emotional eating behaviors. We will monitor this closely and work on CBT to help improve the  non-hunger eating patterns. Referral to Psychology may be required if no improvement is seen as she continues in our clinic.  11. At risk for activity intolerance [name] was given approximately 15 minutes of exercise intolerance counseling today. She is 40 y.o. female and has risk factors exercise intolerance including obesity. We discussed intensive lifestyle modifications today with an emphasis on specific weight loss instructions and strategies. Jaylissa will slowly increase activity as tolerated.  Repetitive spaced learning was employed today to elicit superior memory formation and behavioral change.  12. Class 3 severe obesity with serious comorbidity and body mass index (BMI) of 50.0 to 59.9 in adult, unspecified obesity type (HCC) [name] is currently in the action stage of change and her goal is to continue with weight loss efforts. I recommend [name] begin the structured treatment plan as follows:  [name] will focus on meal plan and intentional eating. Review labs 12/27/2019: A1c, Vit D.  She has agreed to the Category 3 Plan.  Exercise goals: No exercise has been prescribed at this time.   Behavioral modification strategies: increasing lean protein intake, decreasing simple carbohydrates, increasing vegetables, increasing water intake, decreasing eating out, no skipping meals, meal planning and cooking strategies, keeping healthy foods in the home and planning for success.  She was informed  of the importance of frequent follow-up visits to maximize her success with intensive lifestyle modifications for her multiple health conditions. She was informed we would discuss her lab results at her next visit unless there is a critical issue that needs to be addressed sooner. [name] agreed to keep her next visit at the agreed upon time to discuss these results.  Objective:   Blood pressure 128/84, pulse 73, temperature 98 F (36.7 C), height 5\' 4"  (1.626 m), weight 300 lb (136.1 kg),  last menstrual period 03/04/2020, SpO2 99 %. Body mass index is 51.49 kg/m.  EKG: Normal sinus rhythm, rate 77.  Indirect Calorimeter completed today shows a VO2 of 329 and a REE of 2290.  Her calculated basal metabolic rate is 03/06/2020 thus her basal metabolic rate is better than expected.  General: Cooperative, alert, well developed, in no acute distress. HEENT: Conjunctivae and lids unremarkable. Cardiovascular: Regular rhythm.  Lungs: Normal work of breathing. Neurologic: No focal deficits.   Lab Results  Component Value Date   CREATININE 0.70 04/16/2016   BUN 16 04/16/2016   NA 140 04/16/2016   K 4.1 04/16/2016   CL 104 04/16/2016   CO2 23 01/29/2014   Lab Results  Component Value Date   ALT 11 01/29/2014   AST 13 01/29/2014   ALKPHOS 46 01/29/2014   BILITOT 0.2 (L) 01/29/2014   No results found for: HGBA1C Lab Results  Component Value Date   INSULIN 22.2 03/26/2020   Lab Results  Component Value Date   TSH 2.060 03/26/2020   No results found for: CHOL, HDL, LDLCALC, LDLDIRECT, TRIG, CHOLHDL Lab Results  Component Value Date   WBC 8.0 12/15/2015   HGB 12.2 04/16/2016   HCT 36.0 04/16/2016   MCV 90.2 12/15/2015   PLT 316 12/15/2015   No results found for: IRON, TIBC, FERRITIN  Attestation Statements:   Reviewed by clinician on day of visit: allergies, medications, problem list, medical history, surgical history, family history, social history, and previous encounter notes.  12/17/2015, am acting as Edmund Hilda for Energy manager, DO.  I have reviewed the above documentation for accuracy and completeness, and I agree with the above. [employer], DO

## 2020-04-01 ENCOUNTER — Encounter (INDEPENDENT_AMBULATORY_CARE_PROVIDER_SITE_OTHER): Payer: Self-pay | Admitting: Bariatrics

## 2020-04-01 DIAGNOSIS — G4733 Obstructive sleep apnea (adult) (pediatric): Secondary | ICD-10-CM | POA: Insufficient documentation

## 2020-04-01 DIAGNOSIS — N200 Calculus of kidney: Secondary | ICD-10-CM | POA: Insufficient documentation

## 2020-04-01 HISTORY — DX: Calculus of kidney: N20.0

## 2020-04-09 ENCOUNTER — Other Ambulatory Visit: Payer: Self-pay

## 2020-04-09 ENCOUNTER — Ambulatory Visit (INDEPENDENT_AMBULATORY_CARE_PROVIDER_SITE_OTHER): Payer: 59 | Admitting: Bariatrics

## 2020-04-09 ENCOUNTER — Encounter (INDEPENDENT_AMBULATORY_CARE_PROVIDER_SITE_OTHER): Payer: Self-pay | Admitting: Bariatrics

## 2020-04-09 VITALS — BP 137/85 | HR 74 | Temp 97.9°F | Ht 64.0 in | Wt 300.0 lb

## 2020-04-09 DIAGNOSIS — G4733 Obstructive sleep apnea (adult) (pediatric): Secondary | ICD-10-CM

## 2020-04-09 DIAGNOSIS — Z9189 Other specified personal risk factors, not elsewhere classified: Secondary | ICD-10-CM | POA: Diagnosis not present

## 2020-04-09 DIAGNOSIS — R632 Polyphagia: Secondary | ICD-10-CM | POA: Diagnosis not present

## 2020-04-09 DIAGNOSIS — E8881 Metabolic syndrome: Secondary | ICD-10-CM

## 2020-04-09 DIAGNOSIS — Z6841 Body Mass Index (BMI) 40.0 and over, adult: Secondary | ICD-10-CM

## 2020-04-09 MED ORDER — METFORMIN HCL 500 MG PO TABS: 500.0000 mg | ORAL_TABLET | Freq: Two times a day (BID) | ORAL | 0 refills | Status: DC

## 2020-04-10 ENCOUNTER — Encounter (INDEPENDENT_AMBULATORY_CARE_PROVIDER_SITE_OTHER): Payer: Self-pay | Admitting: Bariatrics

## 2020-04-10 NOTE — Progress Notes (Signed)
Chief Complaint:   OBESITY [name] is here to discuss her progress with her obesity treatment plan along with follow-up of her obesity related diagnoses. [name] is on the Category 3 Plan and states she is following her eating plan approximately 95% of the time. [name] states she is not exercising at this time.  Today's visit was #: 2 Starting weight: 300 lbs Starting date: 03/26/2020 Today's weight: 300 lbs Today's date: 04/09/2020 Total lbs lost to date: 0 Total lbs lost since last in-office visit: 0  Interim History: [name]'s weight remains the same.  She is surprised about the lack of weight loss.  Subjective:   1. Insulin resistance [name] has a diagnosis of insulin resistance based on her elevated fasting insulin level >5. She continues to work on diet and exercise to decrease her risk of diabetes.  Insulin level 22.2.  Lab Results  Component Value Date   INSULIN 22.2 03/26/2020   2. OSA (obstructive sleep apnea) [name] has a diagnosis of sleep apnea. She reports that she is using a CPAP regularly.   3. Polyphagia [name] endorses excessive hunger.  She says she is hungry in the afternoon most days.   4. At risk for diabetes mellitus [name] is at higher than average risk for developing diabetes due to obesity, insulin resistance, and polyphagia.   Assessment/Plan:   1. Insulin resistance [name] will continue to work on weight loss, exercise, and decreasing simple carbohydrates to help decrease the risk of diabetes. [name] agreed to follow-up with [name] as directed to closely monitor her progress.  See below (metformin).  Increase activity.  2. OSA (obstructive sleep apnea) Intensive lifestyle modifications are the first line treatment for this issue. We discussed several lifestyle modifications today and she will continue to work on diet, exercise and weight loss efforts. We will continue to monitor. Orders and follow up as documented in patient record.   Continue CPAP.  3. Polyphagia Intensive lifestyle modifications are the first line treatment for this issue. We discussed several lifestyle modifications today and she will continue to work on diet, exercise and weight loss efforts. Orders and follow up as documented in patient record.  Start metformin 500 mg twice daily, as pre below.  Counseling . Polyphagia is excessive hunger. . Causes can include: low blood sugars, hypERthyroidism, PMS, lack of sleep, stress, insulin resistance, diabetes, certain medications, and diets that are deficient in protein and fiber.   - Start metFORMIN (GLUCOPHAGE) 500 MG tablet; Take 1 tablet (500 mg total) by mouth 2 (two) times daily with a meal.  Dispense: 60 tablet; Refill: 0  4. At risk for diabetes mellitus [name] was given approximately 15 minutes of diabetes education and counseling today. We discussed intensive lifestyle modifications today with an emphasis on weight loss as well as increasing exercise and decreasing simple carbohydrates in her diet. We also reviewed medication options with an emphasis on risk versus benefit of those discussed.   Repetitive spaced learning was employed today to elicit superior memory formation and behavioral change.  5. Class 3 severe obesity with serious comorbidity and body mass index (BMI) of 50.0 to 59.9 in adult, unspecified obesity type (HCC)  [name] is currently in the action stage of change. As such, her goal is to continue with weight loss efforts. She has agreed to the Category 3 Plan.   She will work on meal planning, increasing water intake.  Handout provided:  "[employer] [place] [employer]".  Reviewed insulin level and thyroid panel from 03/26/2020.  A 24-hour free cortisol level was ordered, but was not done.   Exercise goals: No exercise has been prescribed at this time.  Behavioral modification strategies: increasing lean protein intake, decreasing simple carbohydrates, increasing vegetables, increasing water  intake, decreasing eating out, no skipping meals, meal planning and cooking strategies, keeping healthy foods in the home and planning for success.  [name] has agreed to follow-up with our clinic in 2 weeks. She was informed of the importance of frequent follow-up visits to maximize her success with intensive lifestyle modifications for her multiple health conditions.   Objective:   Blood pressure 137/85, pulse 74, temperature 97.9 F (36.6 C), height 5\' 4"  (1.626 m), weight 300 lb (136.1 kg), SpO2 99 %. Body mass index is 51.49 kg/m.  General: Cooperative, alert, well developed, in no acute distress. HEENT: Conjunctivae and lids unremarkable. Cardiovascular: Regular rhythm.  Lungs: Normal work of breathing. Neurologic: No focal deficits.   Lab Results  Component Value Date   CREATININE 0.70 04/16/2016   BUN 16 04/16/2016   NA 140 04/16/2016   K 4.1 04/16/2016   CL 104 04/16/2016   CO2 23 01/29/2014   Lab Results  Component Value Date   ALT 11 01/29/2014   AST 13 01/29/2014   ALKPHOS 46 01/29/2014   BILITOT 0.2 (L) 01/29/2014   Lab Results  Component Value Date   INSULIN 22.2 03/26/2020   Lab Results  Component Value Date   TSH 2.060 03/26/2020   Lab Results  Component Value Date   WBC 8.0 12/15/2015   HGB 12.2 04/16/2016   HCT 36.0 04/16/2016   MCV 90.2 12/15/2015   PLT 316 12/15/2015   Attestation Statements:   Reviewed by clinician on day of visit: allergies, medications, problem list, medical history, surgical history, family history, social history, and previous encounter notes.  I, 12/17/2015, CMA, am acting as Insurance claims handler for Energy manager, DO  I have reviewed the above documentation for accuracy and completeness, and I agree with the above. [employer], DO

## 2020-04-11 LAB — CORTISOL, URINE, FREE
Cortisol (Ur), Free: 26 ug/24 hr (ref 6–42)
Cortisol,F,ug/L,U: 17 ug/L

## 2020-04-14 NOTE — Progress Notes (Signed)
Pt advised.

## 2020-04-24 ENCOUNTER — Encounter (INDEPENDENT_AMBULATORY_CARE_PROVIDER_SITE_OTHER): Payer: Self-pay | Admitting: Bariatrics

## 2020-04-24 ENCOUNTER — Ambulatory Visit (INDEPENDENT_AMBULATORY_CARE_PROVIDER_SITE_OTHER): Payer: 59 | Admitting: Bariatrics

## 2020-04-24 ENCOUNTER — Other Ambulatory Visit: Payer: Self-pay

## 2020-04-24 VITALS — BP 138/83 | HR 68 | Temp 97.7°F | Ht 64.0 in | Wt 293.0 lb

## 2020-04-24 DIAGNOSIS — Z6841 Body Mass Index (BMI) 40.0 and over, adult: Secondary | ICD-10-CM

## 2020-04-24 DIAGNOSIS — E8881 Metabolic syndrome: Secondary | ICD-10-CM | POA: Diagnosis not present

## 2020-04-24 DIAGNOSIS — R632 Polyphagia: Secondary | ICD-10-CM | POA: Diagnosis not present

## 2020-04-28 NOTE — Progress Notes (Signed)
Chief Complaint:   OBESITY [name] is here to discuss her progress with her obesity treatment plan along with follow-up of her obesity related diagnoses. [name] is on the Category 3 Plan and states she is following her eating plan approximately 95% of the time. [name] states she is walking 15 minutes 4 times per week.  Today's visit was #: 3 Starting weight: 300 lbs Starting date: 03/26/2020 Today's weight: 293 lbs Today's date: 04/24/2020 Total lbs lost to date: 7 lbs Total lbs lost since last in-office visit: 7 lbs  Interim History: [name]'s weight decreased by 7 lbs since her last visit. She has been weighing her meat.  Subjective:   1. Insulin resistance [name] is taking Metformin.  Lab Results  Component Value Date   INSULIN 22.2 03/26/2020    2. Polyphagia Increase protein, which is helpful.  Assessment/Plan:   1. Insulin resistance [name] will continue to work on weight loss, exercise, and decreasing simple carbohydrates to help decrease the risk of diabetes. [name] agreed to follow-up with [name] as directed to closely monitor her progress. Continue Metformin.  2. Polyphagia Intensive lifestyle modifications are the first line treatment for this issue. We discussed several lifestyle modifications today and she will continue to work on diet, exercise and weight loss efforts. Orders and follow up as documented in patient record. Continue to decrease carbohydrates. Increase protein.   Counseling . Polyphagia is excessive hunger. . Causes can include: low blood sugars, hypERthyroidism, PMS, lack of sleep, stress, insulin resistance, diabetes, certain medications, and diets that are deficient in protein and fiber.   3. Class 3 severe obesity with serious comorbidity and body mass index (BMI) of 50.0 to 59.9 in adult, unspecified obesity type (HCC) [name] is currently in the action stage of change. As such, her goal is to continue with weight loss efforts.  She has agreed to the Category 3 Plan.   Continue to weigh meat Increase protein overall  Exercise goals: Increase walking  Behavioral modification strategies: increasing lean protein intake, decreasing simple carbohydrates, increasing vegetables, increasing water intake, decreasing eating out, no skipping meals, meal planning and cooking strategies, keeping healthy foods in the home and planning for success.  [name] has agreed to follow-up with our clinic in 2 weeks. She was informed of the importance of frequent follow-up visits to maximize her success with intensive lifestyle modifications for her multiple health conditions.   Objective:   Blood pressure 138/83, pulse 68, temperature 97.7 F (36.5 C), height 5\' 4"  (1.626 m), weight 293 lb (132.9 kg), last menstrual period 04/21/2020, SpO2 98 %. Body mass index is 50.29 kg/m.  General: Cooperative, alert, well developed, in no acute distress. HEENT: Conjunctivae and lids unremarkable. Cardiovascular: Regular rhythm.  Lungs: Normal work of breathing. Neurologic: No focal deficits.   Lab Results  Component Value Date   CREATININE 0.70 04/16/2016   BUN 16 04/16/2016   NA 140 04/16/2016   K 4.1 04/16/2016   CL 104 04/16/2016   CO2 23 01/29/2014   Lab Results  Component Value Date   ALT 11 01/29/2014   AST 13 01/29/2014   ALKPHOS 46 01/29/2014   BILITOT 0.2 (L) 01/29/2014   No results found for: HGBA1C Lab Results  Component Value Date   INSULIN 22.2 03/26/2020   Lab Results  Component Value Date   TSH 2.060 03/26/2020   No results found for: CHOL, HDL, LDLCALC, LDLDIRECT, TRIG, CHOLHDL Lab Results  Component Value Date   WBC 8.0 12/15/2015  HGB 12.2 04/16/2016   HCT 36.0 04/16/2016   MCV 90.2 12/15/2015   PLT 316 12/15/2015    Attestation Statements:   Reviewed by clinician on day of visit: allergies, medications, problem list, medical history, surgical history, family history, social history, and  previous encounter notes.  Time spent on visit including pre-visit chart review and post-visit care and charting was 20 minutes.   Edmund Hilda, am acting as Energy manager for [employer], DO.  I have reviewed the above documentation for accuracy and completeness, and I agree with the above. Corinna Capra, DO

## 2020-04-29 ENCOUNTER — Encounter (INDEPENDENT_AMBULATORY_CARE_PROVIDER_SITE_OTHER): Payer: Self-pay | Admitting: Bariatrics

## 2020-05-01 ENCOUNTER — Other Ambulatory Visit (INDEPENDENT_AMBULATORY_CARE_PROVIDER_SITE_OTHER): Payer: Self-pay | Admitting: Bariatrics

## 2020-05-01 DIAGNOSIS — R632 Polyphagia: Secondary | ICD-10-CM

## 2020-05-07 ENCOUNTER — Ambulatory Visit (INDEPENDENT_AMBULATORY_CARE_PROVIDER_SITE_OTHER): Payer: 59 | Admitting: Bariatrics

## 2020-05-07 ENCOUNTER — Encounter (INDEPENDENT_AMBULATORY_CARE_PROVIDER_SITE_OTHER): Payer: Self-pay | Admitting: Bariatrics

## 2020-05-07 ENCOUNTER — Other Ambulatory Visit: Payer: Self-pay

## 2020-05-07 VITALS — BP 121/81 | HR 72 | Temp 97.9°F | Ht 64.0 in | Wt 296.0 lb

## 2020-05-07 DIAGNOSIS — Z9189 Other specified personal risk factors, not elsewhere classified: Secondary | ICD-10-CM | POA: Diagnosis not present

## 2020-05-07 DIAGNOSIS — E559 Vitamin D deficiency, unspecified: Secondary | ICD-10-CM

## 2020-05-07 DIAGNOSIS — R632 Polyphagia: Secondary | ICD-10-CM

## 2020-05-07 DIAGNOSIS — F509 Eating disorder, unspecified: Secondary | ICD-10-CM | POA: Diagnosis not present

## 2020-05-07 DIAGNOSIS — Z6841 Body Mass Index (BMI) 40.0 and over, adult: Secondary | ICD-10-CM

## 2020-05-07 DIAGNOSIS — R7301 Impaired fasting glucose: Secondary | ICD-10-CM | POA: Diagnosis not present

## 2020-05-07 MED ORDER — BUPROPION HCL ER (SR) 150 MG PO TB12: 150.0000 mg | ORAL_TABLET | Freq: Two times a day (BID) | ORAL | 0 refills | Status: DC

## 2020-05-13 ENCOUNTER — Encounter (INDEPENDENT_AMBULATORY_CARE_PROVIDER_SITE_OTHER): Payer: Self-pay | Admitting: Bariatrics

## 2020-05-13 NOTE — Progress Notes (Signed)
Chief Complaint:   OBESITY [name] is here to discuss her progress with her obesity treatment plan along with follow-up of her obesity related diagnoses. [name] is on the Category 3 Plan and states she is following her eating plan approximately 50% of the time. [name] states she is walking for 15 minutes 3-4 times per week.  Today's visit was #: 4 Starting weight: 300 lbs Starting date: 03/26/2020 Today's weight: 296 lbs Today's date: 05/07/2020 Total lbs lost to date: 4 lbs Total lbs lost since last in-office visit: 0  Interim History: [name] is up 3 pounds since her last visit.  She has been ill with viral signs and symptoms for about 1 week.  She is weighing her meat.  Subjective:   1. Polyphagia [name] was started on metformin, but stopped it due to nausea.  2. Impaired fasting glucose With metabolic syndrome.  She was on metformin, but stopped it due to nausea.  3. Vitamin D deficiency She is currently taking OTC vitamin D 1,000 IU each day. She denies nausea, vomiting or muscle weakness.  4. Eating disorder, unspecified type With emotional stress.  5. At risk for osteoporosis [name] is at higher risk of osteopenia and osteoporosis due to Vitamin D deficiency.   Assessment/Plan:   1. Polyphagia Intensive lifestyle modifications are the first line treatment for this issue. We discussed several lifestyle modifications today and she will continue to work on diet, exercise and weight loss efforts. Orders and follow up as documented in patient record.    Counseling . Polyphagia is excessive hunger. . Causes can include: low blood sugars, hypERthyroidism, PMS, lack of sleep, stress, insulin resistance, diabetes, certain medications, and diets that are deficient in protein and fiber.   2. Impaired fasting glucose No longer taking metformin due to side effect of nausea.  3. Vitamin D deficiency Low Vitamin D level contributes to fatigue and are associated  with obesity, breast, and colon cancer. She agrees to continue to take OTC vitamin D 1,000 IU daily.  4. Eating disorder, unspecified type She will start Wellbutrin 150 mg twice daily, as per below.  - Start buPROPion (WELLBUTRIN SR) 150 MG 12 hr tablet; Take 1 tablet (150 mg total) by mouth 2 (two) times daily.  Dispense: 39 tablet; Refill: 0  5. At risk for osteoporosis [name] was given approximately 15 minutes of osteoporosis prevention counseling today. [name] is at risk for osteopenia and osteoporosis due to her Vitamin D deficiency. She was encouraged to take her Vitamin D and follow her higher calcium diet and increase strengthening exercise to help strengthen her bones and decrease her risk of osteopenia and osteoporosis.  Repetitive spaced learning was employed today to elicit superior memory formation and behavioral change.  6. Class 3 severe obesity with serious comorbidity and body mass index (BMI) of 50.0 to 59.9 in adult, unspecified obesity type (HCC)  [name] is currently in the action stage of change. As such, her goal is to continue with weight loss efforts. She has agreed to the Category 2 Plan.   She will work on adhering closely to the plan, meal planning, and increasing her water intake.  Exercise goals: For substantial health benefits, adults should do at least 150 minutes (2 hours and 30 minutes) a week of moderate-intensity, or 75 minutes (1 hour and 15 minutes) a week of vigorous-intensity aerobic physical activity, or an equivalent combination of moderate- and vigorous-intensity aerobic activity. Aerobic activity should be performed in episodes of at least 10  minutes, and preferably, it should be spread throughout the week.  Behavioral modification strategies: increasing lean protein intake, decreasing simple carbohydrates, increasing vegetables, increasing water intake, decreasing eating out, no skipping meals, meal planning and cooking strategies, keeping healthy  foods in the home and planning for success.  [name] has agreed to follow-up with our clinic in 2 weeks. She was informed of the importance of frequent follow-up visits to maximize her success with intensive lifestyle modifications for her multiple health conditions.   Objective:   Blood pressure 121/81, pulse 72, temperature 97.9 F (36.6 C), height 5\' 4"  (1.626 m), weight 296 lb (134.3 kg), last menstrual period 04/21/2020, SpO2 98 %. Body mass index is 50.81 kg/m.  General: Cooperative, alert, well developed, in no acute distress. HEENT: Conjunctivae and lids unremarkable. Cardiovascular: Regular rhythm.  Lungs: Normal work of breathing. Neurologic: No focal deficits.   Lab Results  Component Value Date   CREATININE 0.70 04/16/2016   BUN 16 04/16/2016   NA 140 04/16/2016   K 4.1 04/16/2016   CL 104 04/16/2016   CO2 23 01/29/2014   Lab Results  Component Value Date   ALT 11 01/29/2014   AST 13 01/29/2014   ALKPHOS 46 01/29/2014   BILITOT 0.2 (L) 01/29/2014   Lab Results  Component Value Date   INSULIN 22.2 03/26/2020   Lab Results  Component Value Date   TSH 2.060 03/26/2020   Lab Results  Component Value Date   WBC 8.0 12/15/2015   HGB 12.2 04/16/2016   HCT 36.0 04/16/2016   MCV 90.2 12/15/2015   PLT 316 12/15/2015   Attestation Statements:   Reviewed by clinician on day of visit: allergies, medications, problem list, medical history, surgical history, family history, social history, and previous encounter notes.  I, 12/17/2015, CMA, am acting as Insurance claims handler for Energy manager, DO  I have reviewed the above documentation for accuracy and completeness, and I agree with the above. [employer], DO

## 2020-05-22 ENCOUNTER — Encounter (INDEPENDENT_AMBULATORY_CARE_PROVIDER_SITE_OTHER): Payer: Self-pay | Admitting: Bariatrics

## 2020-05-22 ENCOUNTER — Ambulatory Visit (INDEPENDENT_AMBULATORY_CARE_PROVIDER_SITE_OTHER): Payer: 59 | Admitting: Bariatrics

## 2020-05-22 ENCOUNTER — Other Ambulatory Visit: Payer: Self-pay

## 2020-05-22 VITALS — BP 128/87 | HR 78 | Temp 97.9°F | Ht 64.0 in | Wt 290.0 lb

## 2020-05-22 DIAGNOSIS — E559 Vitamin D deficiency, unspecified: Secondary | ICD-10-CM | POA: Diagnosis not present

## 2020-05-22 DIAGNOSIS — E8881 Metabolic syndrome: Secondary | ICD-10-CM

## 2020-05-22 DIAGNOSIS — R632 Polyphagia: Secondary | ICD-10-CM

## 2020-05-22 DIAGNOSIS — Z6841 Body Mass Index (BMI) 40.0 and over, adult: Secondary | ICD-10-CM

## 2020-05-22 DIAGNOSIS — Z9189 Other specified personal risk factors, not elsewhere classified: Secondary | ICD-10-CM

## 2020-05-22 MED ORDER — TRULICITY 0.75 MG/0.5ML ~~LOC~~ SOAJ: 0.7500 mg | SUBCUTANEOUS | 0 refills | Status: DC

## 2020-05-26 NOTE — Progress Notes (Signed)
Chief Complaint:   OBESITY [name] is here to discuss her progress with her obesity treatment plan along with follow-up of her obesity related diagnoses. [name] is on the Category 2 Plan and states she is following her eating plan approximately 75-80% of the time. [name] states she is not currently exercising.  Today's visit was #: 5 Starting weight: 300 lbs Starting date: 03/26/2020 Today's weight: 290 lbs Today's date: 05/22/2020 Total lbs lost to date: 10 Total lbs lost since last in-office visit: 6  Interim History: [name] is down an additional 6 lbs and doing well.  Subjective:   1. Vitamin D deficiency [name] is taking OTC Vit D 1,000 IU daily.  2. Polyphagia [name] is taking Glucophage. She reports increased appetite.  3. Insulin resistance [name] reports polyphagia.  4. At risk for diabetes mellitus [name] is at higher than average risk for developing diabetes due to obesity and insulin resistance.  Assessment/Plan:   1. Vitamin D deficiency Low Vitamin D level contributes to fatigue and are associated with obesity, breast, and colon cancer. She agrees to continue to take OTC Vitamin D @ 1,000 IU daily and will follow-up for routine testing of Vitamin D, at least 2-3 times per year to avoid over-replacement.  2. Polyphagia Intensive lifestyle modifications are the first line treatment for this issue. We discussed several lifestyle modifications today and she will continue to work on diet, exercise and weight loss efforts. Orders and follow up as documented in patient record. Continue current treatment plan.  Counseling . Polyphagia is excessive hunger. . Causes can include: low blood sugars, hypERthyroidism, PMS, lack of sleep, stress, insulin resistance, diabetes, certain medications, and diets that are deficient in protein and fiber.   3. Insulin resistance [name] will continue to work on weight loss, exercise, and decreasing simple  carbohydrates to help decrease the risk of diabetes. [name] agreed to follow-up with [name] as directed to closely monitor her progress.  - Dulaglutide (TRULICITY) 0.75 MG/0.5ML SOPN; Inject 0.75 mg into the skin once a week.  Dispense: 2 mL; Refill: 0  4. At risk for diabetes mellitus [name] was given approximately 15 minutes of diabetes education and counseling today. We discussed intensive lifestyle modifications today with an emphasis on weight loss as well as increasing exercise and decreasing simple carbohydrates in her diet. We also reviewed medication options with an emphasis on risk versus benefit of those discussed.   Repetitive spaced learning was employed today to elicit superior memory formation and behavioral change.  5. Obesity, current BMI 49 [name] is currently in the action stage of change. As such, her goal is to continue with weight loss efforts. She has agreed to the Category 2 Plan.   Exercise goals: As is  Behavioral modification strategies: increasing lean protein intake, decreasing simple carbohydrates, increasing vegetables, increasing water intake, decreasing eating out, no skipping meals, meal planning and cooking strategies, keeping healthy foods in the home and planning for success.  [name] has agreed to follow-up with our clinic in 2 weeks. She was informed of the importance of frequent follow-up visits to maximize her success with intensive lifestyle modifications for her multiple health conditions.   Objective:   Blood pressure 128/87, pulse 78, temperature 97.9 F (36.6 C), height 5\' 4"  (1.626 m), weight 290 lb (131.5 kg), SpO2 97 %. Body mass index is 49.78 kg/m.  General: Cooperative, alert, well developed, in no acute distress. HEENT: Conjunctivae and lids unremarkable. Cardiovascular: Regular rhythm.  Lungs: Normal work of breathing.  Neurologic: No focal deficits.   Lab Results  Component Value Date   CREATININE 0.70 04/16/2016   BUN 16  04/16/2016   NA 140 04/16/2016   K 4.1 04/16/2016   CL 104 04/16/2016   CO2 23 01/29/2014   Lab Results  Component Value Date   ALT 11 01/29/2014   AST 13 01/29/2014   ALKPHOS 46 01/29/2014   BILITOT 0.2 (L) 01/29/2014   No results found for: HGBA1C Lab Results  Component Value Date   INSULIN 22.2 03/26/2020   Lab Results  Component Value Date   TSH 2.060 03/26/2020   No results found for: CHOL, HDL, LDLCALC, LDLDIRECT, TRIG, CHOLHDL Lab Results  Component Value Date   WBC 8.0 12/15/2015   HGB 12.2 04/16/2016   HCT 36.0 04/16/2016   MCV 90.2 12/15/2015   PLT 316 12/15/2015     Attestation Statements:   Reviewed by clinician on day of visit: allergies, medications, problem list, medical history, surgical history, family history, social history, and previous encounter notes.  Edmund Hilda, am acting as Energy manager for [employer], DO.  I have reviewed the above documentation for accuracy and completeness, and I agree with the above. Corinna Capra, DO

## 2020-05-27 ENCOUNTER — Encounter (INDEPENDENT_AMBULATORY_CARE_PROVIDER_SITE_OTHER): Payer: Self-pay | Admitting: Bariatrics

## 2020-06-12 ENCOUNTER — Other Ambulatory Visit (INDEPENDENT_AMBULATORY_CARE_PROVIDER_SITE_OTHER): Payer: Self-pay | Admitting: Bariatrics

## 2020-06-12 DIAGNOSIS — F509 Eating disorder, unspecified: Secondary | ICD-10-CM

## 2020-06-16 ENCOUNTER — Ambulatory Visit (INDEPENDENT_AMBULATORY_CARE_PROVIDER_SITE_OTHER): Payer: 59 | Admitting: Bariatrics

## 2020-06-16 ENCOUNTER — Encounter (INDEPENDENT_AMBULATORY_CARE_PROVIDER_SITE_OTHER): Payer: Self-pay | Admitting: Bariatrics

## 2020-06-16 ENCOUNTER — Other Ambulatory Visit: Payer: Self-pay

## 2020-06-16 VITALS — BP 123/87 | HR 79 | Temp 98.4°F | Ht 64.0 in | Wt 290.0 lb

## 2020-06-16 DIAGNOSIS — F5089 Other specified eating disorder: Secondary | ICD-10-CM | POA: Diagnosis not present

## 2020-06-16 DIAGNOSIS — E8881 Metabolic syndrome: Secondary | ICD-10-CM | POA: Diagnosis not present

## 2020-06-16 DIAGNOSIS — Z9189 Other specified personal risk factors, not elsewhere classified: Secondary | ICD-10-CM

## 2020-06-16 DIAGNOSIS — Z6841 Body Mass Index (BMI) 40.0 and over, adult: Secondary | ICD-10-CM

## 2020-06-16 MED ORDER — BUPROPION HCL ER (SR) 150 MG PO TB12: 150.0000 mg | ORAL_TABLET | Freq: Every day | ORAL | 0 refills | Status: DC

## 2020-06-16 MED ORDER — TRULICITY 1.5 MG/0.5ML ~~LOC~~ SOAJ: 1.5000 mg | SUBCUTANEOUS | 0 refills | Status: DC

## 2020-06-16 NOTE — Telephone Encounter (Signed)
Pt last seen by Dr. Brown.  

## 2020-06-16 NOTE — Telephone Encounter (Signed)
DR Brown 

## 2020-06-17 ENCOUNTER — Other Ambulatory Visit: Payer: Self-pay | Admitting: Physician Assistant

## 2020-06-17 DIAGNOSIS — Z1231 Encounter for screening mammogram for malignant neoplasm of breast: Secondary | ICD-10-CM

## 2020-06-17 NOTE — Progress Notes (Signed)
Chief Complaint:   OBESITY [name] is here to discuss her progress with her obesity treatment plan along with follow-up of her obesity related diagnoses. [name] is on the Category 2 Plan and states she is following her eating plan approximately 75% of the time. [name] states she is doing 0 minutes 0 times per week.  Today's visit was #: 6 Starting weight: 300 lbs Starting date: 03/26/2020 Today's weight: 290 lbs Today's date: 06/16/2020 Total lbs lost to date: 10 Total lbs lost since last in-office visit: 0  Interim History: [name]'s weight remains the same. She is exhausted due to not sleeping and more events.  Subjective:   1. Insulin resistance [name] is taking Trulicity. She notes she is not always satisfied after a meal.  2. Other disorder of eating [name] is currently taking Wellbutrin SR.  3. At risk for hypoglycemia [name] is at increased risk for hypoglycemia due to changes in diet, diagnosis of diabetes, and/or insulin use.   Assessment/Plan:   1. Insulin resistance [name] will continue Trulicity, but increase from 0.75 mg to 1.5 mg weekly. She continue to work on weight loss, exercise, and decreasing simple carbohydrates to help decrease the risk of diabetes. [name] agreed to follow-up with [name] as directed to closely monitor her progress.  - Dulaglutide (TRULICITY) 1.5 MG/0.5ML SOPN; Inject 1.5 mg into the skin once a week.  Dispense: 2 mL; Refill: 0  2. Other disorder of eating Behavior modification techniques were discussed today to help [name] deal with her emotional/non-hunger eating behaviors. We will refill Wellbutrin SR for 1 month. Orders and follow up as documented in patient record.   - buPROPion (WELLBUTRIN SR) 150 MG 12 hr tablet; Take 1 tablet (150 mg total) by mouth daily.  Dispense: 30 tablet; Refill: 0  3. At risk for hypoglycemia [name] was given approximately 15 minutes of counseling today regarding prevention of  hypoglycemia. She was advised of symptoms of hypoglycemia. [name] was instructed to avoid skipping meals, eat regular protein rich meals and schedule low calorie snacks as needed.   Repetitive spaced learning was employed today to elicit superior memory formation and behavioral change  4. Obesity with current BMI of 49 [name] is currently in the action stage of change. As such, her goal is to continue with weight loss efforts. She has agreed to the Category 2 Plan.   [name] will stay adherent to the meal plan.  Exercise goals: No exercise has been prescribed at this time.  Behavioral modification strategies: increasing lean protein intake, decreasing simple carbohydrates, increasing vegetables, increasing water intake, decreasing eating out, no skipping meals, meal planning and cooking strategies, keeping healthy foods in the home and planning for success.  [name] has agreed to follow-up with our clinic in 2 weeks. She was informed of the importance of frequent follow-up visits to maximize her success with intensive lifestyle modifications for her multiple health conditions.   Objective:   Blood pressure 123/87, pulse 79, temperature 98.4 F (36.9 C), height 5\' 4"  (1.626 m), weight 290 lb (131.5 kg), SpO2 97 %. Body mass index is 49.78 kg/m.  General: Cooperative, alert, well developed, in no acute distress. HEENT: Conjunctivae and lids unremarkable. Cardiovascular: Regular rhythm.  Lungs: Normal work of breathing. Neurologic: No focal deficits.   Lab Results  Component Value Date   CREATININE 0.70 04/16/2016   BUN 16 04/16/2016   NA 140 04/16/2016   K 4.1 04/16/2016   CL 104 04/16/2016   CO2 23 01/29/2014  Lab Results  Component Value Date   ALT 11 01/29/2014   AST 13 01/29/2014   ALKPHOS 46 01/29/2014   BILITOT 0.2 (L) 01/29/2014   No results found for: HGBA1C Lab Results  Component Value Date   INSULIN 22.2 03/26/2020   Lab Results  Component Value Date    TSH 2.060 03/26/2020   No results found for: CHOL, HDL, LDLCALC, LDLDIRECT, TRIG, CHOLHDL Lab Results  Component Value Date   WBC 8.0 12/15/2015   HGB 12.2 04/16/2016   HCT 36.0 04/16/2016   MCV 90.2 12/15/2015   PLT 316 12/15/2015   No results found for: IRON, TIBC, FERRITIN  Attestation Statements:   Reviewed by clinician on day of visit: allergies, medications, problem list, medical history, surgical history, family history, social history, and previous encounter notes.   Trude Mcburney, am acting as Energy manager for [employer], DO.  I have reviewed the above documentation for accuracy and completeness, and I agree with the above. Corinna Capra, DO

## 2020-06-17 NOTE — Telephone Encounter (Signed)
Please review and address 

## 2020-06-18 ENCOUNTER — Other Ambulatory Visit (INDEPENDENT_AMBULATORY_CARE_PROVIDER_SITE_OTHER): Payer: Self-pay | Admitting: Bariatrics

## 2020-06-18 ENCOUNTER — Ambulatory Visit (INDEPENDENT_AMBULATORY_CARE_PROVIDER_SITE_OTHER): Payer: 59 | Admitting: Bariatrics

## 2020-06-18 DIAGNOSIS — E8881 Metabolic syndrome: Secondary | ICD-10-CM

## 2020-06-18 NOTE — Telephone Encounter (Signed)
Pt last seen by Dr. Brown.  

## 2020-06-24 ENCOUNTER — Encounter (INDEPENDENT_AMBULATORY_CARE_PROVIDER_SITE_OTHER): Payer: Self-pay | Admitting: Bariatrics

## 2020-06-24 ENCOUNTER — Other Ambulatory Visit (INDEPENDENT_AMBULATORY_CARE_PROVIDER_SITE_OTHER): Payer: Self-pay | Admitting: Bariatrics

## 2020-06-24 DIAGNOSIS — F5089 Other specified eating disorder: Secondary | ICD-10-CM

## 2020-06-24 MED ORDER — BUPROPION HCL ER (SR) 150 MG PO TB12: 150.0000 mg | ORAL_TABLET | Freq: Every day | ORAL | 0 refills | Status: DC

## 2020-06-24 NOTE — Telephone Encounter (Signed)
Refill request

## 2020-06-30 ENCOUNTER — Other Ambulatory Visit: Payer: Self-pay

## 2020-06-30 ENCOUNTER — Encounter (INDEPENDENT_AMBULATORY_CARE_PROVIDER_SITE_OTHER): Payer: Self-pay | Admitting: Bariatrics

## 2020-06-30 ENCOUNTER — Ambulatory Visit (INDEPENDENT_AMBULATORY_CARE_PROVIDER_SITE_OTHER): Payer: 59 | Admitting: Bariatrics

## 2020-06-30 VITALS — BP 126/84 | HR 76 | Temp 98.3°F | Ht 64.0 in | Wt 288.0 lb

## 2020-06-30 DIAGNOSIS — R7301 Impaired fasting glucose: Secondary | ICD-10-CM | POA: Diagnosis not present

## 2020-06-30 DIAGNOSIS — R632 Polyphagia: Secondary | ICD-10-CM

## 2020-06-30 DIAGNOSIS — E559 Vitamin D deficiency, unspecified: Secondary | ICD-10-CM | POA: Diagnosis not present

## 2020-06-30 DIAGNOSIS — Z9189 Other specified personal risk factors, not elsewhere classified: Secondary | ICD-10-CM | POA: Diagnosis not present

## 2020-06-30 DIAGNOSIS — Z6841 Body Mass Index (BMI) 40.0 and over, adult: Secondary | ICD-10-CM

## 2020-06-30 MED ORDER — TRULICITY 3 MG/0.5ML ~~LOC~~ SOAJ: 3.0000 mg | SUBCUTANEOUS | 0 refills | Status: DC

## 2020-07-01 ENCOUNTER — Encounter (INDEPENDENT_AMBULATORY_CARE_PROVIDER_SITE_OTHER): Payer: Self-pay | Admitting: Bariatrics

## 2020-07-01 NOTE — Progress Notes (Signed)
Chief Complaint:   OBESITY [name] is here to discuss her progress with her obesity treatment plan along with follow-up of her obesity related diagnoses. [name] is on the Category 2 Plan and states she is following her eating plan approximately 95% of the time. [name] states she is not currently exercising.  Today's visit was #: 7 Starting weight: 300 lbs Starting date: 03/26/2020 Today's weight: 288 lbs Today's date: 06/30/2020 Total lbs lost to date: 12 lbs Total lbs lost since last in-office visit: 2  Interim History: [name] is down an additional 2 lbs since her last visit.  Subjective:   1. Impaired fasting glucose [name] is taking Trulicity at 1.5 mg.  2. Polyphagia [name] is taking Trulicity and tolerating it well. She denies side effects.  3. Vitamin D deficiency [name] is taking OTC Vit D 1K IU daily.  4. At risk for osteoporosis [name] is at higher risk of osteopenia and osteoporosis due to Vitamin D deficiency.   Assessment/Plan:   1. Impaired fasting glucose Refill Trulicity 3 mg, as prescribed below. - Dulaglutide (TRULICITY) 3 MG/0.5ML SOPN; Inject 3 mg as directed once a week.  Dispense: 2 mL; Refill: 0  2. Polyphagia Intensive lifestyle modifications are the first line treatment for this issue. We discussed several lifestyle modifications today and she will continue to work on diet, exercise and weight loss efforts. Orders and follow up as documented in patient record.  Counseling . Polyphagia is excessive hunger. . Causes can include: low blood sugars, hypERthyroidism, PMS, lack of sleep, stress, insulin resistance, diabetes, certain medications, and diets that are deficient in protein and fiber.   - Dulaglutide (TRULICITY) 3 MG/0.5ML SOPN; Inject 3 mg as directed once a week.  Dispense: 2 mL; Refill: 0  3. Vitamin D deficiency Low Vitamin D level contributes to fatigue and are associated with obesity, breast, and colon cancer. She  agrees to continue to take OTC Vitamin D @1 ,000 IU daily and will follow-up for routine testing of Vitamin D, at least 2-3 times per year to avoid over-replacement.  4. At risk for osteoporosis [name] was given approximately 15 minutes of osteoporosis prevention counseling today. [name] is at risk for osteopenia and osteoporosis due to her Vitamin D deficiency. She was encouraged to take her Vitamin D and follow her higher calcium diet and increase strengthening exercise to help strengthen her bones and decrease her risk of osteopenia and osteoporosis.  Repetitive spaced learning was employed today to elicit superior memory formation and behavioral change.  5. Obesity with current BMI of 49  [name] is currently in the action stage of change. As such, her goal is to continue with weight loss efforts. She has agreed to the Category 2 Plan.   Meal plan  Intentional eating  Exercise goals: As is  Behavioral modification strategies: increasing lean protein intake, decreasing simple carbohydrates, increasing vegetables, increasing water intake, decreasing eating out, no skipping meals, meal planning and cooking strategies, keeping healthy foods in the home and planning for success.  [name] has agreed to follow-up with our clinic in 2 weeks. She was informed of the importance of frequent follow-up visits to maximize her success with intensive lifestyle modifications for her multiple health conditions.   Objective:   Blood pressure 126/84, pulse 76, temperature 98.3 F (36.8 C), height 5\' 4"  (1.626 m), weight 288 lb (130.6 kg), SpO2 96 %. Body mass index is 49.44 kg/m.  General: Cooperative, alert, well developed, in no acute distress. HEENT: Conjunctivae and lids  unremarkable. Cardiovascular: Regular rhythm.  Lungs: Normal work of breathing. Neurologic: No focal deficits.   Lab Results  Component Value Date   CREATININE 0.70 04/16/2016   BUN 16 04/16/2016   NA 140 04/16/2016    K 4.1 04/16/2016   CL 104 04/16/2016   CO2 23 01/29/2014   Lab Results  Component Value Date   ALT 11 01/29/2014   AST 13 01/29/2014   ALKPHOS 46 01/29/2014   BILITOT 0.2 (L) 01/29/2014   No results found for: HGBA1C Lab Results  Component Value Date   INSULIN 22.2 03/26/2020   Lab Results  Component Value Date   TSH 2.060 03/26/2020   No results found for: CHOL, HDL, LDLCALC, LDLDIRECT, TRIG, CHOLHDL Lab Results  Component Value Date   WBC 8.0 12/15/2015   HGB 12.2 04/16/2016   HCT 36.0 04/16/2016   MCV 90.2 12/15/2015   PLT 316 12/15/2015   No results found for: IRON, TIBC, FERRITIN   Attestation Statements:   Reviewed by clinician on day of visit: allergies, medications, problem list, medical history, surgical history, family history, social history, and previous encounter notes.  Edmund Hilda, CMA, am acting as Energy manager for [employer], DO.  I have reviewed the above documentation for accuracy and completeness, and I agree with the above. Corinna Capra, DO

## 2020-07-22 ENCOUNTER — Other Ambulatory Visit (INDEPENDENT_AMBULATORY_CARE_PROVIDER_SITE_OTHER): Payer: Self-pay | Admitting: Bariatrics

## 2020-07-22 ENCOUNTER — Other Ambulatory Visit: Payer: Self-pay

## 2020-07-22 ENCOUNTER — Encounter (INDEPENDENT_AMBULATORY_CARE_PROVIDER_SITE_OTHER): Payer: Self-pay | Admitting: Bariatrics

## 2020-07-22 ENCOUNTER — Ambulatory Visit (INDEPENDENT_AMBULATORY_CARE_PROVIDER_SITE_OTHER): Payer: 59 | Admitting: Bariatrics

## 2020-07-22 VITALS — BP 114/74 | HR 77 | Temp 97.7°F | Ht 64.0 in | Wt 283.0 lb

## 2020-07-22 DIAGNOSIS — F5089 Other specified eating disorder: Secondary | ICD-10-CM | POA: Diagnosis not present

## 2020-07-22 DIAGNOSIS — Z9189 Other specified personal risk factors, not elsewhere classified: Secondary | ICD-10-CM

## 2020-07-22 DIAGNOSIS — E8881 Metabolic syndrome: Secondary | ICD-10-CM

## 2020-07-22 DIAGNOSIS — R632 Polyphagia: Secondary | ICD-10-CM | POA: Diagnosis not present

## 2020-07-22 DIAGNOSIS — Z6841 Body Mass Index (BMI) 40.0 and over, adult: Secondary | ICD-10-CM

## 2020-07-22 MED ORDER — BUPROPION HCL ER (SR) 150 MG PO TB12: 150.0000 mg | ORAL_TABLET | Freq: Every day | ORAL | 0 refills | Status: DC

## 2020-07-22 MED ORDER — TRULICITY 0.75 MG/0.5ML ~~LOC~~ SOAJ: 0.7500 mg | SUBCUTANEOUS | 0 refills | Status: DC

## 2020-07-22 NOTE — Telephone Encounter (Signed)
Dr.Brown 

## 2020-07-29 NOTE — Progress Notes (Signed)
Chief Complaint:   OBESITY Rheanna is here to discuss her progress with her obesity treatment plan along with follow-up of her obesity related diagnoses. [name] is on the Category 2 Plan and states she is following her eating plan approximately 95-100% of the time. [name] states she is walking and swimming 30-60 minutes 2 times per week.  Today's visit was #: 8 Starting weight: 300 lbs Starting date: 03/26/2020 Today's weight: 283 lbs Today's date: 07/22/2020 Total lbs lost to date: 17 Total lbs lost since last in-office visit: 5  Interim History: [name] is down 5 lbs. She continues to struggle with water.  Subjective:   1. Other disorder of eating [name] is taking her medications as directed.  2. Insulin resistance Zalma is taking Trulicity.  3. Polyphagia [name] is taking Trulicity.   4. At risk for constipation [name] is at increased risk for constipation due to inadequate water intake, changes in diet, and/or use of medications such as GLP1 agonists. [name] denies hard, infrequent stools currently.   Assessment/Plan:   1. Other disorder of eating Behavior modification techniques were discussed today to help [name] deal with her emotional/non-hunger eating behaviors. We will refill Wellbutrin SR for 1 month. Orders and follow up as documented in patient record.   - buPROPion (WELLBUTRIN SR) 150 MG 12 hr tablet; Take 1 tablet (150 mg total) by mouth daily.  Dispense: 30 tablet; Refill: 0  2. Insulin resistance [name] will continue her medications, and will continue to work on weight loss, exercise, and decreasing simple carbohydrates to help decrease the risk of diabetes. [name] agreed to follow-up with [name] as directed to closely monitor her progress.  3. Polyphagia Intensive lifestyle modifications are the first line treatment for this issue. We discussed several lifestyle modifications today. [name] agreed to increase Trulicity to 0.75 mg SubQ  weekly with no refills. She will continue to work on diet, exercise and weight loss efforts. Orders and follow up as documented in patient record.  Counseling Polyphagia is excessive hunger. Causes can include: low blood sugars, hypERthyroidism, PMS, lack of sleep, stress, insulin resistance, diabetes, certain medications, and diets that are deficient in protein and fiber.   - Dulaglutide (TRULICITY) 0.75 MG/0.5ML SOPN; Inject 0.75 mg into the skin once a week.  Dispense: 2 mL; Refill: 0  4. At risk for constipation [name] was given approximately 15 minutes of counseling today regarding prevention of constipation. She was encouraged to increase water and fiber intake.   5. Obesity with current BMI of 48.7 [name] is currently in the action stage of change. As such, her goal is to continue with weight loss efforts. She has agreed to the Category 2 Plan.   Intentional eating was discussed.  Exercise goals: As is.  Behavioral modification strategies: increasing lean protein intake, decreasing simple carbohydrates, increasing vegetables, increasing water intake, decreasing eating out, no skipping meals, meal planning and cooking strategies, keeping healthy foods in the home, and planning for success.  [name] has agreed to follow-up with our clinic in 2 weeks. She was informed of the importance of frequent follow-up visits to maximize her success with intensive lifestyle modifications for her multiple health conditions.   Objective:   Blood pressure 114/74, pulse 77, temperature 97.7 F (36.5 C), height 5\' 4"  (1.626 m), weight 283 lb (128.4 kg), SpO2 97 %. Body mass index is 48.58 kg/m.  General: Cooperative, alert, well developed, in no acute distress. HEENT: Conjunctivae and lids unremarkable. Cardiovascular: Regular rhythm.  Lungs: Normal work  of breathing. Neurologic: No focal deficits.   Lab Results  Component Value Date   CREATININE 0.70 04/16/2016   BUN 16 04/16/2016    NA 140 04/16/2016   K 4.1 04/16/2016   CL 104 04/16/2016   CO2 23 01/29/2014   Lab Results  Component Value Date   ALT 11 01/29/2014   AST 13 01/29/2014   ALKPHOS 46 01/29/2014   BILITOT 0.2 (L) 01/29/2014   No results found for: HGBA1C Lab Results  Component Value Date   INSULIN 22.2 03/26/2020   Lab Results  Component Value Date   TSH 2.060 03/26/2020   No results found for: CHOL, HDL, LDLCALC, LDLDIRECT, TRIG, CHOLHDL Lab Results  Component Value Date   WBC 8.0 12/15/2015   HGB 12.2 04/16/2016   HCT 36.0 04/16/2016   MCV 90.2 12/15/2015   PLT 316 12/15/2015   No results found for: IRON, TIBC, FERRITIN  Attestation Statements:   Reviewed by clinician on day of visit: allergies, medications, problem list, medical history, surgical history, family history, social history, and previous encounter notes.   Trude Mcburney, am acting as Energy manager for [employer], DO.  I have reviewed the above documentation for accuracy and completeness, and I agree with the above. Corinna Capra, DO

## 2020-08-06 ENCOUNTER — Ambulatory Visit
Admission: RE | Admit: 2020-08-06 | Discharge: 2020-08-06 | Disposition: A | Discharge status: Home / Self Care | Payer: 59 | Source: Ambulatory Visit

## 2020-08-06 ENCOUNTER — Other Ambulatory Visit: Payer: Self-pay

## 2020-08-06 DIAGNOSIS — Z1231 Encounter for screening mammogram for malignant neoplasm of breast: Secondary | ICD-10-CM

## 2020-08-07 ENCOUNTER — Encounter (INDEPENDENT_AMBULATORY_CARE_PROVIDER_SITE_OTHER): Payer: Self-pay | Admitting: Bariatrics

## 2020-08-07 ENCOUNTER — Ambulatory Visit (INDEPENDENT_AMBULATORY_CARE_PROVIDER_SITE_OTHER): Payer: 59 | Admitting: Bariatrics

## 2020-08-07 VITALS — BP 127/79 | HR 89 | Temp 98.7°F | Ht 64.0 in | Wt 284.0 lb

## 2020-08-07 DIAGNOSIS — E8881 Metabolic syndrome: Secondary | ICD-10-CM | POA: Diagnosis not present

## 2020-08-07 DIAGNOSIS — K5909 Other constipation: Secondary | ICD-10-CM | POA: Diagnosis not present

## 2020-08-07 DIAGNOSIS — R632 Polyphagia: Secondary | ICD-10-CM | POA: Diagnosis not present

## 2020-08-07 DIAGNOSIS — Z6841 Body Mass Index (BMI) 40.0 and over, adult: Secondary | ICD-10-CM

## 2020-08-07 DIAGNOSIS — Z9189 Other specified personal risk factors, not elsewhere classified: Secondary | ICD-10-CM

## 2020-08-07 MED ORDER — TRULICITY 1.5 MG/0.5ML ~~LOC~~ SOAJ: 1.5000 mg | SUBCUTANEOUS | 0 refills | Status: DC

## 2020-08-13 NOTE — Progress Notes (Signed)
Chief Complaint:   OBESITY [name] is here to discuss her progress with her obesity treatment plan along with follow-up of her obesity related diagnoses. [name] is on the Category 2 Plan and states she is following her eating plan approximately 100% of the time. [name] states she is swimming and walking for 1 mile for 3 hours 3-4 times per week.   Today's visit was #: 9 Starting weight: 300 lbs Starting date: 03/26/2020 Today's weight: 284 lbs Today's date: 08/07/2020 Total lbs lost to date: 16 Total lbs lost since last in-office visit: 0  Interim History: [name] is up 1 lb since her last visit. She states that she is following her plan 100%. She is up 4 lbs in water weight per our bioimpedance scale.  Subjective:   1. Insulin resistance [name] is taking Trulicity 0.75 mg weekly.  2. [name] notes decrease in carbohydrates.  3. Other constipation [name] is taking miralax.  4. At risk for nausea [name] is at risk for nausea due to Trulicity.  Assessment/Plan:   1. Insulin resistance Jandi will continue to work on weight loss, exercise, and decreasing simple carbohydrates to help decrease the risk of diabetes. [name] agreed to increase Trulicity to 1.5 mg q weekly with no refills. [name] agreed to follow-up with [name] as directed to closely monitor her progress.  - Dulaglutide (TRULICITY) 1.5 MG/0.5ML SOPN; Inject 1.5 mg into the skin once a week.  Dispense: 2 mL; Refill: 0  2. Polyphagia Intensive lifestyle modifications are the first line treatment for this issue. We discussed several lifestyle modifications today. [name] will continue to work on diet, exercise and weight loss efforts. She will continue to decrease carbohydrates, and increase healthy fats and protein. Orders and follow up as documented in patient record.  Counseling Polyphagia is excessive hunger. Causes can include: low blood sugars, hypERthyroidism, PMS, lack of sleep,  stress, insulin resistance, diabetes, certain medications, and diets that are deficient in protein and fiber.   3. Other constipation [name] will continue miralax daily. She was informed that a decrease in bowel movement frequency is normal while losing weight, but stools should not be hard or painful. Orders and follow up as documented in patient record.   Counseling Getting to Good Bowel Health: Your goal is to have one soft bowel movement each day. Drink at least 8 glasses of water each day. Eat plenty of fiber (goal is over 25 grams each day). It is best to get most of your fiber from dietary sources which includes leafy green vegetables, fresh fruit, and whole grains. You may need to add fiber with the help of OTC fiber supplements. These include Metamucil, Citrucel, and Flaxseed. If you are still having trouble, try adding Miralax or Magnesium Citrate. If all of these changes do not work, Dietitian.  4. At risk for nausea [name] was given approximately 15 minutes of nausea prevention counseling today. [name] is at risk for nausea due to her new or current medication. She was encouraged to titrate her medication slowly, make sure to stay hydrated, eat smaller portions throughout the day, and avoid high fat meals.   5. Obesity with current BMI of 48.8 [name] is currently in the action stage of change. As such, her goal is to continue with weight loss efforts. She has agreed to switch to the Category 1 Plan.   Exercise goals: As is.  Behavioral modification strategies: increasing lean protein intake, decreasing simple carbohydrates, increasing vegetables, increasing water  intake, decreasing eating out, no skipping meals, meal planning and cooking strategies, keeping healthy foods in the home, and planning for success.  [name] has agreed to follow-up with our clinic in 2 to 3 weeks. She was informed of the importance of frequent follow-up visits to maximize her  success with intensive lifestyle modifications for her multiple health conditions.   Objective:   Blood pressure 127/79, pulse 89, temperature 98.7 F (37.1 C), height 5\' 4"  (1.626 m), weight 284 lb (128.8 kg), last menstrual period 07/23/2020, SpO2 95 %. Body mass index is 48.75 kg/m.  General: Cooperative, alert, well developed, in no acute distress. HEENT: Conjunctivae and lids unremarkable. Cardiovascular: Regular rhythm.  Lungs: Normal work of breathing. Neurologic: No focal deficits.   Lab Results  Component Value Date   CREATININE 0.70 04/16/2016   BUN 16 04/16/2016   NA 140 04/16/2016   K 4.1 04/16/2016   CL 104 04/16/2016   CO2 23 01/29/2014   Lab Results  Component Value Date   ALT 11 01/29/2014   AST 13 01/29/2014   ALKPHOS 46 01/29/2014   BILITOT 0.2 (L) 01/29/2014   No results found for: HGBA1C Lab Results  Component Value Date   INSULIN 22.2 03/26/2020   Lab Results  Component Value Date   TSH 2.060 03/26/2020   No results found for: CHOL, HDL, LDLCALC, LDLDIRECT, TRIG, CHOLHDL No results found for: 05/24/2020 Lab Results  Component Value Date   WBC 8.0 12/15/2015   HGB 12.2 04/16/2016   HCT 36.0 04/16/2016   MCV 90.2 12/15/2015   PLT 316 12/15/2015   No results found for: IRON, TIBC, FERRITIN  Attestation Statements:   Reviewed by clinician on day of visit: allergies, medications, problem list, medical history, surgical history, family history, social history, and previous encounter notes.   12/17/2015, am acting as Trude Mcburney for Energy manager, DO.  I have reviewed the above documentation for accuracy and completeness, and I agree with the above. [employer], DO

## 2020-08-14 ENCOUNTER — Encounter (INDEPENDENT_AMBULATORY_CARE_PROVIDER_SITE_OTHER): Payer: Self-pay | Admitting: Bariatrics

## 2020-08-21 ENCOUNTER — Other Ambulatory Visit: Payer: Self-pay

## 2020-08-21 ENCOUNTER — Encounter (INDEPENDENT_AMBULATORY_CARE_PROVIDER_SITE_OTHER): Payer: Self-pay | Admitting: Bariatrics

## 2020-08-21 ENCOUNTER — Ambulatory Visit (INDEPENDENT_AMBULATORY_CARE_PROVIDER_SITE_OTHER): Payer: 59 | Admitting: Bariatrics

## 2020-08-21 VITALS — BP 123/80 | HR 80 | Temp 98.3°F | Ht 64.0 in | Wt 283.0 lb

## 2020-08-21 DIAGNOSIS — E8881 Metabolic syndrome: Secondary | ICD-10-CM | POA: Diagnosis not present

## 2020-08-21 DIAGNOSIS — R632 Polyphagia: Secondary | ICD-10-CM

## 2020-08-21 DIAGNOSIS — Z6841 Body Mass Index (BMI) 40.0 and over, adult: Secondary | ICD-10-CM

## 2020-08-26 NOTE — Progress Notes (Signed)
Chief Complaint:   OBESITY [name] is here to discuss her progress with her obesity treatment plan along with follow-up of her obesity related diagnoses. [name] is on the Category 1 Plan and states she is following her eating plan approximately 50% of the time. [name] states she is doing 0 minutes 0 times per week.  Today's visit was #: 10 Starting weight: 300 lbs Starting date: 04/05/2020 Today's weight: 283 lbs Today's date: 08/21/2020 Total lbs lost to date: 17 lbs Total lbs lost since last in-office visit: 1 lb.  Interim History: [name] is down 1 lb since her last visit.  She is doing better with her protein.  Subjective:   1. Polyphagia Retina is taking Wellbutrin for stress and boredom eating.  2. Insulin resistance [name] is taking Trulicity.  Assessment/Plan:   1. Polyphagia [name] will increase protein, H2O, and raw vegetables. Intensive lifestyle modifications are the first line treatment for this issue. We discussed several lifestyle modifications today and she will continue to work on diet, exercise and weight loss efforts. Orders and follow up as documented in patient record.  Counseling Polyphagia is excessive hunger. Causes can include: low blood sugars, hypERthyroidism, PMS, lack of sleep, stress, insulin resistance, diabetes, certain medications, and diets that are deficient in protein and fiber.    2. Insulin resistance [name] will continue Trulicity, and will continue to work on weight loss, exercise, and decreasing simple carbohydrates to help decrease the risk of diabetes. [name] agreed to follow-up with [name] as directed to closely monitor her progress.   3. Obesity with current BMI of 48.7 [name] is currently in the action stage of change. As such, her goal is to continue with weight loss efforts. She has agreed to the Category 1 Plan.   [name] will adhere more closely to the plan.  She is mindful eating and will increase her H2O  intake.  Exercise goals: No exercise has been prescribed at this time.  Behavioral modification strategies: increasing lean protein intake, decreasing simple carbohydrates, increasing vegetables, increasing water intake, decreasing eating out, no skipping meals, meal planning and cooking strategies, keeping healthy foods in the home, and planning for success.  [name] has agreed to follow-up with our clinic in 2 weeks. She was informed of the importance of frequent follow-up visits to maximize her success with intensive lifestyle modifications for her multiple health conditions.   Objective:   Blood pressure 123/80, pulse 80, temperature 98.3 F (36.8 C), height 5\' 4"  (1.626 m), weight 283 lb (128.4 kg), last menstrual period 07/23/2020, SpO2 96 %. Body mass index is 48.58 kg/m.  General: Cooperative, alert, well developed, in no acute distress. HEENT: Conjunctivae and lids unremarkable. Cardiovascular: Regular rhythm.  Lungs: Normal work of breathing. Neurologic: No focal deficits.   Lab Results  Component Value Date   CREATININE 0.70 04/16/2016   BUN 16 04/16/2016   NA 140 04/16/2016   K 4.1 04/16/2016   CL 104 04/16/2016   CO2 23 01/29/2014   Lab Results  Component Value Date   ALT 11 01/29/2014   AST 13 01/29/2014   ALKPHOS 46 01/29/2014   BILITOT 0.2 (L) 01/29/2014   No results found for: HGBA1C Lab Results  Component Value Date   INSULIN 22.2 03/26/2020   Lab Results  Component Value Date   TSH 2.060 03/26/2020   No results found for: CHOL, HDL, LDLCALC, LDLDIRECT, TRIG, CHOLHDL No results found for: 05/24/2020 Lab Results  Component Value Date   WBC 8.0 12/15/2015  HGB 12.2 04/16/2016   HCT 36.0 04/16/2016   MCV 90.2 12/15/2015   PLT 316 12/15/2015   No results found for: IRON, TIBC, FERRITIN  Attestation Statements:   Reviewed by clinician on day of visit: allergies, medications, problem list, medical history, surgical history, family history, social  history, and previous encounter notes.  Time spent on visit including pre-visit chart review and post-visit care and charting was 20 minutes.   I, Jackson Latino, RMA, am acting as Energy manager for [employer], DO.   I have reviewed the above documentation for accuracy and completeness, and I agree with the above. Corinna Capra, DO

## 2020-08-27 ENCOUNTER — Encounter (INDEPENDENT_AMBULATORY_CARE_PROVIDER_SITE_OTHER): Payer: Self-pay | Admitting: Bariatrics

## 2020-09-08 ENCOUNTER — Ambulatory Visit (INDEPENDENT_AMBULATORY_CARE_PROVIDER_SITE_OTHER): Payer: 59 | Admitting: Bariatrics

## 2020-09-08 ENCOUNTER — Other Ambulatory Visit: Payer: Self-pay

## 2020-09-08 VITALS — BP 119/74 | HR 71 | Temp 98.0°F | Ht 64.0 in | Wt 281.0 lb

## 2020-09-08 DIAGNOSIS — R632 Polyphagia: Secondary | ICD-10-CM | POA: Diagnosis not present

## 2020-09-08 DIAGNOSIS — E8881 Metabolic syndrome: Secondary | ICD-10-CM | POA: Diagnosis not present

## 2020-09-08 DIAGNOSIS — Z6841 Body Mass Index (BMI) 40.0 and over, adult: Secondary | ICD-10-CM

## 2020-09-08 DIAGNOSIS — F5089 Other specified eating disorder: Secondary | ICD-10-CM | POA: Diagnosis not present

## 2020-09-08 DIAGNOSIS — Z9189 Other specified personal risk factors, not elsewhere classified: Secondary | ICD-10-CM | POA: Diagnosis not present

## 2020-09-08 DIAGNOSIS — K5909 Other constipation: Secondary | ICD-10-CM

## 2020-09-08 MED ORDER — TRULICITY 3 MG/0.5ML ~~LOC~~ SOAJ: 3.0000 mg | SUBCUTANEOUS | 0 refills | Status: DC

## 2020-09-09 ENCOUNTER — Encounter (INDEPENDENT_AMBULATORY_CARE_PROVIDER_SITE_OTHER): Payer: Self-pay

## 2020-09-11 ENCOUNTER — Other Ambulatory Visit (INDEPENDENT_AMBULATORY_CARE_PROVIDER_SITE_OTHER): Payer: Self-pay | Admitting: Bariatrics

## 2020-09-11 DIAGNOSIS — E8881 Metabolic syndrome: Secondary | ICD-10-CM

## 2020-09-11 NOTE — Telephone Encounter (Signed)
Pt last seen by Dr. Brown.  

## 2020-09-12 ENCOUNTER — Encounter (INDEPENDENT_AMBULATORY_CARE_PROVIDER_SITE_OTHER): Payer: Self-pay | Admitting: Bariatrics

## 2020-09-12 NOTE — Progress Notes (Signed)
Chief Complaint:   OBESITY [name] is here to discuss her progress with her obesity treatment plan along with follow-up of her obesity related diagnoses. [name] is on the Category 2 Plan and states she is following her eating plan approximately 85-90% of the time. [name] states she is not currently exercising.    Today's visit was #: 11 Starting weight: 300 lbs Starting date: 03/26/2020 Today's weight: 281 lbs Today's date: 09/08/2020 Total lbs lost to date: 11 lbs Total lbs lost since last in-office visit: 2 lbs  Interim History: [name] is down an additional 2 lbs and doing well.  She is doing better with the pan and is working on her walking.  Subjective:   1. Insulin resistance [name] has a diagnosis of insulin resistance based on her elevated fasting insulin level >5. She continues to work on diet and exercise to decrease her risk of diabetes. [name] is taking Trulicity 1.5 mg subq weekly, currently helping the first few days.  Lab Results  Component Value Date   INSULIN 22.2 03/26/2020   No results found for: HGBA1C  2. Polyphagia [name] endorses excessive hunger. She is currently taking Trulicity.  3. Other disorder of eating [name] is taking Wellbutrin.  4. At risk for constipation [name] is at increased risk for constipation due to inadequate water intake, changes in diet, and/or use of medications such as GLP1 agonists. [name] denies hard, infrequent stools currently.  Risk due to increase of Trulicity.  Assessment/Plan:   1. Insulin resistance Nona will continue to work on weight loss, exercise, and decreasing simple carbohydrates to help decrease the risk of diabetes. [name] agreed to follow-up with [name] as directed to closely monitor her progress. [name] will continue taking Trulicity and increase to 3.0 mg subq weekly.  2. [name] will continue taking Trulicity and increase to 3.0 mg subq weekly.  3. Other disorder of  eating [name] will continue taking Wellbutrin.  4. At risk for constipaition [name] was given approximately 15 minutes of counseling today regarding prevention of constipation. She was encouraged to increase water and fiber intake.    5. Obesity with current BMI of 48.4 [name] is currently in the action stage of change. As such, her goal is to continue with weight loss efforts. She has agreed to the Category 2 Plan.   Meal planning Intentional eating Increase water intake  Exercise goals: As is.  Behavioral modification strategies: increasing lean protein intake, decreasing simple carbohydrates, increasing vegetables, increasing water intake, decreasing eating out, no skipping meals, meal planning and cooking strategies, keeping healthy foods in the home, and planning for success.  [name] has agreed to follow-up with our clinic in 3 weeks. She was informed of the importance of frequent follow-up visits to maximize her success with intensive lifestyle modifications for her multiple health conditions.   Objective:   Blood pressure 119/74, pulse 71, temperature 98 F (36.7 C), height 5\' 4"  (1.626 m), weight 281 lb (127.5 kg), SpO2 98 %. Body mass index is 48.23 kg/m.  General: Cooperative, alert, well developed, in no acute distress. HEENT: Conjunctivae and lids unremarkable. Cardiovascular: Regular rhythm.  Lungs: Normal work of breathing. Neurologic: No focal deficits.   Lab Results  Component Value Date   CREATININE 0.70 04/16/2016   BUN 16 04/16/2016   NA 140 04/16/2016   K 4.1 04/16/2016   CL 104 04/16/2016   CO2 23 01/29/2014   Lab Results  Component Value Date   ALT 11 01/29/2014   AST  13 01/29/2014   ALKPHOS 46 01/29/2014   BILITOT 0.2 (L) 01/29/2014   No results found for: HGBA1C Lab Results  Component Value Date   INSULIN 22.2 03/26/2020   Lab Results  Component Value Date   TSH 2.060 03/26/2020   No results found for: CHOL, HDL, LDLCALC,  LDLDIRECT, TRIG, CHOLHDL No results found for: GU44IH Lab Results  Component Value Date   WBC 8.0 12/15/2015   HGB 12.2 04/16/2016   HCT 36.0 04/16/2016   MCV 90.2 12/15/2015   PLT 316 12/15/2015   No results found for: IRON, TIBC, FERRITIN  Attestation Statements:   Reviewed by clinician on day of visit: allergies, medications, problem list, medical history, surgical history, family history, social history, and previous encounter notes.  IPaulla Fore, CMA, am acting as transcriptionist for Dr. Corinna Capra, DO.  I have reviewed the above documentation for accuracy and completeness, and I agree with the above. Corinna Capra, DO

## 2020-09-20 ENCOUNTER — Other Ambulatory Visit (INDEPENDENT_AMBULATORY_CARE_PROVIDER_SITE_OTHER): Payer: Self-pay | Admitting: Bariatrics

## 2020-09-20 DIAGNOSIS — R632 Polyphagia: Secondary | ICD-10-CM

## 2020-09-22 NOTE — Telephone Encounter (Signed)
Dr.Brown 

## 2020-09-25 ENCOUNTER — Encounter (INDEPENDENT_AMBULATORY_CARE_PROVIDER_SITE_OTHER): Payer: Self-pay | Admitting: Bariatrics

## 2020-09-29 NOTE — Telephone Encounter (Signed)
Please see patient message.

## 2020-09-29 NOTE — Telephone Encounter (Signed)
Last OV with Dr Brown 

## 2020-10-06 ENCOUNTER — Ambulatory Visit (INDEPENDENT_AMBULATORY_CARE_PROVIDER_SITE_OTHER): Payer: 59 | Admitting: Bariatrics

## 2020-10-06 ENCOUNTER — Encounter (INDEPENDENT_AMBULATORY_CARE_PROVIDER_SITE_OTHER): Payer: Self-pay | Admitting: Bariatrics

## 2020-10-06 ENCOUNTER — Other Ambulatory Visit: Payer: Self-pay

## 2020-10-06 DIAGNOSIS — Z9189 Other specified personal risk factors, not elsewhere classified: Secondary | ICD-10-CM | POA: Diagnosis not present

## 2020-10-06 DIAGNOSIS — E8881 Metabolic syndrome: Secondary | ICD-10-CM | POA: Diagnosis not present

## 2020-10-06 DIAGNOSIS — Z6841 Body Mass Index (BMI) 40.0 and over, adult: Secondary | ICD-10-CM

## 2020-10-06 DIAGNOSIS — F5089 Other specified eating disorder: Secondary | ICD-10-CM

## 2020-10-06 MED ORDER — METFORMIN HCL 500 MG PO TABS: 500.0000 mg | ORAL_TABLET | Freq: Every day | ORAL | 0 refills | Status: DC

## 2020-10-06 MED ORDER — BUPROPION HCL ER (SR) 150 MG PO TB12: 150.0000 mg | ORAL_TABLET | Freq: Every day | ORAL | 0 refills | Status: DC

## 2020-10-06 NOTE — Progress Notes (Signed)
Chief Complaint:   OBESITY [name] is here to discuss her progress with her obesity treatment plan along with follow-up of her obesity related diagnoses. [name] is on the Category 2 Plan and states she is following her eating plan approximately 50% of the time. [name] states she is walking for 30 minutes 4 times per week.  Today's visit was #: 12 Starting weight: 300 lbs Starting date: 03/26/2020 Today's weight: 285 lbs Today's date: 10/06/2020 Total lbs lost to date: 15 lbs Total lbs lost since last in-office visit: 0  Interim History: [name] is up 4 lbs since her last visit, as she was on vacation.  Subjective:   1. Other disorder of eating [name] is taking medications as directed.  2. Insulin resistance [name] is currently not on medications.  3. At risk of diabetes mellitus [name] is at risk of diabetes mellitus due to Insulin resistance and obesity.  Assessment/Plan:   1. Other disorder of eating Behavior modification techniques were discussed today to help [name] deal with her emotional/non-hunger eating behaviors.  We will refill Wellbutrin SR 150 mg once daily for 1 month with no refills. Orders and follow up as documented in patient record.    - buPROPion (WELLBUTRIN SR) 150 MG 12 hr tablet; Take 1 tablet (150 mg total) by mouth daily.  Dispense: 30 tablet; Refill: 0  2. Insulin resistance [name] will adhere closely to the plan. She will increase healthy fats and protein. [name] will stop Trulicity and will resume Metformin for 1 month with no refills. She will continue to work on weight loss, exercise, and decreasing simple carbohydrates to help decrease the risk of diabetes. [name] agreed to follow-up with [name] as directed to closely monitor her progress.  - metFORMIN (GLUCOPHAGE) 500 MG tablet; Take 1 tablet (500 mg total) by mouth daily with lunch.  Dispense: 30 tablet; Refill: 0  3. At risk of diabetes mellitus [name] was given  approximately 15 minutes of diabetes education and counseling today. We discussed intensive lifestyle modifications today with an emphasis on weight loss as well as increasing exercise and decreasing simple carbohydrates in her diet. We also reviewed medication options with an emphasis on risk versus benefit of those discussed.   Repetitive spaced learning was employed today to elicit superior memory formation and behavioral change.   4. Obesity with current BMI of 48.9 [name] is currently in the action stage of change. As such, her goal is to continue with weight loss efforts. She has agreed to the Category 2 Plan.   [name] will continue meal planning. She will get back  to adhering closely to the plan (80-85%).  Exercise goals:  As is.  Behavioral modification strategies: increasing lean protein intake, decreasing simple carbohydrates, increasing vegetables, increasing water intake, decreasing eating out, no skipping meals, meal planning and cooking strategies, keeping healthy foods in the home, and planning for success.  [name] has agreed to follow-up with our clinic in 3 weeks. She was informed of the importance of frequent follow-up visits to maximize her success with intensive lifestyle modifications for her multiple health conditions.   Objective:   Blood pressure 119/81, pulse 67, temperature 97.8 F (36.6 C), height 5\' 4"  (1.626 m), weight 285 lb (129.3 kg), SpO2 98 %. Body mass index is 48.92 kg/m.  General: Cooperative, alert, well developed, in no acute distress. HEENT: Conjunctivae and lids unremarkable. Cardiovascular: Regular rhythm.  Lungs: Normal work of breathing. Neurologic: No focal deficits.   Lab Results  Component Value  Date   CREATININE 0.70 04/16/2016   BUN 16 04/16/2016   NA 140 04/16/2016   K 4.1 04/16/2016   CL 104 04/16/2016   CO2 23 01/29/2014   Lab Results  Component Value Date   ALT 11 01/29/2014   AST 13 01/29/2014   ALKPHOS 46  01/29/2014   BILITOT 0.2 (L) 01/29/2014   No results found for: HGBA1C Lab Results  Component Value Date   INSULIN 22.2 03/26/2020   Lab Results  Component Value Date   TSH 2.060 03/26/2020   No results found for: CHOL, HDL, LDLCALC, LDLDIRECT, TRIG, CHOLHDL No results found for: BE01EO Lab Results  Component Value Date   WBC 8.0 12/15/2015   HGB 12.2 04/16/2016   HCT 36.0 04/16/2016   MCV 90.2 12/15/2015   PLT 316 12/15/2015   No results found for: IRON, TIBC, FERRITIN  Attestation Statements:   Reviewed by clinician on day of visit: allergies, medications, problem list, medical history, surgical history, family history, social history, and previous encounter notes.  I, Jackson Latino, RMA, am acting as Energy manager for [employer], DO.   I have reviewed the above documentation for accuracy and completeness, and I agree with the above. Corinna Capra, DO

## 2020-10-27 ENCOUNTER — Telehealth (INDEPENDENT_AMBULATORY_CARE_PROVIDER_SITE_OTHER): Payer: 59 | Admitting: Bariatrics

## 2020-10-27 ENCOUNTER — Encounter (INDEPENDENT_AMBULATORY_CARE_PROVIDER_SITE_OTHER): Payer: Self-pay | Admitting: Bariatrics

## 2020-10-27 DIAGNOSIS — F5089 Other specified eating disorder: Secondary | ICD-10-CM

## 2020-10-27 DIAGNOSIS — E559 Vitamin D deficiency, unspecified: Secondary | ICD-10-CM | POA: Diagnosis not present

## 2020-10-27 DIAGNOSIS — Z6841 Body Mass Index (BMI) 40.0 and over, adult: Secondary | ICD-10-CM

## 2020-10-27 DIAGNOSIS — R632 Polyphagia: Secondary | ICD-10-CM

## 2020-10-27 MED ORDER — BUPROPION HCL ER (SR) 150 MG PO TB12: 150.0000 mg | ORAL_TABLET | Freq: Every day | ORAL | 0 refills | Status: DC

## 2020-10-28 ENCOUNTER — Encounter (INDEPENDENT_AMBULATORY_CARE_PROVIDER_SITE_OTHER): Payer: Self-pay | Admitting: Bariatrics

## 2020-10-28 NOTE — Progress Notes (Signed)
TeleHealth Visit:  Due to the COVID-19 pandemic, this visit was completed with telemedicine (audio/video) technology to reduce patient and provider exposure as well as to preserve personal protective equipment.   [name] has verbally consented to this TeleHealth visit. The patient is located at home, the provider is located at the [facility] office. The participants in this visit include the listed provider and patient. The visit was conducted today via video.  Chief Complaint: OBESITY [name] is here to discuss her progress with her obesity treatment plan along with follow-up of her obesity related diagnoses. [name] is on the Category 2 Plan and states she is following her eating plan approximately 90% of the time. [name] states she is doing 0 minutes 0 times per week.  Today's visit was #: 13 Starting weight: 300 lbs Starting date: 03/26/2020  Interim History: [name] think that she is down 5 lbs. Her clothes are more loose. She is doing better with her water intake.  Subjective:   1. Polyphagia [name] is currently taking Metformin and Trulicity.  2. Vitamin D deficiency [name] is currently taking Vitamin D OTC.  3. Other disorder of eating [name] is currently taking Wellbutrin. Her cravings are less except for increased with meat.   Assessment/Plan:   1. Polyphagia Intensive lifestyle modifications are the first line treatment for this issue. We discussed several lifestyle modifications today and she will continue to work on diet, exercise and weight loss efforts. [name] will continue medications. Orders and follow up as documented in patient record.  Counseling Polyphagia is excessive hunger. Causes can include: low blood sugars, hypERthyroidism, PMS, lack of sleep, stress, insulin resistance, diabetes, certain medications, and diets that are deficient in protein and fiber.    2. Vitamin D deficiency Low Vitamin D level contributes to fatigue  and are associated with obesity, breast, and colon cancer. [name] will continue OTC Vitamin D 1,000 IU daily and she will follow-up for routine testing of Vitamin D, at least 2-3 times per year to avoid over-replacement.   3. Other disorder of eating Behavior modification techniques were discussed today to help [name] deal with her emotional/non-hunger eating behaviors.  We will refill bupropion 150 mg for 1 month with no refills. Orders and follow up as documented in patient record.    - buPROPion (WELLBUTRIN SR) 150 MG 12 hr tablet; Take 1 tablet (150 mg total) by mouth daily.  Dispense: 30 tablet; Refill: 0  4. Obesity with current BMI of 48.9 [name] is currently in the action stage of change. As such, her goal is to continue with weight loss efforts. She has agreed to the Category 2 Plan.   [name] will continue to adhere closely to the plan. She will increase H2O. She will increase protein at supper.  Exercise goals:  [name] will become more active.  Behavioral modification strategies: increasing lean protein intake, decreasing simple carbohydrates, increasing vegetables, increasing water intake, decreasing eating out, no skipping meals, meal planning and cooking strategies, keeping healthy foods in the home, and planning for success.  [name] has agreed to follow-up with our clinic in 3 weeks. She was informed of the importance of frequent follow-up visits to maximize her success with intensive lifestyle modifications for her multiple health conditions.  Objective:   VITALS: Per patient if applicable, see vitals. GENERAL: Alert and in no acute distress. CARDIOPULMONARY: No increased WOB. Speaking in clear sentences.  PSYCH: Pleasant and cooperative. Speech normal rate and rhythm. Affect is appropriate. Insight and judgement are  appropriate. Attention is focused, linear, and appropriate.  NEURO: Oriented as arrived to appointment on time with no prompting.   Lab Results   Component Value Date   CREATININE 0.70 04/16/2016   BUN 16 04/16/2016   NA 140 04/16/2016   K 4.1 04/16/2016   CL 104 04/16/2016   CO2 23 01/29/2014   Lab Results  Component Value Date   ALT 11 01/29/2014   AST 13 01/29/2014   ALKPHOS 46 01/29/2014   BILITOT 0.2 (L) 01/29/2014   No results found for: HGBA1C Lab Results  Component Value Date   INSULIN 22.2 03/26/2020   Lab Results  Component Value Date   TSH 2.060 03/26/2020   No results found for: CHOL, HDL, LDLCALC, LDLDIRECT, TRIG, CHOLHDL No results found for: FY10FB Lab Results  Component Value Date   WBC 8.0 12/15/2015   HGB 12.2 04/16/2016   HCT 36.0 04/16/2016   MCV 90.2 12/15/2015   PLT 316 12/15/2015   No results found for: IRON, TIBC, FERRITIN  Attestation Statements:   Reviewed by clinician on day of visit: allergies, medications, problem list, medical history, surgical history, family history, social history, and previous encounter notes.  Time spent on visit including pre-visit chart review and post-visit charting and care was 20 minutes.   I, Jackson Latino, RMA, am acting as Energy manager for [employer], DO.   I have reviewed the above documentation for accuracy and completeness, and I agree with the above. Corinna Capra, DO

## 2020-11-10 ENCOUNTER — Other Ambulatory Visit (INDEPENDENT_AMBULATORY_CARE_PROVIDER_SITE_OTHER): Payer: Self-pay | Admitting: Bariatrics

## 2020-11-10 ENCOUNTER — Other Ambulatory Visit (INDEPENDENT_AMBULATORY_CARE_PROVIDER_SITE_OTHER): Payer: Self-pay

## 2020-11-10 DIAGNOSIS — E8881 Metabolic syndrome: Secondary | ICD-10-CM

## 2020-11-10 NOTE — Telephone Encounter (Signed)
LAST APPOINTMENT DATE: 10/27/20 NEXT APPOINTMENT DATE: 11/17/20   [id number] - [place], [place] - [address] [place] [address] Phone: [phone] Fax: [phone]  Patient is requesting a refill of the following medications: Pending Prescriptions:                       Disp   Refills   metFORMIN (GLUCOPHAGE) 500 MG tablet [Phar*30 tab*0       Sig: Take 1 tablet (500 mg total) by mouth daily with          lunch.   Date last filled: 10/06/20 Previously prescribed by Dr. Manson Passey  No results found for: HGBA1C Lab Results      Component                Value               Date                      CREATININE               0.70                04/16/2016           No results found for: VD25OH  BP Readings from Last 3 Encounters: 10/06/20 : 119/81 09/08/20 : 119/74 08/21/20 : 123/80

## 2020-11-11 ENCOUNTER — Other Ambulatory Visit (INDEPENDENT_AMBULATORY_CARE_PROVIDER_SITE_OTHER): Payer: Self-pay | Admitting: Bariatrics

## 2020-11-11 DIAGNOSIS — E8881 Metabolic syndrome: Secondary | ICD-10-CM

## 2020-11-11 MED ORDER — METFORMIN HCL 500 MG PO TABS: 500.0000 mg | ORAL_TABLET | Freq: Every day | ORAL | 0 refills | Status: DC

## 2020-11-17 ENCOUNTER — Ambulatory Visit (INDEPENDENT_AMBULATORY_CARE_PROVIDER_SITE_OTHER): Payer: 59 | Admitting: Bariatrics

## 2020-11-17 ENCOUNTER — Encounter (INDEPENDENT_AMBULATORY_CARE_PROVIDER_SITE_OTHER): Payer: Self-pay | Admitting: Bariatrics

## 2020-11-17 ENCOUNTER — Other Ambulatory Visit: Payer: Self-pay

## 2020-11-17 VITALS — BP 130/84 | HR 75 | Temp 97.7°F | Ht 64.0 in | Wt 273.0 lb

## 2020-11-17 DIAGNOSIS — Z6841 Body Mass Index (BMI) 40.0 and over, adult: Secondary | ICD-10-CM | POA: Diagnosis not present

## 2020-11-17 DIAGNOSIS — E8881 Metabolic syndrome: Secondary | ICD-10-CM

## 2020-11-17 DIAGNOSIS — E559 Vitamin D deficiency, unspecified: Secondary | ICD-10-CM

## 2020-11-17 NOTE — Progress Notes (Signed)
Chief Complaint:   OBESITY [name] is here to discuss her progress with her obesity treatment plan along with follow-up of her obesity related diagnoses. [name] is on the Category 2 Plan and states she is following her eating plan approximately 90% of the time. [name] states she is doing 0 minutes 0 times per week.  Today's visit was #: 14 Starting weight: 300 lbs Starting date: 03/26/2020 Today's weight: 273 lbs Today's date: 11/17/2020 Total lbs lost to date: 27 lbs Total lbs lost since last in-office visit: 12 lbs  Interim History: [name] is down 12 lbs since her last visit. She saved foods from breakfast and ate her protein more in the evening. She still struggles with her water.  Subjective:   1. Vitamin D deficiency [name] is currently taking Vitamin D.  2. Insulin resistance [name] is currently not taking any medications. She has tried Trulicity and Metformin. She is not taking Metformin at this time.  Assessment/Plan:   1. Vitamin D deficiency Low Vitamin D level contributes to fatigue and are associated with obesity, breast, and colon cancer. [name] agrees to continue to take prescription Vitamin D 50,000 IU every week and [name] will follow-up for routine testing of Vitamin D, at least 2-3 times per year to avoid over-replacement.  2. Insulin resistance [name] will continue to work on weight loss, exercise, and decreasing simple carbohydrates to help decrease the risk of diabetes. [name] agreed to follow-up with [name] as directed to closely monitor her progress. [name] will not take any medications for insulin resistance.    3. Obesity with current BMI of 47 [name] is currently in the action stage of change. As such, her goal is to continue with weight loss efforts. She has agreed to the Category 2 Plan.   [name] will continue to adhere closely to the plan around 90%. She will be mindful eating. She will increase protein and water  intake.  Exercise goals: No exercise has been prescribed at this time.  Behavioral modification strategies: increasing lean protein intake, decreasing simple carbohydrates, increasing vegetables, increasing water intake, decreasing eating out, no skipping meals, meal planning and cooking strategies, keeping healthy foods in the home, and planning for success.  [name] has agreed to follow-up with our clinic in 3 weeks. She was informed of the importance of frequent follow-up visits to maximize her success with intensive lifestyle modifications for her multiple health conditions.   Objective:   Blood pressure 130/84, pulse 75, temperature 97.7 F (36.5 C), height 5\' 4"  (1.626 m), weight 273 lb (123.8 kg), SpO2 100 %. Body mass index is 46.86 kg/m.  General: Cooperative, alert, well developed, in no acute distress. HEENT: Conjunctivae and lids unremarkable. Cardiovascular: Regular rhythm.  Lungs: Normal work of breathing. Neurologic: No focal deficits.   Lab Results  Component Value Date   CREATININE 0.70 04/16/2016   BUN 16 04/16/2016   NA 140 04/16/2016   K 4.1 04/16/2016   CL 104 04/16/2016   CO2 23 01/29/2014   Lab Results  Component Value Date   ALT 11 01/29/2014   AST 13 01/29/2014   ALKPHOS 46 01/29/2014   BILITOT 0.2 (L) 01/29/2014   No results found for: HGBA1C Lab Results  Component Value Date   INSULIN 22.2 03/26/2020   Lab Results  Component Value Date   TSH 2.060 03/26/2020   No results found for: CHOL, HDL, LDLCALC, LDLDIRECT, TRIG, CHOLHDL No results found for: 05/24/2020 Lab Results  Component Value Date   WBC  8.0 12/15/2015   HGB 12.2 04/16/2016   HCT 36.0 04/16/2016   MCV 90.2 12/15/2015   PLT 316 12/15/2015   No results found for: IRON, TIBC, FERRITIN  Attestation Statements:   Reviewed by clinician on day of visit: allergies, medications, problem list, medical history, surgical history, family history, social history, and previous encounter  notes.  I, Jackson Latino, RMA, am acting as Energy manager for [employer], DO.   I have reviewed the above documentation for accuracy and completeness, and I agree with the above. Corinna Capra, DO

## 2020-11-17 NOTE — Progress Notes (Signed)
Order(s) created erroneously. Erroneous order ID: [id number]  Order moved by: Willene Holian M  Order move date/time: 11/17/2020 7:55 AM  Source Patient: [id number]  Source Contact: 11/10/2020  Destination Patient: [id number]  Destination Contact: 03/04/2020 

## 2020-11-17 NOTE — Progress Notes (Signed)
Order(s) created erroneously. Erroneous order ID: [id number]  Order moved by: Nyeli Holtmeyer M  Order move date/time: 11/17/2020 7:50 AM  Source Patient: [id number]  Source Contact: 11/10/2020  Destination Patient: [id number]  Destination Contact: 03/04/2020 

## 2020-11-17 NOTE — Progress Notes (Signed)
Order(s) created erroneously. Erroneous order ID: [id number]  Order moved by: Deatra Canter  Order move date/time: 11/17/2020 7:53 AM  Source Patient: [id number]  Source Contact: 11/10/2020  Destination Patient: [id number]  Destination Contact: 03/04/2020

## 2020-11-17 NOTE — Progress Notes (Signed)
Order(s) created erroneously. Erroneous order ID: [id number]  Order moved by: Deatra Canter  Order move date/time: 11/17/2020 7:54 AM  Source Patient: [id number]  Source Contact: 11/10/2020  Destination Patient: [id number]  Destination Contact: 03/04/2020

## 2020-11-17 NOTE — Progress Notes (Signed)
Order(s) created erroneously. Erroneous order ID: [id number]  Order moved by: Deatra Canter  Order move date/time: 11/17/2020 7:56 AM  Source Patient: [id number]  Source Contact: 11/10/2020  Destination Patient: [id number]  Destination Contact: 03/04/2020

## 2020-11-17 NOTE — Progress Notes (Signed)
Order(s) created erroneously. Erroneous order ID: [id number]  Order moved by: Deatra Canter  Order move date/time: 11/17/2020 7:52 AM  Source Patient: [id number]  Source Contact: 11/10/2020  Destination Patient: [id number]  Destination Contact: 03/04/2020

## 2020-11-24 ENCOUNTER — Other Ambulatory Visit (INDEPENDENT_AMBULATORY_CARE_PROVIDER_SITE_OTHER): Payer: Self-pay | Admitting: Bariatrics

## 2020-11-24 DIAGNOSIS — E8881 Metabolic syndrome: Secondary | ICD-10-CM

## 2020-11-24 MED ORDER — TRULICITY 1.5 MG/0.5ML ~~LOC~~ SOAJ: 1.5000 mg | SUBCUTANEOUS | 0 refills | Status: DC

## 2020-11-24 NOTE — Telephone Encounter (Signed)
Last seen by Dr. Manson Passey Please see message and advise.  Thank you.

## 2020-12-08 ENCOUNTER — Ambulatory Visit (INDEPENDENT_AMBULATORY_CARE_PROVIDER_SITE_OTHER): Payer: 59 | Admitting: Bariatrics

## 2020-12-08 ENCOUNTER — Other Ambulatory Visit: Payer: Self-pay

## 2020-12-08 ENCOUNTER — Encounter (INDEPENDENT_AMBULATORY_CARE_PROVIDER_SITE_OTHER): Payer: Self-pay | Admitting: Bariatrics

## 2020-12-08 VITALS — BP 134/84 | HR 77 | Temp 98.2°F | Ht 64.0 in | Wt 278.0 lb

## 2020-12-08 DIAGNOSIS — F5089 Other specified eating disorder: Secondary | ICD-10-CM | POA: Diagnosis not present

## 2020-12-08 DIAGNOSIS — E8881 Metabolic syndrome: Secondary | ICD-10-CM | POA: Diagnosis not present

## 2020-12-08 DIAGNOSIS — Z6841 Body Mass Index (BMI) 40.0 and over, adult: Secondary | ICD-10-CM

## 2020-12-08 MED ORDER — PHENTERMINE HCL 15 MG PO CAPS: 15.0000 mg | ORAL_CAPSULE | Freq: Every day | ORAL | 0 refills | Status: DC

## 2020-12-08 MED ORDER — BUPROPION HCL ER (SR) 150 MG PO TB12: 150.0000 mg | ORAL_TABLET | Freq: Every day | ORAL | 0 refills | Status: DC

## 2020-12-08 NOTE — Progress Notes (Signed)
Chief Complaint:   OBESITY [name] is here to discuss her progress with her obesity treatment plan along with follow-up of her obesity related diagnoses. [name] is on the Category 2 Plan and states she is following her eating plan approximately 70-80% of the time. [name] states she is not currently exercising.  Today's visit was #: 15 Starting weight: 300 lbs Starting date: 03/26/2020 Today's weight: 278 lbs Today's date: 12/08/2020 Total lbs lost to date: 22 Total lbs lost since last in-office visit: 0  Interim History: [name] is up 5 lbs since her las visit. She is doing well overall. She thinks that she has to lose the same 5 lbs over and over. She signed the controlled substance agreement.  Subjective:   1. Other disorder of eating [name] is taking Wellbutrin without issues.  2. Insulin resistance Pt was taking Trulicity but noted issues with constipation.   Assessment/Plan:   1. Other disorder of eating Behavior modification techniques were discussed today to help [name] deal with her emotional/non-hunger eating behaviors.  Orders and follow up as documented in patient record.   Refill- buPROPion (WELLBUTRIN SR) 150 MG 12 hr tablet; Take 1 tablet (150 mg total) by mouth daily.  Dispense: 30 tablet; Refill: 0  2. Insulin resistance [name] will continue to work on weight loss, increase exercise, and decreasing simple carbohydrates to help decrease the risk of diabetes. [name] agreed to follow-up with [name] as directed to closely monitor her progress. Increase healthy fats and protein.  3. Obesity with current BMI of 47.9  [name] is currently in the action stage of change. As such, her goal is to continue with weight loss efforts. She has agreed to the Category 2 Plan and the Pescatarian Plan.   We discussed various medication options to help [name] with her weight loss efforts and we both agreed to start phentermine 15 mg. Start- phentermine 15 MG  capsule; Take 1 capsule (15 mg total) by mouth daily with lunch.  Dispense: 30 capsule; Refill: 0  Meal planning. Pt will adhere closely to the plan 80-90% of the time. Start phentermine 15 mg.  Exercise goals:  As is  Behavioral modification strategies: increasing lean protein intake, decreasing simple carbohydrates, increasing vegetables, increasing water intake, decreasing eating out, no skipping meals, meal planning and cooking strategies, keeping healthy foods in the home, and planning for success.  [name] has agreed to follow-up with our clinic in 3 weeks, fasting. She was informed of the importance of frequent follow-up visits to maximize her success with intensive lifestyle modifications for her multiple health conditions.   Objective:   Blood pressure 134/84, pulse 77, temperature 98.2 F (36.8 C), height 5\' 4"  (1.626 m), weight 278 lb (126.1 kg), SpO2 98 %. Body mass index is 47.72 kg/m.  General: Cooperative, alert, well developed, in no acute distress. HEENT: Conjunctivae and lids unremarkable. Cardiovascular: Regular rhythm.  Lungs: Normal work of breathing. Neurologic: No focal deficits.   Lab Results  Component Value Date   CREATININE 0.70 04/16/2016   BUN 16 04/16/2016   NA 140 04/16/2016   K 4.1 04/16/2016   CL 104 04/16/2016   CO2 23 01/29/2014   Lab Results  Component Value Date   ALT 11 01/29/2014   AST 13 01/29/2014   ALKPHOS 46 01/29/2014   BILITOT 0.2 (L) 01/29/2014   No results found for: HGBA1C Lab Results  Component Value Date   INSULIN 22.2 03/26/2020   Lab Results  Component Value Date  TSH 2.060 03/26/2020   No results found for: CHOL, HDL, LDLCALC, LDLDIRECT, TRIG, CHOLHDL No results found for: [id number] Lab Results  Component Value Date   WBC 8.0 12/15/2015   HGB 12.2 04/16/2016   HCT 36.0 04/16/2016   MCV 90.2 12/15/2015   PLT 316 12/15/2015    Attestation Statements:   Reviewed by clinician on day of visit: allergies,  medications, problem list, medical history, surgical history, family history, social history, and previous encounter notes.  [name], CMA, am acting as Energy manager for [employer], DO.  I have reviewed the above documentation for accuracy and completeness, and I agree with the above. Tiffany Capra, DO

## 2020-12-10 ENCOUNTER — Encounter (INDEPENDENT_AMBULATORY_CARE_PROVIDER_SITE_OTHER): Payer: Self-pay | Admitting: Bariatrics

## 2020-12-11 ENCOUNTER — Other Ambulatory Visit (INDEPENDENT_AMBULATORY_CARE_PROVIDER_SITE_OTHER): Payer: Self-pay | Admitting: Bariatrics

## 2020-12-11 DIAGNOSIS — E8881 Metabolic syndrome: Secondary | ICD-10-CM

## 2020-12-29 ENCOUNTER — Telehealth (INDEPENDENT_AMBULATORY_CARE_PROVIDER_SITE_OTHER): Payer: 59 | Admitting: Bariatrics

## 2020-12-29 DIAGNOSIS — F5089 Other specified eating disorder: Secondary | ICD-10-CM | POA: Diagnosis not present

## 2020-12-29 DIAGNOSIS — E66813 Obesity, class 3: Secondary | ICD-10-CM

## 2020-12-29 DIAGNOSIS — Z6841 Body Mass Index (BMI) 40.0 and over, adult: Secondary | ICD-10-CM

## 2020-12-29 DIAGNOSIS — R632 Polyphagia: Secondary | ICD-10-CM | POA: Diagnosis not present

## 2020-12-29 MED ORDER — PHENTERMINE HCL 15 MG PO CAPS: 15.0000 mg | ORAL_CAPSULE | Freq: Every day | ORAL | 0 refills | Status: DC

## 2020-12-29 NOTE — Progress Notes (Signed)
TeleHealth Visit:  Due to the COVID-19 pandemic, this visit was completed with telemedicine (audio/video) technology to reduce patient and provider exposure as well as to preserve personal protective equipment.   [place] has verbally consented to this TeleHealth visit. The patient is located at home, the provider is located at the [facility] office. The participants in this visit include the listed provider and patient. The visit was conducted today via video.  Chief Complaint: OBESITY [name] is here to discuss her progress with her obesity treatment plan along with follow-up of her obesity related diagnoses. [name] is on the Category 2 Plan and states she is following her eating plan approximately 90% of the time. [name] states she is walking for 20 minutes 4 times per week.  Today's visit was #: 16 Starting weight: 300 lbs Starting date: 03/26/2020  Interim History: [name] states that she is sick and needs a "MyChart" visit. She is down 5 lbs.  Subjective:   1. Polyphagia [name] states taking Phentermine is helping with her appetite.   2. Other disorder of eating [name] is taking Wellbutrin currently.   Assessment/Plan:   1. Polyphagia Intensive lifestyle modifications are the first line treatment for this issue. We discussed several lifestyle modifications today and she will continue to work on diet, exercise and weight loss efforts. [name] will continue Phentermine. She will keep protein level high. Orders and follow up as documented in patient record.  Counseling Polyphagia is excessive hunger. Causes can include: low blood sugars, hypERthyroidism, PMS, lack of sleep, stress, insulin resistance, diabetes, certain medications, and diets that are deficient in protein and fiber.    2. Other disorder of eating Behavior modification techniques were discussed today to help [name] deal with her emotional/non-hunger eating behaviors.  [name] will  continue Wellbutrin. She will continue activities. Orders and follow up as documented in patient record.    3. Obesity with current BMI of 47.9 [name] is currently in the action stage of change. As such, her goal is to continue with weight loss efforts. She has agreed to the Category 2 Plan.   [name] will continue meal planning and she will continue intentional eating. We will refill Phentermine 15 mg daily with lunch for 1 month with no refills. She will increase her water intake.  - phentermine 15 MG capsule; Take 1 capsule (15 mg total) by mouth daily with lunch.  Dispense: 30 capsule; Refill: 0  Exercise goals:  As is.  Behavioral modification strategies: increasing lean protein intake, decreasing simple carbohydrates, increasing vegetables, increasing water intake, decreasing eating out, no skipping meals, meal planning and cooking strategies, keeping healthy foods in the home, and planning for success.  [name] has agreed to follow-up with our clinic in 3 weeks with Adah Salvage, FNP and 6 weeks with myself. She was informed of the importance of frequent follow-up visits to maximize her success with intensive lifestyle modifications for her multiple health conditions.  Objective:   VITALS: Per patient if applicable, see vitals. GENERAL: Alert and in no acute distress. CARDIOPULMONARY: No increased WOB. Speaking in clear sentences.  PSYCH: Pleasant and cooperative. Speech normal rate and rhythm. Affect is appropriate. Insight and judgement are appropriate. Attention is focused, linear, and appropriate.  NEURO: Oriented as arrived to appointment on time with no prompting.   Lab Results  Component Value Date   CREATININE 0.70 04/16/2016   BUN 16 04/16/2016   NA 140 04/16/2016   K 4.1 04/16/2016   CL 104 04/16/2016  CO2 23 01/29/2014   Lab Results  Component Value Date   ALT 11 01/29/2014   AST 13 01/29/2014   ALKPHOS 46 01/29/2014   BILITOT 0.2 (L) 01/29/2014   No  results found for: HGBA1C Lab Results  Component Value Date   INSULIN 22.2 03/26/2020   Lab Results  Component Value Date   TSH 2.060 03/26/2020   No results found for: CHOL, HDL, LDLCALC, LDLDIRECT, TRIG, CHOLHDL No results found for: [id number] Lab Results  Component Value Date   WBC 8.0 12/15/2015   HGB 12.2 04/16/2016   HCT 36.0 04/16/2016   MCV 90.2 12/15/2015   PLT 316 12/15/2015   No results found for: IRON, TIBC, FERRITIN  Attestation Statements:   Reviewed by clinician on day of visit: allergies, medications, problem list, medical history, surgical history, family history, social history, and previous encounter notes.  I, Jackson Latino, RMA, am acting as Energy manager for [employer], DO.   I have reviewed the above documentation for accuracy and completeness, and I agree with the above. Corinna Capra, DO

## 2021-01-26 ENCOUNTER — Encounter (INDEPENDENT_AMBULATORY_CARE_PROVIDER_SITE_OTHER): Payer: Self-pay | Admitting: Family Medicine

## 2021-01-26 ENCOUNTER — Ambulatory Visit (INDEPENDENT_AMBULATORY_CARE_PROVIDER_SITE_OTHER): Payer: 59 | Admitting: Family Medicine

## 2021-01-26 ENCOUNTER — Other Ambulatory Visit: Payer: Self-pay

## 2021-01-26 VITALS — BP 129/86 | HR 69 | Temp 98.0°F | Ht 64.0 in | Wt 267.0 lb

## 2021-01-26 DIAGNOSIS — Z6841 Body Mass Index (BMI) 40.0 and over, adult: Secondary | ICD-10-CM | POA: Diagnosis not present

## 2021-01-26 DIAGNOSIS — F5089 Other specified eating disorder: Secondary | ICD-10-CM

## 2021-01-26 DIAGNOSIS — Z9189 Other specified personal risk factors, not elsewhere classified: Secondary | ICD-10-CM

## 2021-01-26 DIAGNOSIS — R632 Polyphagia: Secondary | ICD-10-CM

## 2021-01-26 MED ORDER — PHENTERMINE HCL 37.5 MG PO TABS: ORAL_TABLET | ORAL | 0 refills | Status: DC

## 2021-01-26 MED ORDER — BUPROPION HCL ER (SR) 150 MG PO TB12: 150.0000 mg | ORAL_TABLET | Freq: Every day | ORAL | 0 refills | Status: DC

## 2021-01-27 NOTE — Progress Notes (Signed)
Chief Complaint:   OBESITY Mersadie is here to discuss her progress with her obesity treatment plan along with follow-up of her obesity related diagnoses. Kamani is on the Category 2 Plan and states she is following her eating plan approximately 60% of the time. Crystin states she is walking for 30 minutes 2 times per week.  Today's visit was #: 17 Starting weight: 300 lbs Starting date: 03/26/2020 Today's weight: 267 lbs Today's date: 01/26/2021 Total lbs lost to date: 33 Total lbs lost since last in-office visit: 11  Interim History: Geselle's last office evaluation was on 12/08/2020. She has lost 11 lbs since then. Her scale at home showed a 7 lb weight gain over Thanksgiving, and then she got back on it, and she has been journaling for a week and a half.  Subjective:   1. Polyphagia Kalliope has been on phentermine 15 mg for 2 months. One week prior to her menstrual onset, she notes increased hunger and she was eating more. She has no side effects of medication and she is tolerating it well.  2. Other disorder of eating Maygan's symptoms are stable currently.  3. At risk for side effect of medication Zhania is at risk for drug side effects due to change in dose of phentermine.  Assessment/Plan:  No orders of the defined types were placed in this encounter.   Medications Discontinued During This Encounter  Medication Reason   phentermine 15 MG capsule    buPROPion (WELLBUTRIN SR) 150 MG 12 hr tablet Reorder     Meds ordered this encounter  Medications   buPROPion (WELLBUTRIN SR) 150 MG 12 hr tablet    Sig: Take 1 tablet (150 mg total) by mouth daily.    Dispense:  30 tablet    Refill:  0   phentermine (ADIPEX-P) 37.5 MG tablet    Sig: 0.5 tab qd 3 wks of mo and 1 tab qd starting 1 wk prior to menses    Dispense:  30 tablet    Refill:  0     1. Lessie Dings agreed to change to phentermine 37.5 mg tablets and she is to take 1/2 tablet q daily for  3 weeks per month; then 1 full tablet q daily 1 week prior to her menses.   - phentermine (ADIPEX-P) 37.5 MG tablet; 0.5 tab qd 3 wks of mo and 1 tab qd starting 1 wk prior to menses  Dispense: 30 tablet; Refill: 0  2. Other disorder of eating Handout was given on emotional eating, but she is currently not sure if she does much of it. We will refill Wellbutrin SR for 1 month. Orders and follow up as documented in patient record.   - buPROPion (WELLBUTRIN SR) 150 MG 12 hr tablet; Take 1 tablet (150 mg total) by mouth daily.  Dispense: 30 tablet; Refill: 0  3. At risk for side effect of medication Makiah was given approximately 23 minutes of drug side effect counseling today.  We discussed side effect possibility and risk versus benefits. Annalisa agreed to the medication and will contact this office if these side effects are intolerable.  Repetitive spaced learning was employed today to elicit superior memory formation and behavioral change.  4. Obesity with current BMI of 47.9 Asmaa is currently in the action stage of change. As such, her goal is to continue with weight loss efforts. She has agreed to keeping a food journal and adhering to recommended goals of 1200-1400 calories and 90+ grams of  protein daily.   Keli is to increase her water intake to 80 oz per day. She is to bring in her food log to her next office visit. Holiday strategies were discussed with the patient and handout was given.  Exercise goals: As is, and increase as tolerated.  Behavioral modification strategies: increasing lean protein intake, decreasing simple carbohydrates, holiday eating strategies , and planning for success.  Keatyn has agreed to follow-up with our clinic in 3 weeks. She was informed of the importance of frequent follow-up visits to maximize her success with intensive lifestyle modifications for her multiple health conditions.   Objective:   Blood pressure 129/86, pulse 69, temperature 98  F (36.7 C), height 5\' 4"  (1.626 m), weight 267 lb (121.1 kg), SpO2 100 %. Body mass index is 45.83 kg/m.  General: Cooperative, alert, well developed, in no acute distress. HEENT: Conjunctivae and lids unremarkable. Cardiovascular: Regular rhythm.  Lungs: Normal work of breathing. Neurologic: No focal deficits.   Lab Results  Component Value Date   CREATININE 0.70 04/16/2016   BUN 16 04/16/2016   NA 140 04/16/2016   K 4.1 04/16/2016   CL 104 04/16/2016   CO2 23 01/29/2014   Lab Results  Component Value Date   ALT 11 01/29/2014   AST 13 01/29/2014   ALKPHOS 46 01/29/2014   BILITOT 0.2 (L) 01/29/2014   No results found for: HGBA1C Lab Results  Component Value Date   INSULIN 22.2 03/26/2020   Lab Results  Component Value Date   TSH 2.060 03/26/2020   No results found for: CHOL, HDL, LDLCALC, LDLDIRECT, TRIG, CHOLHDL No results found for: 05/24/2020 Lab Results  Component Value Date   WBC 8.0 12/15/2015   HGB 12.2 04/16/2016   HCT 36.0 04/16/2016   MCV 90.2 12/15/2015   PLT 316 12/15/2015   No results found for: IRON, TIBC, FERRITIN  Attestation Statements:   Reviewed by clinician on day of visit: allergies, medications, problem list, medical history, surgical history, family history, social history, and previous encounter notes.   12/17/2015, am acting as transcriptionist for Trude Mcburney, DO.  I have reviewed the above documentation for accuracy and completeness, and I agree with the above. [employer], D.O.  The 21st Century Cures Act was signed into law in 2016 which includes the topic of electronic health records.  This provides immediate access to information in MyChart.  This includes consultation notes, operative notes, office notes, lab results and pathology reports.  If you have any questions about what you read please let 2017 know at your next visit so we can discuss your concerns and take corrective action if need be.  We are right here  with you.

## 2021-02-19 ENCOUNTER — Other Ambulatory Visit (INDEPENDENT_AMBULATORY_CARE_PROVIDER_SITE_OTHER): Payer: Self-pay | Admitting: Family Medicine

## 2021-02-19 DIAGNOSIS — F5089 Other specified eating disorder: Secondary | ICD-10-CM

## 2021-02-19 NOTE — Telephone Encounter (Signed)
LOV w/ Dr. O

## 2021-02-23 ENCOUNTER — Other Ambulatory Visit (INDEPENDENT_AMBULATORY_CARE_PROVIDER_SITE_OTHER): Payer: Self-pay | Admitting: Family Medicine

## 2021-02-23 DIAGNOSIS — R632 Polyphagia: Secondary | ICD-10-CM

## 2021-02-23 DIAGNOSIS — F5089 Other specified eating disorder: Secondary | ICD-10-CM

## 2021-02-24 ENCOUNTER — Other Ambulatory Visit: Payer: Self-pay

## 2021-02-24 ENCOUNTER — Encounter (INDEPENDENT_AMBULATORY_CARE_PROVIDER_SITE_OTHER): Payer: Self-pay | Admitting: Bariatrics

## 2021-02-24 ENCOUNTER — Ambulatory Visit (INDEPENDENT_AMBULATORY_CARE_PROVIDER_SITE_OTHER): Payer: 59 | Admitting: Bariatrics

## 2021-02-24 VITALS — BP 136/87 | HR 77 | Temp 97.7°F | Ht 64.0 in | Wt 264.0 lb

## 2021-02-24 DIAGNOSIS — E559 Vitamin D deficiency, unspecified: Secondary | ICD-10-CM | POA: Diagnosis not present

## 2021-02-24 DIAGNOSIS — F5089 Other specified eating disorder: Secondary | ICD-10-CM

## 2021-02-24 DIAGNOSIS — Z6841 Body Mass Index (BMI) 40.0 and over, adult: Secondary | ICD-10-CM

## 2021-02-24 DIAGNOSIS — R632 Polyphagia: Secondary | ICD-10-CM | POA: Diagnosis not present

## 2021-02-24 MED ORDER — PHENTERMINE HCL 37.5 MG PO TABS: ORAL_TABLET | ORAL | 0 refills | Status: DC

## 2021-02-24 NOTE — Progress Notes (Signed)
Chief Complaint:   OBESITY [name] is here to discuss her progress with her obesity treatment plan along with follow-up of her obesity related diagnoses. [name] is on the Category 2 Plan and states she is following her eating plan approximately 50% of the time. [name] states she is doing 0 minutes 0 times per week.  Today's visit was #: 18 Starting weight: 300 lbs Starting date: 03/26/2020 Today's weight: 264 lbs Today's date: 02/24/2021 Total lbs lost to date: 36 lbs Total lbs lost since last in-office visit: 3 lbs  Interim History: [name] is down an additional 3 lbs over the holidays (she had the flu possible Covid and she was dealing with the death of her grandfather). She feels like the Phentermine is helping.   Subjective:   1. Vitamin D deficiency [name] is taking Vitamin D currently.   2. [name] is currently taking Phentermine and she will continue taking.   3. Other disorder of eating [name] is taking currently [name].  Assessment/Plan:   1. Vitamin D deficiency Low Vitamin D level contributes to fatigue and are associated with obesity, breast, and colon cancer. Adhira will continue to take Vitamin D and she will follow-up for routine testing of Vitamin D, at least 2-3 times per year to avoid over-replacement.  2. Polyphagia Intensive lifestyle modifications are the first line treatment for this issue. Christy will continue Phentermine. We discussed several lifestyle modifications today and she will continue to work on diet, exercise and weight loss efforts. Orders and follow up as documented in patient record.  Counseling Polyphagia is excessive hunger. Causes can include: low blood sugars, hypERthyroidism, PMS, lack of sleep, stress, insulin resistance, diabetes, certain medications, and diets that are deficient in protein and fiber.   - phentermine (ADIPEX-P) 37.5 MG tablet; 0.5 tab qd 3 wks of mo and 1 tab qd starting 1 wk prior to  menses  Dispense: 30 tablet; Refill: 0  3. Other disorder of eating [name] will continue taking [name]. Behavior modification techniques were discussed today to help [name] deal with her emotional/non-hunger eating behaviors.  Orders and follow up as documented in patient record.    4. Obesity with current BMI of 45.3 [name] is currently in the action stage of change. As such, her goal is to continue with weight loss efforts. She has agreed to the Category 2 Plan.   [name] will continue meal planning and she will continue intentional eating. We will refill Phentermine 37.5 mg with no refills. She will increase her water intake.   - phentermine (ADIPEX-P) 37.5 MG tablet; 0.5 tab qd 3 wks of mo and 1 tab qd starting 1 wk prior to menses  Dispense: 30 tablet; Refill: 0  Exercise goals:  [name] will exercise using treadmill and she will go slowly.  Behavioral modification strategies: increasing lean protein intake, decreasing simple carbohydrates, increasing vegetables, increasing water intake, and keeping healthy foods in the home.  [name] has agreed to follow-up with our clinic in 3-4 weeks with myself or Dr. Sharee Holster. She was informed of the importance of frequent follow-up visits to maximize her success with intensive lifestyle modifications for her multiple health conditions.   Objective:   Blood pressure 136/87, pulse 77, temperature 97.7 F (36.5 C), height 5\' 4"  (1.626 m), weight 264 lb (119.7 kg), SpO2 99 %. Body mass index is 45.32 kg/m.  General: Cooperative, alert, well developed, in no acute distress. HEENT: Conjunctivae and lids unremarkable. Cardiovascular: Regular rhythm.  Lungs: Normal work of breathing.  Neurologic: No focal deficits.   Lab Results  Component Value Date   CREATININE 0.70 04/16/2016   BUN 16 04/16/2016   NA 140 04/16/2016   K 4.1 04/16/2016   CL 104 04/16/2016   CO2 23 01/29/2014   Lab Results  Component Value Date   ALT 11  01/29/2014   AST 13 01/29/2014   ALKPHOS 46 01/29/2014   BILITOT 0.2 (L) 01/29/2014   No results found for: HGBA1C Lab Results  Component Value Date   INSULIN 22.2 03/26/2020   Lab Results  Component Value Date   TSH 2.060 03/26/2020   No results found for: CHOL, HDL, LDLCALC, LDLDIRECT, TRIG, CHOLHDL No results found for: [id number] Lab Results  Component Value Date   WBC 8.0 12/15/2015   HGB 12.2 04/16/2016   HCT 36.0 04/16/2016   MCV 90.2 12/15/2015   PLT 316 12/15/2015   No results found for: IRON, TIBC, FERRITIN  Attestation Statements:   Reviewed by clinician on day of visit: allergies, medications, problem list, medical history, surgical history, family history, social history, and previous encounter notes.  I, Jackson Latino, RMA, am acting as Energy manager for [employer], DO.  I have reviewed the above documentation for accuracy and completeness, and I agree with the above. Corinna Capra, DO

## 2021-02-25 NOTE — Telephone Encounter (Signed)
Pt last seen by Dr. Brown.  

## 2021-03-01 ENCOUNTER — Encounter (INDEPENDENT_AMBULATORY_CARE_PROVIDER_SITE_OTHER): Payer: Self-pay | Admitting: Bariatrics

## 2021-03-05 ENCOUNTER — Other Ambulatory Visit (INDEPENDENT_AMBULATORY_CARE_PROVIDER_SITE_OTHER): Payer: Self-pay | Admitting: Family Medicine

## 2021-03-05 DIAGNOSIS — F5089 Other specified eating disorder: Secondary | ICD-10-CM

## 2021-03-23 ENCOUNTER — Ambulatory Visit (INDEPENDENT_AMBULATORY_CARE_PROVIDER_SITE_OTHER): Payer: 59 | Admitting: Bariatrics

## 2021-03-23 ENCOUNTER — Encounter (INDEPENDENT_AMBULATORY_CARE_PROVIDER_SITE_OTHER): Payer: Self-pay | Admitting: Bariatrics

## 2021-03-23 ENCOUNTER — Other Ambulatory Visit: Payer: Self-pay

## 2021-03-23 VITALS — BP 123/87 | HR 89 | Temp 97.8°F | Ht 64.0 in | Wt 262.0 lb

## 2021-03-23 DIAGNOSIS — E8881 Metabolic syndrome: Secondary | ICD-10-CM | POA: Diagnosis not present

## 2021-03-23 DIAGNOSIS — F5089 Other specified eating disorder: Secondary | ICD-10-CM

## 2021-03-23 DIAGNOSIS — Z6841 Body Mass Index (BMI) 40.0 and over, adult: Secondary | ICD-10-CM

## 2021-03-23 DIAGNOSIS — E559 Vitamin D deficiency, unspecified: Secondary | ICD-10-CM | POA: Diagnosis not present

## 2021-03-23 DIAGNOSIS — E669 Obesity, unspecified: Secondary | ICD-10-CM

## 2021-03-23 DIAGNOSIS — R632 Polyphagia: Secondary | ICD-10-CM

## 2021-03-23 MED ORDER — BUPROPION HCL ER (SR) 150 MG PO TB12: 150.0000 mg | ORAL_TABLET | Freq: Every day | ORAL | 0 refills | Status: DC

## 2021-03-23 MED ORDER — PHENTERMINE HCL 37.5 MG PO CAPS: 37.5000 mg | ORAL_CAPSULE | ORAL | 0 refills | Status: DC

## 2021-03-23 NOTE — Progress Notes (Signed)
Chief Complaint:   OBESITY [name] is here to discuss her progress with her obesity treatment plan along with follow-up of her obesity related diagnoses. [name] is on the Category 2 Plan and states she is following her eating plan approximately 85% of the time. [name] states she is walking for 15 minutes 4 times per week and using bands for 30 minutes 4 times per week.  Today's visit was #: 19 Starting weight: 300 lbs Starting date: 03/26/2020 Today's weight: 262 lbs Today's date: 03/23/2021 Total lbs lost to date: 38 lbs Total lbs lost since last in-office visit: 2 lbs  Interim History: [name] is down an additional 2 lbs since her last visit.  Subjective:   1. Polyphagia [name] is taking phentermine currently.  2. Vitamin D deficiency [name] is currently taking Vitamin D 1,000 IU.  3. Insulin resistance [name]'s last insulin resistance level was 22.2 on 03/26/2020.  4. Other disorder of eating [name] is currently taking Wellbutrin SR.  Assessment/Plan:   1. Polyphagia Intensive lifestyle modifications are the first line treatment for this issue. [name] will continue taking phentermine. She will keep protein high. We discussed several lifestyle modifications today and she will continue to work on diet, exercise and weight loss efforts. Orders and follow up as documented in patient record.  Counseling Polyphagia is excessive hunger. Causes can include: low blood sugars, hypERthyroidism, PMS, lack of sleep, stress, insulin resistance, diabetes, certain medications, and diets that are deficient in protein and fiber.    - phentermine 37.5 MG capsule; Take 1 capsule (37.5 mg total) by mouth every morning.  Dispense: 30 capsule; Refill: 0  2. Vitamin D deficiency Low Vitamin D level contributes to fatigue and are associated with obesity, breast, and colon cancer. We will check Vitamin D today and Sharisse will follow-up for routine testing of Vitamin D, at least  2-3 times per year to avoid over-replacement.  - VITAMIN D 25 Hydroxy (Vit-D Deficiency, Fractures)  3. Insulin resistance We will check A1C, insulin and CMP today.[name] will continue to work on weight loss, exercise, and decreasing simple carbohydrates to help decrease the risk of diabetes. [name] agreed to follow-up with [name] as directed to closely monitor her progress.  - Insulin, random - Hemoglobin A1c - Comprehensive metabolic panel  4. Other disorder of eating We will refill Wellbutrin SR 150 mg for 1 month with no refills. Behavior modification techniques were discussed today to help [name] deal with her emotional/non-hunger eating behaviors.  Orders and follow up as documented in patient record.    - buPROPion (WELLBUTRIN SR) 150 MG 12 hr tablet; Take 1 tablet (150 mg total) by mouth daily.  Dispense: 30 tablet; Refill: 0  5. Obesity with current BMI of 45.1 [name] is currently in the action stage of change. As such, her goal is to continue with weight loss efforts. She has agreed to the Category 2 Plan.   [name] will continue meal planning. She will continue intentional eating. [name] was changed from phentermine 0.5 mg weekly to phentermine 7.5 mg daily for 1 month with no refills.   - phentermine 37.5 MG capsule; Take 1 capsule (37.5 mg total) by mouth every morning.  Dispense: 30 capsule; Refill: 0  Exercise goals:  As is.  Behavioral modification strategies: increasing lean protein intake, decreasing simple carbohydrates, increasing vegetables, increasing water intake, decreasing eating out, no skipping meals, meal planning and cooking strategies, keeping healthy foods in the home, and planning for success.  [name] has agreed to  follow-up with our clinic in 3-4 weeks. She was informed of the importance of frequent follow-up visits to maximize her success with intensive lifestyle modifications for her multiple health conditions.   [name] was informed we would  discuss her lab results at her next visit unless there is a critical issue that needs to be addressed sooner. [name] agreed to keep her next visit at the agreed upon time to discuss these results.  Objective:   Blood pressure 123/87, pulse 89, temperature 97.8 F (36.6 C), height 5\' 4"  (1.626 m), weight 262 lb (118.8 kg), SpO2 100 %. Body mass index is 44.97 kg/m.  General: Cooperative, alert, well developed, in no acute distress. HEENT: Conjunctivae and lids unremarkable. Cardiovascular: Regular rhythm.  Lungs: Normal work of breathing. Neurologic: No focal deficits.   Lab Results  Component Value Date   CREATININE 0.70 04/16/2016   BUN 16 04/16/2016   NA 140 04/16/2016   K 4.1 04/16/2016   CL 104 04/16/2016   CO2 23 01/29/2014   Lab Results  Component Value Date   ALT 11 01/29/2014   AST 13 01/29/2014   ALKPHOS 46 01/29/2014   BILITOT 0.2 (L) 01/29/2014   No results found for: HGBA1C Lab Results  Component Value Date   INSULIN 22.2 03/26/2020   Lab Results  Component Value Date   TSH 2.060 03/26/2020   No results found for: CHOL, HDL, LDLCALC, LDLDIRECT, TRIG, CHOLHDL No results found for: 05/24/2020 Lab Results  Component Value Date   WBC 8.0 12/15/2015   HGB 12.2 04/16/2016   HCT 36.0 04/16/2016   MCV 90.2 12/15/2015   PLT 316 12/15/2015   No results found for: IRON, TIBC, FERRITIN  Attestation Statements:   Reviewed by clinician on day of visit: allergies, medications, problem list, medical history, surgical history, family history, social history, and previous encounter notes.  I, 12/17/2015, RMA, am acting as Jackson Latino for Energy manager, DO.  I have reviewed the above documentation for accuracy and completeness, and I agree with the above. [employer], DO

## 2021-03-24 ENCOUNTER — Encounter (INDEPENDENT_AMBULATORY_CARE_PROVIDER_SITE_OTHER): Payer: Self-pay | Admitting: Bariatrics

## 2021-03-24 LAB — COMPREHENSIVE METABOLIC PANEL
ALT: 7 IU/L (ref 0–32)
AST: 11 IU/L (ref 0–40)
Albumin/Globulin Ratio: 2.1 (ref 1.2–2.2)
Albumin: 4.4 g/dL (ref 3.8–4.8)
Alkaline Phosphatase: 44 IU/L (ref 44–121)
BUN/Creatinine Ratio: 24 — ABNORMAL HIGH (ref 9–23)
BUN: 16 mg/dL (ref 6–24)
Bilirubin Total: 0.2 mg/dL (ref 0.0–1.2)
CO2: 23 mmol/L (ref 20–29)
Calcium: 9 mg/dL (ref 8.7–10.2)
Chloride: 105 mmol/L (ref 96–106)
Creatinine, Ser: 0.68 mg/dL (ref 0.57–1.00)
Globulin, Total: 2.1 g/dL (ref 1.5–4.5)
Glucose: 80 mg/dL (ref 70–99)
Potassium: 4.5 mmol/L (ref 3.5–5.2)
Sodium: 140 mmol/L (ref 134–144)
Total Protein: 6.5 g/dL (ref 6.0–8.5)
eGFR: 111 mL/min/{1.73_m2} (ref >59)

## 2021-03-24 LAB — HEMOGLOBIN A1C
Est. average glucose Bld gHb Est-mCnc: 108 mg/dL
Hgb A1c MFr Bld: 5.4 % (ref 4.8–5.6)

## 2021-03-24 LAB — INSULIN, RANDOM: INSULIN: 16.6 u[IU]/mL (ref 2.6–24.9)

## 2021-03-24 LAB — VITAMIN D 25 HYDROXY (VIT D DEFICIENCY, FRACTURES): Vit D, 25-Hydroxy: 29.7 ng/mL — ABNORMAL LOW (ref 30.0–100.0)

## 2021-04-13 ENCOUNTER — Ambulatory Visit (INDEPENDENT_AMBULATORY_CARE_PROVIDER_SITE_OTHER): Payer: 59 | Admitting: Bariatrics

## 2021-04-13 ENCOUNTER — Encounter (INDEPENDENT_AMBULATORY_CARE_PROVIDER_SITE_OTHER): Payer: Self-pay | Admitting: Bariatrics

## 2021-04-13 ENCOUNTER — Other Ambulatory Visit: Payer: Self-pay

## 2021-04-13 VITALS — BP 144/87 | HR 76 | Temp 98.2°F | Ht 64.0 in | Wt 261.0 lb

## 2021-04-13 DIAGNOSIS — E8881 Metabolic syndrome: Secondary | ICD-10-CM | POA: Diagnosis not present

## 2021-04-13 DIAGNOSIS — Z6841 Body Mass Index (BMI) 40.0 and over, adult: Secondary | ICD-10-CM

## 2021-04-13 DIAGNOSIS — R632 Polyphagia: Secondary | ICD-10-CM | POA: Diagnosis not present

## 2021-04-13 DIAGNOSIS — E669 Obesity, unspecified: Secondary | ICD-10-CM

## 2021-04-13 MED ORDER — PHENTERMINE HCL 37.5 MG PO CAPS: 37.5000 mg | ORAL_CAPSULE | ORAL | 0 refills | Status: DC

## 2021-04-13 NOTE — Progress Notes (Signed)
Chief Complaint:   OBESITY [name] is here to discuss her progress with her obesity treatment plan along with follow-up of her obesity related diagnoses. [name] is on the Category 2 Plan and states she is following her eating plan approximately 85% of the time. [name] states she is doing 0 minutes 0 times per week.  Today's visit was #: 20 Starting weight: 300 lbs Starting date: 03/26/2020 Today's weight: 261 lbs Today's date:04/13/2021 Total lbs lost to date: 39 lbs Total lbs lost since last in-office visit: 1 lb  Interim History: [name] is down 1 additional pound. She started to struggle with the protein. She is skipping meals.   Subjective:   1. Polyphagia [name] is taking Phentermine currently.   2. Insulin resistance [name] is not taking medication for speciality insulin resistance.   Assessment/Plan:   1. Polyphagia Intensive lifestyle modifications are the first line treatment for this issue. Sanyiah will continue taking Phentermine 37.5 mg. She will keep her carbohydrates low. We discussed several lifestyle modifications today and she will continue to work on diet, exercise and weight loss efforts. Orders and follow up as documented in patient record.  Counseling Polyphagia is excessive hunger. Causes can include: low blood sugars, hypERthyroidism, PMS, lack of sleep, stress, insulin resistance, diabetes, certain medications, and diets that are deficient in protein and fiber.    2. Insulin resistance [name] will keep carbohydrates and starches low. She will continue to work on weight loss, exercise, and decreasing simple carbohydrates to help decrease the risk of diabetes. [name] agreed to follow-up with [name] as directed to closely monitor her progress.  3. Obesity with current BMI of 44.9 [name] is currently in the action stage of change. As such, her goal is to continue with weight loss efforts. She has agreed to the Category 2 Plan.   [name]  will continue meal planning and she will continue intentional eating. We will refill Phentermine 37.5 mg for 1 month with no refills.   - phentermine 37.5 MG capsule; Take 1 capsule (37.5 mg total) by mouth every morning.  Dispense: 30 capsule; Refill: 0  Exercise goals:  [name] has right knee pain and wears a brace.  Behavioral modification strategies: increasing lean protein intake, decreasing simple carbohydrates, increasing vegetables, increasing water intake, decreasing eating out, no skipping meals, meal planning and cooking strategies, keeping healthy foods in the home, and planning for success.  [name] has agreed to follow-up with our clinic in 4 weeks. She was informed of the importance of frequent follow-up visits to maximize her success with intensive lifestyle modifications for her multiple health conditions.   Objective:   Blood pressure (!) 144/87, pulse 76, temperature 98.2 F (36.8 C), height 5\' 4"  (1.626 m), weight 261 lb (118.4 kg), SpO2 98 %. Body mass index is 44.8 kg/m.  General: Cooperative, alert, well developed, in no acute distress. HEENT: Conjunctivae and lids unremarkable. Cardiovascular: Regular rhythm.  Lungs: Normal work of breathing. Neurologic: No focal deficits.   Lab Results  Component Value Date   CREATININE 0.68 03/23/2021   BUN 16 03/23/2021   NA 140 03/23/2021   K 4.5 03/23/2021   CL 105 03/23/2021   CO2 23 03/23/2021   Lab Results  Component Value Date   ALT 7 03/23/2021   AST 11 03/23/2021   ALKPHOS 44 03/23/2021   BILITOT 0.2 03/23/2021   Lab Results  Component Value Date   HGBA1C 5.4 03/23/2021   Lab Results  Component Value Date   INSULIN  16.6 03/23/2021   INSULIN 22.2 03/26/2020   Lab Results  Component Value Date   TSH 2.060 03/26/2020   No results found for: CHOL, HDL, LDLCALC, LDLDIRECT, TRIG, CHOLHDL Lab Results  Component Value Date   VD25OH 29.7 (L) 03/23/2021   Lab Results  Component Value Date   WBC 8.0  12/15/2015   HGB 12.2 04/16/2016   HCT 36.0 04/16/2016   MCV 90.2 12/15/2015   PLT 316 12/15/2015   No results found for: IRON, TIBC, FERRITIN  Attestation Statements:   Reviewed by clinician on day of visit: allergies, medications, problem list, medical history, surgical history, family history, social history, and previous encounter notes.  I, Jackson Latino, RMA, am acting as Energy manager for [employer], DO.  I have reviewed the above documentation for accuracy and completeness, and I agree with the above. Corinna Capra, DO

## 2021-04-14 ENCOUNTER — Encounter (INDEPENDENT_AMBULATORY_CARE_PROVIDER_SITE_OTHER): Payer: Self-pay | Admitting: Bariatrics

## 2021-05-11 ENCOUNTER — Ambulatory Visit (INDEPENDENT_AMBULATORY_CARE_PROVIDER_SITE_OTHER): Payer: 59 | Admitting: Bariatrics

## 2021-05-11 ENCOUNTER — Encounter (INDEPENDENT_AMBULATORY_CARE_PROVIDER_SITE_OTHER): Payer: Self-pay | Admitting: Bariatrics

## 2021-05-11 ENCOUNTER — Other Ambulatory Visit: Payer: Self-pay

## 2021-05-11 VITALS — BP 139/86 | HR 89 | Temp 97.9°F | Ht 64.0 in | Wt 260.0 lb

## 2021-05-11 DIAGNOSIS — Z6841 Body Mass Index (BMI) 40.0 and over, adult: Secondary | ICD-10-CM | POA: Diagnosis not present

## 2021-05-11 DIAGNOSIS — E8881 Metabolic syndrome: Secondary | ICD-10-CM | POA: Diagnosis not present

## 2021-05-11 DIAGNOSIS — E669 Obesity, unspecified: Secondary | ICD-10-CM

## 2021-05-11 DIAGNOSIS — R632 Polyphagia: Secondary | ICD-10-CM | POA: Diagnosis not present

## 2021-05-11 MED ORDER — PHENTERMINE HCL 37.5 MG PO CAPS: 37.5000 mg | ORAL_CAPSULE | ORAL | 0 refills | Status: DC

## 2021-05-12 ENCOUNTER — Encounter (INDEPENDENT_AMBULATORY_CARE_PROVIDER_SITE_OTHER): Payer: Self-pay | Admitting: Bariatrics

## 2021-05-12 NOTE — Progress Notes (Signed)
? ? ? ?Chief Complaint:  ? ?OBESITY ?Girl is here to discuss her progress with her obesity treatment plan along with follow-up of her obesity related diagnoses. [name] is on the Category 2 Plan and states she is following her eating plan approximately 80-90% of the time. [name] states she is doing 0 minutes 0 times per week. ? ?Today's visit was #: 21 ?Starting weight: 300 lbs ?Starting date: 03/26/2020 ?Today's weight: 260 lbs ?Today's date: 05/11/2021 ?Total lbs lost to date: 40 lbs ?Total lbs lost since last in-office visit: 1 lb ? ?Interim History: [name] is down 1 lb since her last visit. She believes that she is retaining fluid but the bioimpedance scale did not record her water weight at this reading.  ? ?Subjective:  ? ?1. Polyphagia ?Shameeka is currently taking phentermine.  ? ?2. Insulin resistance ?[name] is not on specific medications.  ? ?Assessment/Plan:  ? ?1. Polyphagia ?[name] will keep her water and protein intake levels high. Intensive lifestyle modifications are the first line treatment for this issue. We discussed several lifestyle modifications today and she will continue to work on diet, exercise and weight loss efforts. Orders and follow up as documented in patient record. ? ?Counseling ?Polyphagia is excessive hunger. ?Causes can include: low blood sugars, hypERthyroidism, PMS, lack of sleep, stress, insulin resistance, diabetes, certain medications, and diets that are deficient in protein and fiber.   ? ?2. Insulin resistance ?[name] will minimize carbohydrates (sweets and starches). Handout on Eating Out was provided today. Prepared breakfast options handout was given today She will continue to work on weight loss, exercise, and decreasing simple carbohydrates to help decrease the risk of diabetes. [name] agreed to follow-up with [name] as directed to closely monitor her progress. ? ?3. Obesity with current BMI of 44.7 ?[name] is currently in the action stage of change. As  such, her goal is to continue with weight loss efforts. She has agreed to the Category 2 Plan.  ? ?We will refill phentermine 37.5 mg for 1 month with no refills . ? ?- phentermine 37.5 MG capsule; Take 1 capsule (37.5 mg total) by mouth every morning.  Dispense: 30 capsule; Refill: 0 ? ?Burnett will continue to adhere to the plan 80-90%. She will be mindful eating.  ? ?Exercise goals:  Gentri is on [place] for right knee. For medicine ball. ? ?Behavioral modification strategies: increasing lean protein intake, decreasing simple carbohydrates, increasing vegetables, increasing water intake, decreasing eating out, no skipping meals, meal planning and cooking strategies, keeping healthy foods in the home, and planning for success. ? ?[name] has agreed to follow-up with our clinic in 4 weeks. She was informed of the importance of frequent follow-up visits to maximize her success with intensive lifestyle modifications for her multiple health conditions.  ? ?Objective:  ? ?Blood pressure 139/86, pulse 89, temperature 97.9 ?F (36.6 ?C), height 5\' 4"  (1.626 m), weight 260 lb (117.9 kg), SpO2 100 %. ?Body mass index is 44.63 kg/m?. ?[name]'s ankles are with trace of edema. ? ?General: Cooperative, alert, well developed, in no acute distress. ?HEENT: Conjunctivae and lids unremarkable. ?Cardiovascular: Regular rhythm.  ?Lungs: Normal work of breathing. ?Neurologic: No focal deficits.  ? ?Lab Results  ?Component Value Date  ? CREATININE 0.68 03/23/2021  ? BUN 16 03/23/2021  ? NA 140 03/23/2021  ? K 4.5 03/23/2021  ? CL 105 03/23/2021  ? CO2 23 03/23/2021  ? ?Lab Results  ?Component Value Date  ? ALT 7 03/23/2021  ? AST 11 03/23/2021  ?  ALKPHOS 44 03/23/2021  ? BILITOT 0.2 03/23/2021  ? ?Lab Results  ?Component Value Date  ? HGBA1C 5.4 03/23/2021  ? ?Lab Results  ?Component Value Date  ? INSULIN 16.6 03/23/2021  ? INSULIN 22.2 03/26/2020  ? ?Lab Results  ?Component Value Date  ? TSH 2.060 03/26/2020  ? ?No results found  for: CHOL, HDL, LDLCALC, LDLDIRECT, TRIG, CHOLHDL ?Lab Results  ?Component Value Date  ? VD25OH 29.7 (L) 03/23/2021  ? ?Lab Results  ?Component Value Date  ? WBC 8.0 12/15/2015  ? HGB 12.2 04/16/2016  ? HCT 36.0 04/16/2016  ? MCV 90.2 12/15/2015  ? PLT 316 12/15/2015  ? ?No results found for: IRON, TIBC, FERRITIN ? ?Attestation Statements:  ? ?Reviewed by clinician on day of visit: allergies, medications, problem list, medical history, surgical history, family history, social history, and previous encounter notes. ? ?I, Jackson Latino, RMA, am acting as transcriptionist for [employer], DO. ? ?I have reviewed the above documentation for accuracy and completeness, and I agree with the above. Corinna Capra, DO ? ?

## 2021-06-09 ENCOUNTER — Ambulatory Visit (INDEPENDENT_AMBULATORY_CARE_PROVIDER_SITE_OTHER): Payer: 59 | Admitting: Bariatrics

## 2021-06-09 ENCOUNTER — Encounter (INDEPENDENT_AMBULATORY_CARE_PROVIDER_SITE_OTHER): Payer: Self-pay | Admitting: Bariatrics

## 2021-06-09 VITALS — BP 121/75 | HR 87 | Temp 98.2°F | Ht 64.0 in | Wt 262.0 lb

## 2021-06-09 DIAGNOSIS — F509 Eating disorder, unspecified: Secondary | ICD-10-CM | POA: Insufficient documentation

## 2021-06-09 DIAGNOSIS — R632 Polyphagia: Secondary | ICD-10-CM

## 2021-06-09 DIAGNOSIS — F5089 Other specified eating disorder: Secondary | ICD-10-CM

## 2021-06-09 MED ORDER — PHENTERMINE HCL 37.5 MG PO CAPS: 37.5000 mg | ORAL_CAPSULE | ORAL | 0 refills | Status: DC

## 2021-06-09 MED ORDER — BUPROPION HCL ER (SR) 150 MG PO TB12: 150.0000 mg | ORAL_TABLET | Freq: Every day | ORAL | 0 refills | Status: DC

## 2021-06-10 ENCOUNTER — Encounter: Payer: 59 | Admitting: Obstetrics and Gynecology

## 2021-06-17 NOTE — Progress Notes (Signed)
? ? ? ?Chief Complaint:  ? ?OBESITY ?[name] is here to discuss her progress with her obesity treatment plan along with follow-up of her obesity related diagnoses. [name] is on the Category 2 Plan and states she is following her eating plan approximately 80% of the time. [name] states she is currently not exercising.  ? ?Today's visit was #: 77 ?Starting weight: 262 lbs ?Starting date: 03/26/2020 ?Today's weight: 262 lbs ?Today's date: 05/11/2021 ?Total lbs lost to date: 38 lbs ?Total lbs lost since last in-office visit: 0 lbs ? ?Interim History: [name] is up 2 lbs since her last visit,. She has not been sleeping very well. ? ?Subjective:  ? ?1. Polyphagia ?[name] is currently taking phentermine as directed ? ?2. Other disorder of eating ?[name] is currently taking Wellbutrin 150 mg daily. ? ?Assessment/Plan:  ? ?1. Polyphagia ?Intensive lifestyle modifications are the first line treatment for this issue. We discussed several lifestyle modifications today and she will continue to work on diet, exercise and weight loss efforts. Orders and follow up as documented in patient record. ? ?Counseling ?Polyphagia is excessive hunger. ?Causes can include: low blood sugars, hypERthyroidism, PMS, lack of sleep, stress, insulin resistance, diabetes, certain medications, and diets that are deficient in protein and fiber.  ? ?- phentermine 37.5 MG capsule; Take 1 capsule (37.5 mg total) by mouth every morning.  Dispense: 30 capsule; Refill: 0 ? ?2. Other disorder of eating ?Will refill Wellbutrin ?- buPROPion (WELLBUTRIN SR) 150 MG 12 hr tablet; Take 1 tablet (150 mg total) by mouth daily.  Dispense: 30 tablet; Refill: 0 ? ?[name] is currently in the action stage of change. As such, her goal is to continue with weight loss efforts. She has agreed to the Category 2 Plan.  ? ?[name] agreed to meal planning and intentional eating. ?Will refill Phentermine 37.5 mg daily ? ?Exercise goals: As is ? ?Behavioral modification  strategies: increasing lean protein intake, decreasing simple carbohydrates, increasing vegetables, increasing water intake, decreasing eating out, no skipping meals, meal planning and cooking strategies, keeping healthy foods in the home, and planning for success. ? ?[name] has agreed to follow-up with our clinic in 4 weeks. She was informed of the importance of frequent follow-up visits to maximize her success with intensive lifestyle modifications for her multiple health conditions.  ? ? ?Objective:  ? ?Blood pressure 121/75, pulse 87, temperature 98.2 ?F (36.8 ?C), height 5\' 4"  (1.626 m), weight 262 lb (118.8 kg), SpO2 96 %. ?Body mass index is 44.97 kg/m?. ? ?General: Cooperative, alert, well developed, in no acute distress. ?HEENT: Conjunctivae and lids unremarkable. ?Cardiovascular: Regular rhythm.  ?Lungs: Normal work of breathing. ?Neurologic: No focal deficits.  ? ?Lab Results  ?Component Value Date  ? CREATININE 0.68 03/23/2021  ? BUN 16 03/23/2021  ? NA 140 03/23/2021  ? K 4.5 03/23/2021  ? CL 105 03/23/2021  ? CO2 23 03/23/2021  ? ?Lab Results  ?Component Value Date  ? ALT 7 03/23/2021  ? AST 11 03/23/2021  ? ALKPHOS 44 03/23/2021  ? BILITOT 0.2 03/23/2021  ? ?Lab Results  ?Component Value Date  ? HGBA1C 5.4 03/23/2021  ? ?Lab Results  ?Component Value Date  ? INSULIN 16.6 03/23/2021  ? INSULIN 22.2 03/26/2020  ? ?Lab Results  ?Component Value Date  ? TSH 2.060 03/26/2020  ? ?No results found for: CHOL, HDL, LDLCALC, LDLDIRECT, TRIG, CHOLHDL ?Lab Results  ?Component Value Date  ? VD25OH 29.7 (L) 03/23/2021  ? ?Lab Results  ?Component Value Date  ?  WBC 8.0 12/15/2015  ? HGB 12.2 04/16/2016  ? HCT 36.0 04/16/2016  ? MCV 90.2 12/15/2015  ? PLT 316 12/15/2015  ? ?No results found for: IRON, TIBC, FERRITIN ? ?Attestation Statements:  ? ?Reviewed by clinician on day of visit: allergies, medications, problem list, medical history, surgical history, family history, social history, and previous encounter  notes. ? ?I, Lennette Bihari, CMA, am acting as transcriptionist for Dr. Jearld Lesch, DO.  ? ?I have reviewed the above documentation for accuracy and completeness, and I agree with the above. Jearld Lesch, DO ? ?

## 2021-06-18 ENCOUNTER — Encounter (INDEPENDENT_AMBULATORY_CARE_PROVIDER_SITE_OTHER): Payer: Self-pay | Admitting: Bariatrics

## 2021-07-20 ENCOUNTER — Ambulatory Visit (INDEPENDENT_AMBULATORY_CARE_PROVIDER_SITE_OTHER): Payer: 59 | Admitting: Bariatrics

## 2021-07-20 ENCOUNTER — Encounter (INDEPENDENT_AMBULATORY_CARE_PROVIDER_SITE_OTHER): Payer: Self-pay | Admitting: Bariatrics

## 2021-07-20 VITALS — BP 133/86 | HR 93 | Temp 98.0°F | Ht 64.0 in | Wt 264.0 lb

## 2021-07-20 DIAGNOSIS — E669 Obesity, unspecified: Secondary | ICD-10-CM

## 2021-07-20 DIAGNOSIS — F5089 Other specified eating disorder: Secondary | ICD-10-CM | POA: Diagnosis not present

## 2021-07-20 DIAGNOSIS — R632 Polyphagia: Secondary | ICD-10-CM

## 2021-07-20 DIAGNOSIS — Z6841 Body Mass Index (BMI) 40.0 and over, adult: Secondary | ICD-10-CM

## 2021-07-20 MED ORDER — PHENTERMINE HCL 15 MG PO CAPS: 15.0000 mg | ORAL_CAPSULE | Freq: Two times a day (BID) | ORAL | 0 refills | Status: DC

## 2021-07-21 ENCOUNTER — Encounter: Payer: Self-pay | Admitting: Obstetrics and Gynecology

## 2021-07-21 ENCOUNTER — Ambulatory Visit: Payer: 59 | Admitting: Obstetrics and Gynecology

## 2021-07-21 ENCOUNTER — Other Ambulatory Visit (HOSPITAL_COMMUNITY)
Admission: RE | Admit: 2021-07-21 | Discharge: 2021-07-21 | Disposition: A | Discharge status: Home / Self Care | Payer: 59 | Source: Ambulatory Visit | Attending: Obstetrics and Gynecology | Admitting: Obstetrics and Gynecology

## 2021-07-21 DIAGNOSIS — Z01419 Encounter for gynecological examination (general) (routine) without abnormal findings: Secondary | ICD-10-CM | POA: Insufficient documentation

## 2021-07-21 DIAGNOSIS — Z1231 Encounter for screening mammogram for malignant neoplasm of breast: Secondary | ICD-10-CM | POA: Diagnosis not present

## 2021-07-21 DIAGNOSIS — Z202 Contact with and (suspected) exposure to infections with a predominantly sexual mode of transmission: Secondary | ICD-10-CM

## 2021-07-21 DIAGNOSIS — N938 Other specified abnormal uterine and vaginal bleeding: Secondary | ICD-10-CM

## 2021-07-21 DIAGNOSIS — Z9079 Acquired absence of other genital organ(s): Secondary | ICD-10-CM | POA: Insufficient documentation

## 2021-07-21 NOTE — Progress Notes (Signed)
41 y.o New GYN presents for AEX/PAP.

## 2021-07-21 NOTE — Progress Notes (Signed)
Chief Complaint:   OBESITY [name] is here to discuss her progress with her obesity treatment plan along with follow-up of her obesity related diagnoses. [name] is on the Category 2 Plan and states she is following her eating plan approximately 0% of the time. [name] states she is doing 0 minutes 0 times per week.  Today's visit was #: 23 Starting weight: 262 lbs Starting date: 03/26/2020 Today's weight: 264 lbs Today's date: 07/20/2021 Total lbs lost to date: 0 Total lbs lost since last in-office visit: 0  Interim History: [name] is up 2 lbs since her last visit. She had surgery in the interim (right knee) and still having physical therapy. She is more hungry in the afternoon.   Subjective:   1. Polyphagia [name] is taking phentermine.which has been helpful. She will also increase her protein and water intake.   2. Other disorder of eating [name] is taking Wellbutrin currently.   Assessment/Plan:   1. Polyphagia [name] will increase protein and healthy fats. She will resume exercise.  She will continue phentermine but will change dose to phentermine 15 mg for 1 month with no refills. Intensive lifestyle modifications are the first line treatment for this issue. We discussed several lifestyle modifications today and she will continue to work on diet, exercise and weight loss efforts. Orders and follow up as documented in patient record.  Counseling Polyphagia is excessive hunger. Causes can include: low blood sugars, hypERthyroidism, PMS, lack of sleep, stress, insulin resistance, diabetes, certain medications, and diets that are deficient in protein and fiber.    - phentermine 15 MG capsule; Take 1 capsule (15 mg total) by mouth 2 (two) times daily.  Dispense: 60 capsule; Refill: 0  2. Other disorder of eating [name] will continue Wellbutrin. Behavior modification techniques were discussed today to help [name] deal with her emotional/non-hunger eating  behaviors.  Orders and follow up as documented in patient record.   3. Obesity, Current BMI 45.5 [name] is currently in the action stage of change. As such, her goal is to continue with weight loss efforts. She has agreed to the Category 2 Plan.   [name] will adhere closely to the plan 80-90%. She will be mindful eating.   Exercise goals: No exercise has been prescribed at this time.  Behavioral modification strategies: increasing lean protein intake, decreasing simple carbohydrates, increasing vegetables, increasing water intake, decreasing eating out, no skipping meals, meal planning and cooking strategies, keeping healthy foods in the home, and planning for success.  [name] has agreed to follow-up with our clinic in 4-5 weeks. She was informed of the importance of frequent follow-up visits to maximize her success with intensive lifestyle modifications for her multiple health conditions.   Objective:   Blood pressure 133/86, pulse 93, temperature 98 F (36.7 C), height 5\' 4"  (1.626 m), weight 264 lb (119.7 kg), SpO2 97 %. Body mass index is 45.32 kg/m.  General: Cooperative, alert, well developed, in no acute distress. HEENT: Conjunctivae and lids unremarkable. Cardiovascular: Regular rhythm.  Lungs: Normal work of breathing. Neurologic: No focal deficits.   Lab Results  Component Value Date   CREATININE 0.68 03/23/2021   BUN 16 03/23/2021   NA 140 03/23/2021   K 4.5 03/23/2021   CL 105 03/23/2021   CO2 23 03/23/2021   Lab Results  Component Value Date   ALT 7 03/23/2021   AST 11 03/23/2021   ALKPHOS 44 03/23/2021   BILITOT 0.2 03/23/2021   Lab Results  Component Value Date  HGBA1C 5.4 03/23/2021   Lab Results  Component Value Date   INSULIN 16.6 03/23/2021   INSULIN 22.2 03/26/2020   Lab Results  Component Value Date   TSH 2.060 03/26/2020   No results found for: CHOL, HDL, LDLCALC, LDLDIRECT, TRIG, CHOLHDL Lab Results  Component Value Date   VD25OH  29.7 (L) 03/23/2021   Lab Results  Component Value Date   WBC 8.0 12/15/2015   HGB 12.2 04/16/2016   HCT 36.0 04/16/2016   MCV 90.2 12/15/2015   PLT 316 12/15/2015   No results found for: IRON, TIBC, FERRITIN  Attestation Statements:   Reviewed by clinician on day of visit: allergies, medications, problem list, medical history, surgical history, family history, social history, and previous encounter notes.  I, Lizbeth Bark, RMA, am acting as Location manager for [employer], DO.  I have reviewed the above documentation for accuracy and completeness, and I agree with the above. Jearld Lesch, DO

## 2021-07-21 NOTE — Progress Notes (Signed)
[name] is a 41 y.o. G51P3003 female here for a routine annual gynecologic exam.  Current complaints: Some irregular cycles over the last 4 months.   Denies discharge, pelvic pain, problems with intercourse or other gynecologic concerns.    Sexual active without problems  Gynecologic History No LMP recorded. Contraception: tubal ligation Last Pap: 2017. Results were: normal Last mammogram: NA.   Obstetric History OB History  Gravida Para Term Preterm AB Living  3 3 3  0 0 3  SAB IAB Ectopic Multiple Live Births  0 0 0 0 3    # Outcome Date GA Lbr Len/2nd Weight Sex Delivery Anes PTL Lv  3 Term           2 Term           1 Term             Past Medical History:  Diagnosis Date   Anemia    Anxiety    Back pain    Drug use    Edema of both lower extremities    Gallbladder problem    Headache    Migraines   History of kidney stones    IBS (irritable bowel syndrome)    Joint pain    Kidney stones    PCOS (polycystic ovarian syndrome)    PONV (postoperative nausea and vomiting)    Renal calculus or stone 04/01/2020   Sleep apnea    Vitamin D deficiency     Past Surgical History:  Procedure Laterality Date   CHOLECYSTECTOMY     IUD REMOVAL N/A 12/25/2015   Procedure: INTRAUTERINE DEVICE (IUD) REMOVAL;  Surgeon: 13/10/2015, MD;  Location: [facility];  Service: Gynecology;  Laterality: N/A;   LAPAROSCOPIC BILATERAL SALPINGECTOMY Bilateral 12/25/2015   Procedure: LAPAROSCOPIC BILATERAL SALPINGECTOMY;  Surgeon: 13/10/2015, MD;  Location: [facility];  Service: Gynecology;  Laterality: Bilateral;   LIPOMA EXCISION     LITHOTRIPSY     URETEROSCOPY      Current Outpatient Medications on File Prior to Visit  Medication Sig Dispense Refill   buPROPion (WELLBUTRIN SR) 150 MG 12 hr tablet Take 1 tablet (150 mg total) by mouth daily. 30 tablet 0   cholecalciferol (VITAMIN D) 1000 units tablet Take 1,000 Units by mouth daily.     furosemide (LASIX) 20 MG tablet Take 20 mg  by mouth.     meloxicam (MOBIC) 15 MG tablet Take 15 mg by mouth daily.     phentermine 15 MG capsule Take 1 capsule (15 mg total) by mouth 2 (two) times daily. 60 capsule 0   traZODone (DESYREL) 50 MG tablet Take 50 mg by mouth at bedtime.     No current facility-administered medications on file prior to visit.    Allergies  Allergen Reactions   Morphine And Related Nausea And Vomiting   Sulfa Antibiotics Rash and Other (See Comments)    Dizzy and lightheaded     Social History   Socioeconomic History   Marital status: Married    Spouse name: Not on file   Number of children: Not on file   Years of education: Not on file   Highest education level: Not on file  Occupational History   Occupation: RN  Tobacco Use   Smoking status: Former   Smokeless tobacco: Current  Substance and Sexual Activity   Alcohol use: No   Drug use: No   Sexual activity: Yes    Partners: Male    Birth  control/protection: Surgical  Other Topics Concern   Not on file  Social History Narrative   Not on file   Social Determinants of Health   Financial Resource Strain: Not on file  Food Insecurity: Not on file  Transportation Needs: Not on file  Physical Activity: Not on file  Stress: Not on file  Social Connections: Not on file  Intimate Partner Violence: Not on file    Family History  Problem Relation Age of Onset   High blood pressure Mother    High Cholesterol Mother    Heart disease Mother    Thyroid disease Mother    Depression Mother    Anxiety disorder Mother    Sleep apnea Mother    Alcoholism Mother    Drug abuse Mother    Obesity Mother    High blood pressure Father    High Cholesterol Father    Heart disease Father    Sudden death Father    Liver disease Father    Alcoholism Father    Drug abuse Father    Breast cancer Neg Hx     The following portions of the patient's history were reviewed and updated as appropriate: allergies, current medications, past family  history, past medical history, past social history, past surgical history and problem list.  Review of Systems Pertinent items are noted in HPI.   Objective:  BP 108/74   Pulse 85   Ht 5\' 4"  (1.626 m)   Wt 269 lb (122 kg)   BMI 46.17 kg/m  CONSTITUTIONAL: Well-developed, well-nourished female in no acute distress.  HENT:  Normocephalic, atraumatic, External right and left ear normal. Oropharynx is clear and moist EYES: Conjunctivae and EOM are normal. Pupils are equal, round, and reactive to light. No scleral icterus.  NECK: Normal range of motion, supple, no masses.  Normal thyroid.  SKIN: Skin is warm and dry. No rash noted. Not diaphoretic. No erythema. No pallor. NEUROLGIC: Alert and oriented to person, place, and time. Normal reflexes, muscle tone coordination. No cranial nerve deficit noted. PSYCHIATRIC: Normal mood and affect. Normal behavior. Normal judgment and thought content. CARDIOVASCULAR: Normal heart rate noted, regular rhythm RESPIRATORY: Clear to auscultation bilaterally. Effort and breath sounds normal, no problems with respiration noted. BREASTS: Symmetric in size. No masses, skin changes, nipple drainage, or lymphadenopathy. ABDOMEN: Soft, normal bowel sounds, no distention noted.  No tenderness, rebound or guarding.  PELVIC: Normal appearing external genitalia; normal appearing vaginal mucosa and cervix.  No abnormal discharge noted.  Pap smear obtained.  Normal uterine size, no other palpable masses, no uterine or adnexal tenderness. Small rectocele noted MUSCULOSKELETAL: Normal range of motion. No tenderness.  No cyanosis, clubbing, or edema.  2+ distal pulses.   Assessment:  Annual gynecologic examination with pap smear Screening mammogram STD exposure DUB Plan:  Will follow up results of pap smear and manage accordingly. Mammogram scheduled. STD testing. Check GYN U/S.  Routine preventative health maintenance measures emphasized. Please refer to After  Visit Summary for other counseling recommendations.    , MD, FACOG Attending Obstetrician & Gynecologist Center for [facility], [facility]

## 2021-07-21 NOTE — Patient Instructions (Signed)

## 2021-07-22 LAB — HEPATITIS C ANTIBODY: Hep C Virus Ab: NONREACTIVE

## 2021-07-22 LAB — HIV ANTIBODY (ROUTINE TESTING W REFLEX): HIV Screen 4th Generation wRfx: NONREACTIVE

## 2021-07-22 LAB — RPR: RPR Ser Ql: NONREACTIVE

## 2021-07-24 LAB — CYTOLOGY - PAP
Chlamydia: NEGATIVE
Comment: NEGATIVE
Comment: NEGATIVE
Comment: NEGATIVE
Comment: NEGATIVE
Comment: NEGATIVE
Comment: NORMAL
Diagnosis: HIGH — AB
HPV 16: POSITIVE — AB
HPV 18 / 45: NEGATIVE
High risk HPV: POSITIVE — AB
Neisseria Gonorrhea: NEGATIVE
Trichomonas: NEGATIVE

## 2021-07-27 ENCOUNTER — Encounter: Payer: Self-pay | Admitting: Obstetrics and Gynecology

## 2021-07-27 ENCOUNTER — Encounter (INDEPENDENT_AMBULATORY_CARE_PROVIDER_SITE_OTHER): Payer: Self-pay | Admitting: Bariatrics

## 2021-07-27 DIAGNOSIS — R87620 Atypical squamous cells of undetermined significance on cytologic smear of vagina (ASC-US): Secondary | ICD-10-CM | POA: Insufficient documentation

## 2021-07-30 ENCOUNTER — Ambulatory Visit
Admission: RE | Admit: 2021-07-30 | Discharge: 2021-07-30 | Disposition: A | Discharge status: Home / Self Care | Payer: 59 | Source: Ambulatory Visit | Attending: Obstetrics and Gynecology | Admitting: Obstetrics and Gynecology

## 2021-07-30 DIAGNOSIS — N938 Other specified abnormal uterine and vaginal bleeding: Secondary | ICD-10-CM

## 2021-08-03 ENCOUNTER — Encounter: Payer: Self-pay | Admitting: Obstetrics and Gynecology

## 2021-08-17 ENCOUNTER — Ambulatory Visit (INDEPENDENT_AMBULATORY_CARE_PROVIDER_SITE_OTHER): Payer: 59 | Admitting: Bariatrics

## 2021-08-17 ENCOUNTER — Encounter (INDEPENDENT_AMBULATORY_CARE_PROVIDER_SITE_OTHER): Payer: Self-pay | Admitting: Bariatrics

## 2021-08-17 VITALS — BP 119/80 | HR 84 | Temp 97.8°F | Ht 64.0 in | Wt 263.0 lb

## 2021-08-17 DIAGNOSIS — R632 Polyphagia: Secondary | ICD-10-CM

## 2021-08-17 DIAGNOSIS — F5089 Other specified eating disorder: Secondary | ICD-10-CM | POA: Diagnosis not present

## 2021-08-17 DIAGNOSIS — E669 Obesity, unspecified: Secondary | ICD-10-CM | POA: Diagnosis not present

## 2021-08-17 DIAGNOSIS — Z6841 Body Mass Index (BMI) 40.0 and over, adult: Secondary | ICD-10-CM | POA: Diagnosis not present

## 2021-08-17 MED ORDER — BUPROPION HCL ER (SR) 150 MG PO TB12: 150.0000 mg | ORAL_TABLET | Freq: Every day | ORAL | 0 refills | Status: DC

## 2021-08-17 MED ORDER — PHENTERMINE HCL 15 MG PO CAPS: 15.0000 mg | ORAL_CAPSULE | Freq: Two times a day (BID) | ORAL | 0 refills | Status: DC

## 2021-08-18 NOTE — Progress Notes (Unsigned)
Chief Complaint:   OBESITY [name] is here to discuss her progress with her obesity treatment plan along with follow-up of her obesity related diagnoses. [name] is on the Category 2 Plan and states she is following her eating plan approximately 0% of the time. [name] states she is doing 0 minutes 0 times per week.  Today's visit was #: 24 Starting weight: 262 lbs Starting date: 03/26/2020 Today's weight: 263 lbs Today's date: 08/17/2021 Total lbs lost to date: 0 Total lbs lost since last in-office visit: 1  Interim History: Tamaiya's weight is down 1 pound since her last visit.  She states that her knee has been hurting and that she has not done anything.  Subjective:   1. Polyphagia [name] is taking phentermine, and she notes it helps with her appetite.  2. Other disorder of eating [name] is taking Wellbutrin for stress eating.  Assessment/Plan:   1. Polyphagia [name] will continue phentermine 15 mg twice daily, and we will refill for 1 month.  - phentermine 15 MG capsule; Take 1 capsule (15 mg total) by mouth 2 (two) times daily.  Dispense: 60 capsule; Refill: 0  2. Other disorder of eating [name] will continue Wellbutrin SR 150 mg daily, and we will refill for 1 month.  - buPROPion (WELLBUTRIN SR) 150 MG 12 hr tablet; Take 1 tablet (150 mg total) by mouth daily.  Dispense: 30 tablet; Refill: 0  3. Obesity, Current BMI 45.2 [name] is currently in the action stage of change. As such, her goal is to continue with weight loss efforts. She has agreed to the Category 2 Plan.   [name] will adhere more closely with the plan.  She will eat her protein first.    We discussed various medication options to help [name] with her weight loss efforts and we both agreed to continue phentermine 15 mg twice daily, and we will refill for 1 month.  - phentermine 15 MG capsule; Take 1 capsule (15 mg total) by mouth 2 (two) times daily.  Dispense: 60 capsule; Refill:  0  Exercise goals: No exercise has been prescribed at this time.  Behavioral modification strategies: increasing lean protein intake, decreasing simple carbohydrates, increasing vegetables, increasing water intake, decreasing eating out, no skipping meals, meal planning and cooking strategies, keeping healthy foods in the home, and planning for success.  [name] has agreed to follow-up with our clinic in 4 weeks. She was informed of the importance of frequent follow-up visits to maximize her success with intensive lifestyle modifications for her multiple health conditions.   Objective:   Blood pressure 119/80, pulse 84, temperature 97.8 F (36.6 C), height 5\' 4"  (1.626 m), weight 263 lb (119.3 kg), SpO2 99 %. Body mass index is 45.14 kg/m.  General: Cooperative, alert, well developed, in no acute distress. HEENT: Conjunctivae and lids unremarkable. Cardiovascular: Regular rhythm.  Lungs: Normal work of breathing. Neurologic: No focal deficits.   Lab Results  Component Value Date   CREATININE 0.68 03/23/2021   BUN 16 03/23/2021   NA 140 03/23/2021   K 4.5 03/23/2021   CL 105 03/23/2021   CO2 23 03/23/2021   Lab Results  Component Value Date   ALT 7 03/23/2021   AST 11 03/23/2021   ALKPHOS 44 03/23/2021   BILITOT 0.2 03/23/2021   Lab Results  Component Value Date   HGBA1C 5.4 03/23/2021   Lab Results  Component Value Date   INSULIN 16.6 03/23/2021   INSULIN 22.2 03/26/2020   Lab Results  Component Value Date   TSH 2.060 03/26/2020   No results found for: "CHOL", "HDL", "LDLCALC", "LDLDIRECT", "TRIG", "CHOLHDL" Lab Results  Component Value Date   VD25OH 29.7 (L) 03/23/2021   Lab Results  Component Value Date   WBC 8.0 12/15/2015   HGB 12.2 04/16/2016   HCT 36.0 04/16/2016   MCV 90.2 12/15/2015   PLT 316 12/15/2015   No results found for: "IRON", "TIBC", "FERRITIN"  Attestation Statements:   Reviewed by clinician on day of visit: allergies, medications,  problem list, medical history, surgical history, family history, social history, and previous encounter notes.   Trude Mcburney, am acting as Energy manager for [employer], DO.  I have reviewed the above documentation for accuracy and completeness, and I agree with the above. Corinna Capra, DO

## 2021-08-19 ENCOUNTER — Encounter (INDEPENDENT_AMBULATORY_CARE_PROVIDER_SITE_OTHER): Payer: Self-pay | Admitting: Bariatrics

## 2021-08-20 ENCOUNTER — Encounter (INDEPENDENT_AMBULATORY_CARE_PROVIDER_SITE_OTHER): Payer: Self-pay | Admitting: Bariatrics

## 2021-09-07 ENCOUNTER — Encounter: Payer: Self-pay | Admitting: Obstetrics and Gynecology

## 2021-09-08 ENCOUNTER — Other Ambulatory Visit (INDEPENDENT_AMBULATORY_CARE_PROVIDER_SITE_OTHER): Payer: Self-pay | Admitting: Bariatrics

## 2021-09-08 DIAGNOSIS — F5089 Other specified eating disorder: Secondary | ICD-10-CM

## 2021-09-14 ENCOUNTER — Encounter (INDEPENDENT_AMBULATORY_CARE_PROVIDER_SITE_OTHER): Payer: Self-pay | Admitting: Bariatrics

## 2021-09-14 ENCOUNTER — Ambulatory Visit (INDEPENDENT_AMBULATORY_CARE_PROVIDER_SITE_OTHER): Payer: 59 | Admitting: Bariatrics

## 2021-09-14 VITALS — BP 114/73 | HR 95 | Temp 97.6°F | Ht 64.0 in | Wt 264.0 lb

## 2021-09-14 DIAGNOSIS — F5089 Other specified eating disorder: Secondary | ICD-10-CM

## 2021-09-14 DIAGNOSIS — E669 Obesity, unspecified: Secondary | ICD-10-CM | POA: Diagnosis not present

## 2021-09-14 DIAGNOSIS — Z6841 Body Mass Index (BMI) 40.0 and over, adult: Secondary | ICD-10-CM

## 2021-09-14 DIAGNOSIS — R632 Polyphagia: Secondary | ICD-10-CM | POA: Diagnosis not present

## 2021-09-14 MED ORDER — WEGOVY 0.25 MG/0.5ML ~~LOC~~ SOAJ: 0.2500 mg | SUBCUTANEOUS | 0 refills | Status: DC

## 2021-09-14 MED ORDER — PHENTERMINE HCL 15 MG PO CAPS: 15.0000 mg | ORAL_CAPSULE | Freq: Two times a day (BID) | ORAL | 0 refills | Status: DC

## 2021-09-21 ENCOUNTER — Other Ambulatory Visit: Payer: Self-pay | Admitting: Obstetrics and Gynecology

## 2021-09-21 DIAGNOSIS — N83209 Unspecified ovarian cyst, unspecified side: Secondary | ICD-10-CM

## 2021-09-23 ENCOUNTER — Encounter (INDEPENDENT_AMBULATORY_CARE_PROVIDER_SITE_OTHER): Payer: Self-pay | Admitting: Bariatrics

## 2021-09-23 ENCOUNTER — Encounter (INDEPENDENT_AMBULATORY_CARE_PROVIDER_SITE_OTHER): Payer: Self-pay

## 2021-09-23 NOTE — Progress Notes (Signed)
Chief Complaint:   OBESITY Quanta is here to discuss her progress with her obesity treatment plan along with follow-up of her obesity related diagnoses. Ruhama is on the Category 2 Plan and states she is following her eating plan approximately 75% of the time. Tenzin states she is weight lifting for 15 minutes 5 times per week.  Today's visit was #: 25 Starting weight: 262 lbs Starting date: 03/26/2020 Today's weight: 264 lbs Today's date: 09/14/2021 Total lbs lost to date: 0 Total lbs lost since last in-office visit: 0  Interim History: Kimble is up 1 lb since her last visit. She has been on vacation since her last visit.  Subjective:   1. Polyphagia Naavya is is currently taking Wellbutrin and phentermine to help with her appetite.   2. Other disorder of eating Aleya is currently taking Wellbutrin, and she notes she is having more cravings at night and has sexual side effects.   Assessment/Plan:   1. Polyphagia Medina will continue phentermine 15 mg BID, and we will refill for 1 month; she agreed to start Wegovy 0.25 mg once weekly with no refills (prior authorization needed).   - Semaglutide-Weight Management (WEGOVY) 0.25 MG/0.5ML SOAJ; Inject 0.25 mg into the skin once a week.  Dispense: 2 mL; Refill: 0 - phentermine 15 MG capsule; Take 1 capsule (15 mg total) by mouth 2 (two) times daily.  Dispense: 60 capsule; Refill: 0  2. Other disorder of eating Caralynn agreed to discontinue Wellbutrin, and we will follow-up at her next visit.  3. Obesity, Current BMI 45.3 Miasha is currently in the action stage of change. As such, her goal is to continue with weight loss efforts. She has agreed to the Category 3 Plan and keeping a food journal and adhering to recommended goals of 1500 calories and 90+ grams of protein daily.   Meal planning was discussed. She will adhere closely to the plan.   We discussed various medication options to help Kortni with her  weight loss efforts and we both agreed to continue phentermine 15 mg BID, and we will refill for 1 month.  - phentermine 15 MG capsule; Take 1 capsule (15 mg total) by mouth 2 (two) times daily.  Dispense: 60 capsule; Refill: 0  Exercise goals: As is.   Behavioral modification strategies: increasing lean protein intake, decreasing simple carbohydrates, increasing vegetables, increasing water intake, decreasing eating out, no skipping meals, meal planning and cooking strategies, keeping healthy foods in the home, and planning for success.  Taliana has agreed to follow-up with our clinic in 4 weeks. She was informed of the importance of frequent follow-up visits to maximize her success with intensive lifestyle modifications for her multiple health conditions.   Objective:   Blood pressure 114/73, pulse 95, temperature 97.6 F (36.4 C), height 5\' 4"  (1.626 m), weight 264 lb (119.7 kg), SpO2 97 %. Body mass index is 45.32 kg/m.  General: Cooperative, alert, well developed, in no acute distress. HEENT: Conjunctivae and lids unremarkable. Cardiovascular: Regular rhythm.  Lungs: Normal work of breathing. Neurologic: No focal deficits.   Lab Results  Component Value Date   CREATININE 0.68 03/23/2021   BUN 16 03/23/2021   NA 140 03/23/2021   K 4.5 03/23/2021   CL 105 03/23/2021   CO2 23 03/23/2021   Lab Results  Component Value Date   ALT 7 03/23/2021   AST 11 03/23/2021   ALKPHOS 44 03/23/2021   BILITOT 0.2 03/23/2021   Lab Results  Component Value  Date   HGBA1C 5.4 03/23/2021   Lab Results  Component Value Date   INSULIN 16.6 03/23/2021   INSULIN 22.2 03/26/2020   Lab Results  Component Value Date   TSH 2.060 03/26/2020   No results found for: "CHOL", "HDL", "LDLCALC", "LDLDIRECT", "TRIG", "CHOLHDL" Lab Results  Component Value Date   VD25OH 29.7 (L) 03/23/2021   Lab Results  Component Value Date   WBC 8.0 12/15/2015   HGB 12.2 04/16/2016   HCT 36.0 04/16/2016    MCV 90.2 12/15/2015   PLT 316 12/15/2015   No results found for: "IRON", "TIBC", "FERRITIN"  Attestation Statements:   Reviewed by clinician on day of visit: allergies, medications, problem list, medical history, surgical history, family history, social history, and previous encounter notes.   Trude Mcburney, am acting as Energy manager for [employer], DO.  I have reviewed the above documentation for accuracy and completeness, and I agree with the above. Corinna Capra, DO

## 2021-09-29 ENCOUNTER — Ambulatory Visit (INDEPENDENT_AMBULATORY_CARE_PROVIDER_SITE_OTHER): Payer: 59 | Admitting: Obstetrics and Gynecology

## 2021-09-29 ENCOUNTER — Encounter: Payer: Self-pay | Admitting: Obstetrics and Gynecology

## 2021-09-29 ENCOUNTER — Other Ambulatory Visit (HOSPITAL_COMMUNITY)
Admission: RE | Admit: 2021-09-29 | Discharge: 2021-09-29 | Disposition: A | Discharge status: Home / Self Care | Payer: 59 | Source: Ambulatory Visit | Attending: Obstetrics and Gynecology | Admitting: Obstetrics and Gynecology

## 2021-09-29 VITALS — BP 136/88 | HR 82 | Ht 65.0 in | Wt 271.1 lb

## 2021-09-29 DIAGNOSIS — N83209 Unspecified ovarian cyst, unspecified side: Secondary | ICD-10-CM | POA: Insufficient documentation

## 2021-09-29 DIAGNOSIS — R87620 Atypical squamous cells of undetermined significance on cytologic smear of vagina (ASC-US): Secondary | ICD-10-CM

## 2021-09-29 DIAGNOSIS — R87810 Cervical high risk human papillomavirus (HPV) DNA test positive: Secondary | ICD-10-CM

## 2021-09-29 DIAGNOSIS — R87811 Vaginal high risk human papillomavirus (HPV) DNA test positive: Secondary | ICD-10-CM | POA: Insufficient documentation

## 2021-09-29 DIAGNOSIS — N938 Other specified abnormal uterine and vaginal bleeding: Secondary | ICD-10-CM

## 2021-09-29 NOTE — Progress Notes (Signed)
    GYNECOLOGY CLINIC COLPOSCOPY PROCEDURE NOTE  41 y.o. [id number] here for colposcopy for ASCUS with POSITIVE high risk HPV pap smear on 6/23. Discussed role for HPV in cervical dysplasia, need for surveillance.  Patient given informed consent, signed copy in the chart, time out was performed.  Placed in lithotomy position. Cervix viewed with speculum and colposcope after application of acetic acid.   Colposcopy adequate? Yes  acetowhite lesion(s) noted at 12 & 6 o'clock; corresponding biopsies obtained.  ECC specimen obtained. Monsel's applied All specimens were labelled and sent to pathology.  Patient was given post procedure instructions.  Will follow up pathology and manage accordingly.  Routine preventative health maintenance measures emphasized.  GYN U/S for f/u ovarian cyst  Nettie Elm, MD, FACOG Attending Obstetrician & Gynecologist Center for [facility], [facility]

## 2021-09-29 NOTE — Patient Instructions (Signed)
Colposcopy, Care After  The following information offers guidance on how to care for yourself after your procedure. Your doctor may also give you more specific instructions. If you have problems or questions, contact your doctor. What can I expect after the procedure? If you did not have a sample of your tissue taken out (did not have a biopsy), you may only have some spotting of blood for a few days. You can go back to your normal activities. If you had a sample of your tissue taken out, it is common to have: Soreness and mild pain. These may last for a few days. Mild bleeding or fluid (discharge) coming from your vagina. The fluid will look dark and grainy. You may have this for a few days. The fluid may be caused by a liquid that was used during your procedure. You may need to wear a sanitary pad. Spotting of blood for at least 48 hours after the procedure. Follow these instructions at home: Medicines Take over-the-counter and prescription medicines only as told by your doctor. Ask your doctor what over-the-counter pain medicines and prescription medicines you can start taking again. This is very important if you take blood thinners. Activity For at least 3 days, or for as long as told by your doctor, avoid: Douching. Using tampons. Having sex. Return to your normal activities as told by your doctor. Ask your doctor what activities are safe for you. General instructions Ask your doctor if you may take baths, swim, or use a hot tub. You may take showers. If you use birth control (contraception), keep using it. Keep all follow-up visits. Contact a doctor if: You have a fever or chills. You faint or feel light-headed. Get help right away if: You bleed a lot from your vagina. A lot of bleeding means that the bleeding soaks through a pad in less than 1 hour. You have clumps of blood (blood clots) coming from your vagina. You have signs that could mean you have an infection. This may be  fluid coming from your vagina that is: Different than normal. Yellow. Bad-smelling. You have very bad pain or cramps in your lower belly that do not get better with medicine. Summary If you did not have a sample of your tissue taken out, you may only have some spotting of blood for a few days. You can go back to your normal activities. If you had a sample of your tissue taken out, it is common to have mild pain for a few days and spotting for 48 hours. Avoid douching, using tampons, and having sex for at least 3 days after the procedure or for as long as told. Get help right away if you have a lot of bleeding, very bad pain, or signs of infection. This information is not intended to replace advice given to you by your health care provider. Make sure you discuss any questions you have with your health care provider. Document Revised: 06/29/2020 Document Reviewed: 06/29/2020 Elsevier Patient Education  2023 Elsevier Inc.  

## 2021-10-01 ENCOUNTER — Ambulatory Visit
Admission: RE | Admit: 2021-10-01 | Discharge: 2021-10-01 | Disposition: A | Discharge status: Home / Self Care | Payer: 59 | Source: Ambulatory Visit | Attending: Obstetrics and Gynecology | Admitting: Obstetrics and Gynecology

## 2021-10-01 DIAGNOSIS — Z1231 Encounter for screening mammogram for malignant neoplasm of breast: Secondary | ICD-10-CM

## 2021-10-01 DIAGNOSIS — N83209 Unspecified ovarian cyst, unspecified side: Secondary | ICD-10-CM

## 2021-10-01 LAB — SURGICAL PATHOLOGY

## 2021-10-14 ENCOUNTER — Ambulatory Visit: Payer: 59 | Admitting: Obstetrics and Gynecology

## 2021-10-15 ENCOUNTER — Encounter (INDEPENDENT_AMBULATORY_CARE_PROVIDER_SITE_OTHER): Payer: Self-pay | Admitting: Bariatrics

## 2021-10-15 ENCOUNTER — Ambulatory Visit (INDEPENDENT_AMBULATORY_CARE_PROVIDER_SITE_OTHER): Payer: 59 | Admitting: Bariatrics

## 2021-10-15 ENCOUNTER — Other Ambulatory Visit (HOSPITAL_COMMUNITY): Payer: Self-pay

## 2021-10-15 ENCOUNTER — Encounter (INDEPENDENT_AMBULATORY_CARE_PROVIDER_SITE_OTHER): Payer: Self-pay

## 2021-10-15 VITALS — BP 132/80 | HR 89 | Temp 98.4°F | Ht 64.0 in | Wt 267.0 lb

## 2021-10-15 DIAGNOSIS — Z6841 Body Mass Index (BMI) 40.0 and over, adult: Secondary | ICD-10-CM

## 2021-10-15 DIAGNOSIS — R632 Polyphagia: Secondary | ICD-10-CM | POA: Diagnosis not present

## 2021-10-15 DIAGNOSIS — E669 Obesity, unspecified: Secondary | ICD-10-CM

## 2021-10-15 DIAGNOSIS — E8881 Metabolic syndrome: Secondary | ICD-10-CM

## 2021-10-15 DIAGNOSIS — E88819 Insulin resistance, unspecified: Secondary | ICD-10-CM

## 2021-10-15 MED ORDER — WEGOVY 0.5 MG/0.5ML ~~LOC~~ SOAJ
0.5000 mg | SUBCUTANEOUS | 0 refills | Status: DC
  Filled 2021-10-15: qty 2, 28d supply, fill #0

## 2021-10-15 MED ORDER — TIRZEPATIDE 2.5 MG/0.5ML ~~LOC~~ SOAJ
2.5000 mg | SUBCUTANEOUS | 0 refills | Status: DC
  Filled 2021-10-15 (×2): qty 2, 28d supply, fill #0

## 2021-10-21 ENCOUNTER — Encounter (INDEPENDENT_AMBULATORY_CARE_PROVIDER_SITE_OTHER): Payer: Self-pay

## 2021-10-21 ENCOUNTER — Telehealth (INDEPENDENT_AMBULATORY_CARE_PROVIDER_SITE_OTHER): Payer: Self-pay | Admitting: Bariatrics

## 2021-10-21 NOTE — Telephone Encounter (Signed)
Dr. Brown - Prior authorization denied for Mounjaro. Per insurance: Patient does not have type 2  diabetes. Patient sent denial message via mychart.  

## 2021-10-27 NOTE — Progress Notes (Unsigned)
Chief Complaint:   OBESITY [name] is here to discuss her progress with her obesity treatment plan along with follow-up of her obesity related diagnoses. [name] is on the Category 3 Plan and keeping a food journal and adhering to recommended goals of 1500 calories and 90+ grams of protein daily and states she is following her eating plan approximately 0% of the time. [name] states she is doing 0 minutes 0 times per week.  Today's visit was #: 26 Starting weight: 262 lbs Starting date: 03/26/2020 Today's weight: 267 lbs Today's date: 10/15/2021 Total lbs lost to date: 0 Total lbs lost since last in-office visit: 0  Interim History: [name] is busy at work, and she is unable to sleep. Her appetite is up.   Subjective:   1. Polyphagia [name] was prescribed DPOEUM, but it was not approved by her insurance.   2. Insulin resistance [name] is not on medications currently.   Assessment/Plan:   1. [name] agreed to start Mounjaro 2.5 mg once weekly, with no refills.   - tirzepatide [facility]) 2.5 MG/0.5ML Pen; Inject 2.5 mg into the skin once a week.  Dispense: 2 mL; Refill: 0  2. Insulin resistance [name] will working decreasing all carbohydrates.   3. Obesity, Current BMI 45.9 [name] is currently in the action stage of change. As such, her goal is to continue with weight loss efforts. She has agreed to the Category 3 Plan.   [name] will continue phentermine.   Exercise goals: No exercise has been prescribed at this time.  Behavioral modification strategies: increasing lean protein intake, decreasing simple carbohydrates, increasing vegetables, increasing water intake, decreasing eating out, no skipping meals, meal planning and cooking strategies, keeping healthy foods in the home, and planning for success.  [name] has agreed to follow-up with our clinic in 4 weeks. She was informed of the importance of frequent follow-up visits to maximize her  success with intensive lifestyle modifications for her multiple health conditions.   Objective:   Blood pressure 132/80, pulse 89, temperature 98.4 F (36.9 C), height 5\' 4"  (1.626 m), weight 267 lb (121.1 kg), last menstrual period 09/21/2021, SpO2 98 %. Body mass index is 45.83 kg/m.  General: Cooperative, alert, well developed, in no acute distress. HEENT: Conjunctivae and lids unremarkable. Cardiovascular: Regular rhythm.  Lungs: Normal work of breathing. Neurologic: No focal deficits.   Lab Results  Component Value Date   CREATININE 0.68 03/23/2021   BUN 16 03/23/2021   NA 140 03/23/2021   K 4.5 03/23/2021   CL 105 03/23/2021   CO2 23 03/23/2021   Lab Results  Component Value Date   ALT 7 03/23/2021   AST 11 03/23/2021   ALKPHOS 44 03/23/2021   BILITOT 0.2 03/23/2021   Lab Results  Component Value Date   HGBA1C 5.4 03/23/2021   Lab Results  Component Value Date   INSULIN 16.6 03/23/2021   INSULIN 22.2 03/26/2020   Lab Results  Component Value Date   TSH 2.060 03/26/2020   No results found for: "CHOL", "HDL", "LDLCALC", "LDLDIRECT", "TRIG", "CHOLHDL" Lab Results  Component Value Date   VD25OH 29.7 (L) 03/23/2021   Lab Results  Component Value Date   WBC 8.0 12/15/2015   HGB 12.2 04/16/2016   HCT 36.0 04/16/2016   MCV 90.2 12/15/2015   PLT 316 12/15/2015   No results found for: "IRON", "TIBC", "FERRITIN"  Attestation Statements:   Reviewed by clinician on day of visit: allergies, medications, problem list, medical history, surgical history,  family history, social history, and previous encounter notes.   Trude Mcburney, am acting as Energy manager for [employer], DO.  I have reviewed the above documentation for accuracy and completeness, and I agree with the above. Corinna Capra, DO

## 2021-10-28 ENCOUNTER — Encounter (INDEPENDENT_AMBULATORY_CARE_PROVIDER_SITE_OTHER): Payer: Self-pay | Admitting: Bariatrics

## 2021-10-31 ENCOUNTER — Other Ambulatory Visit (HOSPITAL_COMMUNITY): Payer: Self-pay

## 2021-11-02 ENCOUNTER — Other Ambulatory Visit (HOSPITAL_COMMUNITY): Payer: Self-pay

## 2021-11-12 ENCOUNTER — Ambulatory Visit (INDEPENDENT_AMBULATORY_CARE_PROVIDER_SITE_OTHER): Payer: 59 | Admitting: Family Medicine

## 2021-11-12 ENCOUNTER — Encounter (INDEPENDENT_AMBULATORY_CARE_PROVIDER_SITE_OTHER): Payer: Self-pay | Admitting: Family Medicine

## 2021-11-12 VITALS — BP 128/85 | HR 84 | Temp 98.1°F | Ht 64.0 in | Wt 271.0 lb

## 2021-11-12 DIAGNOSIS — E8881 Metabolic syndrome: Secondary | ICD-10-CM | POA: Diagnosis not present

## 2021-11-12 DIAGNOSIS — R632 Polyphagia: Secondary | ICD-10-CM | POA: Diagnosis not present

## 2021-11-12 DIAGNOSIS — E7849 Other hyperlipidemia: Secondary | ICD-10-CM

## 2021-11-12 DIAGNOSIS — Z6841 Body Mass Index (BMI) 40.0 and over, adult: Secondary | ICD-10-CM

## 2021-11-12 DIAGNOSIS — Z9189 Other specified personal risk factors, not elsewhere classified: Secondary | ICD-10-CM

## 2021-11-12 DIAGNOSIS — R5383 Other fatigue: Secondary | ICD-10-CM | POA: Insufficient documentation

## 2021-11-12 DIAGNOSIS — E669 Obesity, unspecified: Secondary | ICD-10-CM

## 2021-11-12 DIAGNOSIS — E785 Hyperlipidemia, unspecified: Secondary | ICD-10-CM | POA: Insufficient documentation

## 2021-11-12 DIAGNOSIS — E559 Vitamin D deficiency, unspecified: Secondary | ICD-10-CM | POA: Insufficient documentation

## 2021-11-28 NOTE — Progress Notes (Unsigned)
Chief Complaint:   OBESITY [name] is here to discuss her progress with her obesity treatment plan along with follow-up of her obesity related diagnoses. [name] is on the Category 3 Plan and states she is following her eating plan approximately 60% of the time. [name] states she is not currently exercising.  Today's visit was #: 74 Starting weight: 262 lbs Starting date: 03/26/2020 Today's weight: 271 lbs Today's date: 11/12/2021 Total lbs lost to date: 0 Total lbs lost since last in-office visit: +4  Interim History: This is [name]'s first OV with me. She was previously seen and managed by Dr. Owens Shark. 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 She has no time to meal prep and no time for self-care.  Subjective:   1. Polyphagia [name] is on phentermine 37.5 mg daily, prescribed by Dr. Owens Shark on 09/14/2021. She was 264 lbs then, and now 271 lbs. She feels it elevates her BP and causes headaches and does very little helping her stay on track.  2. Other fatigue [name] admits to daytime somnolence and admits to waking up still tired. Patient has a history of symptoms of morning fatigue. [name] generally gets 6 hours of sleep per night, and states that she has generally restful sleep. Snoring is present. Apneic episodes are present.    3. Other hyperlipidemia Pt's LDL was 140 on last check in December 2021, and she endorses elevated triglycerides as well.  4. Insulin resistance Her fasting insulin was 16.6 in February 2023.  5. Vitamin D deficiency Pt's last Vit D level was checked 03/2021 at 29.7. She endorses she wasn't told she needed to continue supplementation. Pt has not been on OTC supplement or anything.  6. At risk for malnutrition [name] is at risk for malnutrition due to her current eating habits.  Assessment/Plan:   Orders Placed This Encounter  Procedures   VITAMIN D 25 Hydroxy  (Vit-D Deficiency, Fractures)   TSH   T4, free   Lipid Panel With LDL/HDL Ratio   Insulin, random   Hemoglobin A1c   Folate   Comprehensive metabolic panel   CBC with Differential/Platelet   Vitamin B12    Medications Discontinued During This Encounter  Medication Reason   phentermine 15 MG capsule    tirzepatide (MOUNJARO) 2.5 MG/0.5ML Pen      No orders of the defined types were placed in this encounter.    1. Polyphagia Intensive lifestyle modifications are the first line treatment for this issue. We discussed several lifestyle modifications today and she will continue to work on diet, exercise and weight loss efforts. Orders and follow up as documented in patient record.  Discontinue phentermine. [name] is not approved by insurance and [name] is also not approved and too expensive.  Counseling Polyphagia is excessive hunger. Causes can include: low blood sugars, hypERthyroidism, PMS, lack of sleep, stress, insulin resistance, diabetes, certain medications, and diets that are deficient in protein and fiber.   2. Other fatigue [name] does feel that her weight is causing her energy to be lower than it should be. Fatigue may be related to obesity, depression or many other causes. Labs will be ordered, and in the meanwhile, [name] will focus on self care including making healthy food choices, increasing physical activity and focusing on stress reduction.  Check fasting labs at next OV. - TSH - T4, free - Folate - CBC with Differential/Platelet - Vitamin B12  3. Other hyperlipidemia Cardiovascular risk and specific lipid/LDL goals reviewed.  We discussed several lifestyle modifications today and [name] will continue to work on diet, exercise and weight loss efforts. Orders and follow up as documented in patient record.   Counseling Intensive lifestyle modifications are the first line treatment for this issue. Dietary changes: Increase soluble fiber. Decrease simple  carbohydrates. Exercise changes: Moderate to vigorous-intensity aerobic activity 150 minutes per week if tolerated. Lipid-lowering medications: see documented in medical record.  Check fasting labs at next OV. - Lipid Panel With LDL/HDL Ratio - Comprehensive metabolic panel  4. Insulin resistance [name] will continue to work on weight loss, exercise, and decreasing simple carbohydrates to help decrease the risk of diabetes. [name] agreed to follow-up with [name] as directed to closely monitor her progress.  Check fasting labs at next OV. - Insulin, random - Hemoglobin A1c  5. Vitamin D deficiency - I again reiterated the importance of vitamin D (as well as calcium) to their health and wellbeing.  - I reviewed possible symptoms of low Vitamin D:  low energy, depressed mood, muscle aches, joint aches, osteoporosis etc. - low Vitamin D levels may be linked to an increased risk of cardiovascular events and even increased risk of cancers- such as colon and breast.  - ideal vitamin D levels reviewed with patient  - Informed patient this may be a lifelong thing, and she was encouraged to continue to take the medicine until told otherwise.    - weight loss will likely improve availability of vitamin D, thus encouraged Teanna to continue with meal plan and their weight loss efforts to further improve this condition.  Thus, we will need to monitor levels regularly (every 3-4 mo on average) to keep levels within normal limits and prevent over supplementation. - pt's questions and concerns regarding this condition addressed.  Check fasting labs at next OV. - VITAMIN D 25 Hydroxy (Vit-D Deficiency, Fractures)  6. At risk for malnutrition [name] was given extensive malnutrition prevention education and counseling today of more than 15 minutes.  Counseled her that malnutrition refers to inappropriate nutrients or not the right balance of nutrients for optimal health.  Discussed with [name]  that it is absolutely possible to be malnourished but yet obese.  Risk factors, including but not limited to, inappropriate dietary choices, difficulty with obtaining food due to physical or financial limitations, and various physical and mental health conditions were reviewed with [name].   7. Obesity, Current BMI 46.6 [name] is currently in the action stage of change. As such, her goal is to continue with weight loss efforts. She has agreed to the Category 3 Plan.   Exercise goals:  As is  Behavioral modification strategies: meal planning and cooking strategies, avoiding temptations, and planning for success.  [name] has agreed to follow-up with our clinic in 3-4 weeks. Come fasting 30 minutes prior for repeat IC and bloodwork. She was informed of the importance of frequent follow-up visits to maximize her success with intensive lifestyle modifications for her multiple health conditions.   [name] was informed we would discuss her lab results at her next visit unless there is a critical issue that needs to be addressed sooner. [name] agreed to keep her next visit at the agreed upon time to discuss these results.  Objective:   Blood pressure 128/85, pulse 84, temperature 98.1 F (36.7 C), height 5\' 4"  (1.626 m), weight 271 lb (122.9 kg), SpO2 100 %. Body mass index is 46.52 kg/m.  General: Cooperative, alert, well developed, in no acute distress. HEENT: Conjunctivae and lids unremarkable. Cardiovascular: Regular rhythm.  Lungs: Normal work of breathing. Neurologic: No focal deficits.   Lab Results  Component Value Date   CREATININE 0.68 03/23/2021   BUN 16 03/23/2021   NA 140 03/23/2021   K 4.5 03/23/2021   CL 105 03/23/2021   CO2 23 03/23/2021   Lab Results  Component Value Date   ALT 7 03/23/2021   AST 11 03/23/2021   ALKPHOS 44 03/23/2021   BILITOT 0.2 03/23/2021   Lab Results  Component Value Date   HGBA1C 5.4 03/23/2021   Lab Results  Component  Value Date   INSULIN 16.6 03/23/2021   INSULIN 22.2 03/26/2020   Lab Results  Component Value Date   TSH 2.060 03/26/2020   No results found for: "CHOL", "HDL", "LDLCALC", "LDLDIRECT", "TRIG", "CHOLHDL" Lab Results  Component Value Date   VD25OH 29.7 (L) 03/23/2021   Lab Results  Component Value Date   WBC 8.0 12/15/2015   HGB 12.2 04/16/2016   HCT 36.0 04/16/2016   MCV 90.2 12/15/2015   PLT 316 12/15/2015    Attestation Statements:   Reviewed by clinician on day of visit: allergies, medications, problem list, medical history, surgical history, family history, social history, and previous encounter notes.  I, Kyung Rudd, BS, CMA, am acting as transcriptionist for [employer], DO.  I have reviewed the above documentation for accuracy and completeness, and I agree with the above. [name], D.O.  The 21st Century Cures Act was signed into law in 2016 which includes the topic of electronic health records.  This provides immediate access to information in MyChart.  This includes consultation notes, operative notes, office notes, lab results and pathology reports.  If you have any questions about what you read please let us know at your next visit so we can discuss your concerns and take corrective action if need be.  We are right here with you.

## 2021-12-10 ENCOUNTER — Encounter (INDEPENDENT_AMBULATORY_CARE_PROVIDER_SITE_OTHER): Payer: Self-pay | Admitting: *Deleted

## 2021-12-11 ENCOUNTER — Other Ambulatory Visit (HOSPITAL_COMMUNITY): Payer: Self-pay

## 2021-12-14 ENCOUNTER — Encounter (INDEPENDENT_AMBULATORY_CARE_PROVIDER_SITE_OTHER): Payer: Self-pay | Admitting: Family Medicine

## 2021-12-14 ENCOUNTER — Ambulatory Visit (INDEPENDENT_AMBULATORY_CARE_PROVIDER_SITE_OTHER): Payer: 59 | Admitting: Family Medicine

## 2021-12-14 VITALS — BP 122/85 | HR 88 | Temp 97.6°F | Ht 64.0 in | Wt 274.0 lb

## 2021-12-14 DIAGNOSIS — E88819 Insulin resistance, unspecified: Secondary | ICD-10-CM | POA: Diagnosis not present

## 2021-12-14 DIAGNOSIS — Z6841 Body Mass Index (BMI) 40.0 and over, adult: Secondary | ICD-10-CM | POA: Diagnosis not present

## 2021-12-14 DIAGNOSIS — F3289 Other specified depressive episodes: Secondary | ICD-10-CM | POA: Diagnosis not present

## 2021-12-14 DIAGNOSIS — R0602 Shortness of breath: Secondary | ICD-10-CM | POA: Diagnosis not present

## 2021-12-14 DIAGNOSIS — E669 Obesity, unspecified: Secondary | ICD-10-CM

## 2021-12-14 DIAGNOSIS — F32A Depression, unspecified: Secondary | ICD-10-CM | POA: Insufficient documentation

## 2021-12-14 MED ORDER — BUPROPION HCL ER (SR) 150 MG PO TB12: 150.0000 mg | ORAL_TABLET | Freq: Two times a day (BID) | ORAL | 0 refills | Status: DC

## 2021-12-15 LAB — HEMOGLOBIN A1C
Est. average glucose Bld gHb Est-mCnc: 111 mg/dL
Hgb A1c MFr Bld: 5.5 % (ref 4.8–5.6)

## 2021-12-15 LAB — LIPID PANEL WITH LDL/HDL RATIO
Cholesterol, Total: 203 mg/dL — ABNORMAL HIGH (ref 100–199)
HDL: 57 mg/dL (ref >39)
LDL Chol Calc (NIH): 135 mg/dL — ABNORMAL HIGH (ref 0–99)
LDL/HDL Ratio: 2.4 ratio (ref 0.0–3.2)
Triglycerides: 58 mg/dL (ref 0–149)
VLDL Cholesterol Cal: 11 mg/dL (ref 5–40)

## 2021-12-15 LAB — COMPREHENSIVE METABOLIC PANEL
ALT: 9 IU/L (ref 0–32)
AST: 12 IU/L (ref 0–40)
Albumin/Globulin Ratio: 1.7 (ref 1.2–2.2)
Albumin: 4 g/dL (ref 3.9–4.9)
Alkaline Phosphatase: 48 IU/L (ref 44–121)
BUN/Creatinine Ratio: 22 (ref 9–23)
BUN: 16 mg/dL (ref 6–24)
Bilirubin Total: <0.2 mg/dL (ref 0.0–1.2)
CO2: 22 mmol/L (ref 20–29)
Calcium: 9 mg/dL (ref 8.7–10.2)
Chloride: 106 mmol/L (ref 96–106)
Creatinine, Ser: 0.73 mg/dL (ref 0.57–1.00)
Globulin, Total: 2.4 g/dL (ref 1.5–4.5)
Glucose: 85 mg/dL (ref 70–99)
Potassium: 4.4 mmol/L (ref 3.5–5.2)
Sodium: 142 mmol/L (ref 134–144)
Total Protein: 6.4 g/dL (ref 6.0–8.5)
eGFR: 105 mL/min/{1.73_m2} (ref >59)

## 2021-12-15 LAB — CBC WITH DIFFERENTIAL/PLATELET
Basophils Absolute: 0 10*3/uL (ref 0.0–0.2)
Basos: 1 %
EOS (ABSOLUTE): 0.2 10*3/uL (ref 0.0–0.4)
Eos: 5 %
Hematocrit: 34.6 % (ref 34.0–46.6)
Hemoglobin: 10.9 g/dL — ABNORMAL LOW (ref 11.1–15.9)
Immature Grans (Abs): 0 10*3/uL (ref 0.0–0.1)
Immature Granulocytes: 0 %
Lymphocytes Absolute: 1.4 10*3/uL (ref 0.7–3.1)
Lymphs: 28 %
MCH: 28.2 pg (ref 26.6–33.0)
MCHC: 31.5 g/dL (ref 31.5–35.7)
MCV: 90 fL (ref 79–97)
Monocytes Absolute: 0.4 10*3/uL (ref 0.1–0.9)
Monocytes: 8 %
Neutrophils Absolute: 3 10*3/uL (ref 1.4–7.0)
Neutrophils: 58 %
Platelets: 322 10*3/uL (ref 150–450)
RBC: 3.86 x10E6/uL (ref 3.77–5.28)
RDW: 13.6 % (ref 11.7–15.4)
WBC: 5.1 10*3/uL (ref 3.4–10.8)

## 2021-12-15 LAB — VITAMIN B12: Vitamin B-12: 348 pg/mL (ref 232–1245)

## 2021-12-15 LAB — TSH: TSH: 2.37 u[IU]/mL (ref 0.450–4.500)

## 2021-12-15 LAB — T4, FREE: Free T4: 1.1 ng/dL (ref 0.82–1.77)

## 2021-12-15 LAB — FOLATE: Folate: 8.1 ng/mL (ref >3.0)

## 2021-12-15 LAB — VITAMIN D 25 HYDROXY (VIT D DEFICIENCY, FRACTURES): Vit D, 25-Hydroxy: 25.6 ng/mL — ABNORMAL LOW (ref 30.0–100.0)

## 2021-12-15 LAB — INSULIN, RANDOM: INSULIN: 18.7 u[IU]/mL (ref 2.6–24.9)

## 2021-12-23 NOTE — Progress Notes (Signed)
Chief Complaint:   OBESITY [name] is here to discuss her progress with her obesity treatment plan along with follow-up of her obesity related diagnoses. [name] is on the Category 3 Plan and states she is following her eating plan approximately 75% of the time. [name] states she is exercising 0 minutes 0 times per week.  Today's visit was #: 28 Starting weight: 262 lbs Starting date: 03/26/2020 Today's weight: 274 lbs Today's date: 12/14/2021 Total lbs lost to date: 0 lbs Total lbs lost since last in-office visit: 0  Interim History: [name] felt that she was better on the diet but snacking at night was likely over. She is using my net dairy. She has lost a total of 20 lbs as of today.  Subjective:   1. SOBOE (shortness of breath on exertion)   2. Insulin resistance [name] admits to not eating proteins and not measuring foods--protein, fruits and veggies. Somme emotional eating and cravings at night.   3. Other depression with emotional eating [name] has some history of anxiety, was on Buspar in the past. Then Dr. Manson Passey put her on Wellbutrin, she also had some sexual dysfunction but she felt better on Bupropion.    Assessment/Plan:  No orders of the defined types were placed in this encounter.   There are no discontinued medications.   Meds ordered this encounter  Medications   buPROPion (WELLBUTRIN SR) 150 MG 12 hr tablet    Sig: Take 1 tablet (150 mg total) by mouth 2 (two) times daily.    Dispense:  60 tablet    Refill:  0     1. SOBOE (shortness of breath on exertion) Rechecked IC--decreased meal plan to Cat 2 due to 26 lbs of weight loss and a decrease in REE from 2290 on 2/22 to 1901 today.    2. Insulin resistance Checked labs today. Increase proteins and follow meal plan closely. Will consider Metformin in future.  3. Other depression with emotional eating Start Wellbutrin SR 150 mg by mouth once a day for 3 weeks then twice a day. Fill for 1  month with 0 refills. Patient feels sex side effects likely due to combo of medications. Increase walking more and more.  -Start buPROPion (WELLBUTRIN SR) 150 MG 12 hr tablet; Take 1 tablet (150 mg total) by mouth 2 (two) times daily.  Dispense: 60 tablet; Refill: 0  4. Obesity,current BMI 47.2 [name] is currently in the action stage of change. As such, her goal is to continue with weight loss efforts. She has agreed to the Category 2 Plan and keeping a food journal and adhering to recommended goals of 1400-1500 calories and 110+ grams of protein.   [name]'s IC changed from 2290 to 1901. Changed meal plan to Cat 2 with journal intake. Will obtain labs today.  Exercise goals: All adults should avoid inactivity. Some physical activity is better than none, and adults who participate in any amount of physical activity gain some health benefits.  Walk 10 min per week.  Behavioral modification strategies: planning for success and keeping a strict food journal.  [name] has agreed to follow-up with our clinic in 5 weeks. She was informed of the importance of frequent follow-up visits to maximize her success with intensive lifestyle modifications for her multiple health conditions.   [name] was informed we would discuss her lab results at her next visit unless there is a critical issue that needs to be addressed sooner. [name] agreed to keep her next visit at  the agreed upon time to discuss these results.  Objective:   Blood pressure 122/85, pulse 88, temperature 97.6 F (36.4 C), height 5\' 4"  (1.626 m), weight 274 lb (124.3 kg), SpO2 99 %. Body mass index is 47.03 kg/m.  General: Cooperative, alert, well developed, in no acute distress. HEENT: Conjunctivae and lids unremarkable. Cardiovascular: Regular rhythm.  Lungs: Normal work of breathing. Neurologic: No focal deficits.   Lab Results  Component Value Date   CREATININE 0.73 12/14/2021   BUN 16 12/14/2021   NA 142 12/14/2021    K 4.4 12/14/2021   CL 106 12/14/2021   CO2 22 12/14/2021   Lab Results  Component Value Date   ALT 9 12/14/2021   AST 12 12/14/2021   ALKPHOS 48 12/14/2021   BILITOT <0.2 12/14/2021   Lab Results  Component Value Date   HGBA1C 5.5 12/14/2021   HGBA1C 5.4 03/23/2021   Lab Results  Component Value Date   INSULIN 18.7 12/14/2021   INSULIN 16.6 03/23/2021   INSULIN 22.2 03/26/2020   Lab Results  Component Value Date   TSH 2.370 12/14/2021   Lab Results  Component Value Date   CHOL 203 (H) 12/14/2021   HDL 57 12/14/2021   LDLCALC 135 (H) 12/14/2021   TRIG 58 12/14/2021   Lab Results  Component Value Date   VD25OH 25.6 (L) 12/14/2021   VD25OH 29.7 (L) 03/23/2021   Lab Results  Component Value Date   WBC 5.1 12/14/2021   HGB 10.9 (L) 12/14/2021   HCT 34.6 12/14/2021   MCV 90 12/14/2021   PLT 322 12/14/2021   No results found for: "IRON", "TIBC", "FERRITIN"  Attestation Statements:   Reviewed by clinician on day of visit: allergies, medications, problem list, medical history, surgical history, family history, social history, and previous encounter notes.  I, Brendell Tyus, am acting as 12/16/2021 for Energy manager, DO.   I have reviewed the above documentation for accuracy and completeness, and I agree with the above. [employer], D.O.  The 21st Century Cures Act was signed into law in 2016 which includes the topic of electronic health records.  This provides immediate access to information in MyChart.  This includes consultation notes, operative notes, office notes, lab results and pathology reports.  If you have any questions about what you read please let 2017 know at your next visit so we can discuss your concerns and take corrective action if need be.  We are right here with you.

## 2022-01-04 ENCOUNTER — Encounter (INDEPENDENT_AMBULATORY_CARE_PROVIDER_SITE_OTHER): Payer: Self-pay | Admitting: Family Medicine

## 2022-01-04 ENCOUNTER — Ambulatory Visit (INDEPENDENT_AMBULATORY_CARE_PROVIDER_SITE_OTHER): Payer: 59 | Admitting: Family Medicine

## 2022-01-04 VITALS — BP 125/81 | HR 83 | Temp 98.0°F | Ht 64.0 in | Wt 274.2 lb

## 2022-01-04 DIAGNOSIS — E88819 Insulin resistance, unspecified: Secondary | ICD-10-CM

## 2022-01-04 DIAGNOSIS — Z6841 Body Mass Index (BMI) 40.0 and over, adult: Secondary | ICD-10-CM

## 2022-01-04 DIAGNOSIS — E538 Deficiency of other specified B group vitamins: Secondary | ICD-10-CM

## 2022-01-04 DIAGNOSIS — E7849 Other hyperlipidemia: Secondary | ICD-10-CM

## 2022-01-04 DIAGNOSIS — E559 Vitamin D deficiency, unspecified: Secondary | ICD-10-CM | POA: Diagnosis not present

## 2022-01-04 DIAGNOSIS — E611 Iron deficiency: Secondary | ICD-10-CM | POA: Diagnosis not present

## 2022-01-04 DIAGNOSIS — E669 Obesity, unspecified: Secondary | ICD-10-CM

## 2022-01-04 DIAGNOSIS — F3289 Other specified depressive episodes: Secondary | ICD-10-CM

## 2022-01-04 MED ORDER — VITAMIN D (ERGOCALCIFEROL) 1.25 MG (50000 UNIT) PO CAPS: 50000.0000 [IU] | ORAL_CAPSULE | ORAL | 0 refills | Status: DC

## 2022-01-04 MED ORDER — CYANOCOBALAMIN 500 MCG PO TABS: ORAL_TABLET | ORAL | Status: DC

## 2022-01-04 NOTE — Patient Instructions (Signed)
The 10-year ASCVD risk score (Arnett DK, et al., 2019) is: 0.6%   Values used to calculate the score:     Age: 41 years     Sex: Female     Is Non-Hispanic African American: No     Diabetic: No     Tobacco smoker: No     Systolic Blood Pressure: 125 mmHg     Is BP treated: No     HDL Cholesterol: 57 mg/dL     Total Cholesterol: 203 mg/dL

## 2022-01-10 ENCOUNTER — Other Ambulatory Visit (INDEPENDENT_AMBULATORY_CARE_PROVIDER_SITE_OTHER): Payer: Self-pay | Admitting: Family Medicine

## 2022-01-10 DIAGNOSIS — F3289 Other specified depressive episodes: Secondary | ICD-10-CM

## 2022-01-18 DIAGNOSIS — E611 Iron deficiency: Secondary | ICD-10-CM | POA: Insufficient documentation

## 2022-01-18 NOTE — Progress Notes (Signed)
Chief Complaint:   OBESITY [name] is here to discuss her progress with her obesity treatment plan along with follow-up of her obesity related diagnoses. [name] is on the Category 2 Plan and keeping a food journal and adhering to recommended goals of 1400-1500 calories and 110 protein and states she is following her eating plan approximately 85-90% of the time. [name] states she is walking 10-15 minutes 7 times per week.  Today's visit was #: 29 Starting weight: 262 lbs Starting date: 03/26/2020 Today's weight: 274 lbs Today's date: 01/04/2022 Total lbs lost to date: 0 Total lbs lost since last in-office visit: 0  Interim History: Had a few days she did not get all her proteins in .  She she has been more active.  She is mostly under in calories except the 3 days she is over.  She is under in protein and cannot get it in, she is still struggling. Patient lost 2 lbs of fat mass and gained in muscle mass.  Here to review all labs drawn last office visit.  I reviewed CBC, CMP, FLP, FI, A1c in detail with patient today.   Subjective:   1. Iron deficiency Discussed labs with patient today. Patient just started 325 mg FeSo4, 2 weeks ago. CBC shows decreased Hgb levels.   2. Insulin resistance Worsening. Discussed labs with patient today.. Worsening insulin resistance from when last checked.  A1c slightly worse.    3. Other hyperlipidemia Discussed labs with patient today. LDL elevated at 135.  Trying to eat less fats.  The 10-year ASCVD risk score (Arnett DK, et al., 2019) is: 0.6%   Values used to calculate the score:     Age: 41 years     Sex: Female     Is Non-Hispanic African American: No     Diabetic: No     Tobacco smoker: No     Systolic Blood Pressure: 125 mmHg     Is BP treated: No     HDL Cholesterol: 57 mg/dL     Total Cholesterol: 203 mg/dL  4. Vitamin D deficiency Worsening. Discussed labs with patient today. Patient been consistent with weekly use and  now Vitamin D is worse.   5. B12 deficiency Discussed labs with patient today. Positive for fatigue which is slowly improving.  When she eats healthy.  She notices best improvements.  6. Other depression with emotional eating Discussed labs with patient today. [name] is tolerating medication(s) well without side effects.  Medication compliance is good as patient endorses taking it as prescribed.  The patient denies additional concerns regarding this condition.  We started Wellbutrin last office visit She is on it  BID for only one week.  Overall sleeping is okay unless she takes 2nd dose too late in the day.  She had more motivation and improving mood.   Assessment/Plan:  No orders of the defined types were placed in this encounter.   There are no discontinued medications.   Meds ordered this encounter  Medications   cyanocobalamin (VITAMIN B12) 500 MCG tablet    Sig: 300-530mcg once daily   Vitamin D, Ergocalciferol, (DRISDOL) 1.25 MG (50000 UNIT) CAPS capsule    Sig: Take 1 capsule (50,000 Units total) by mouth every 7 (seven) days.    Dispense:  4 capsule    Refill:  0     1. Iron deficiency Continue 325 mg Fe daily and recheck in 3-4 months.  For heavy menses patient will follow up  with GYN doctor.   2. Insulin resistance Handouts given on insulin resistance, counseling done.  Weight loss via PNP.  Continue to increase activity.  3. Other hyperlipidemia Continue PNP with decreased saturated and trans fats.  No need for medication, but continue to exercise.   4. Vitamin D deficiency - I again reiterated the importance of vitamin D (as well as calcium) to their health and wellbeing.  - I reviewed possible symptoms of low Vitamin D:  low energy, depressed mood, muscle aches, joint aches, osteoporosis etc. - low Vitamin D levels may be linked to an increased risk of cardiovascular events and even increased risk of cancers- such as colon and breast.  - ideal vitamin D  levels reviewed with patient  - I recommend pt take a 50,000 IU weekly prescription vit D - see script below   - Informed patient this may be a lifelong thing, and she was encouraged to continue to take the medicine until told otherwise.    - weight loss will likely improve availability of vitamin D, thus encouraged [name] to continue with meal plan and their weight loss efforts to further improve this condition.  Thus, we will need to monitor levels regularly (every 3-4 mo on average) to keep levels within normal limits and prevent over supplementation. - pt's questions and concerns regarding this condition addressed.  Restart - Vitamin D, Ergocalciferol, (DRISDOL) 1.25 MG (50000 UNIT) CAPS capsule; Take 1 capsule (50,000 Units total) by mouth every 7 (seven) days.  Dispense: 4 capsule; Refill: 0  5. B12 deficiency B12 only 348.  B12 deficiency  counseling done.  Continue B12 rich PNP.  Recheck levels in 3-4  months  Start - cyanocobalamin (VITAMIN B12) 500 MCG tablet; 300-519mcg once daily  6. Other depression with emotional eating Mood stable.   Continue Wellbutrin BID.   7. Obesity, current BMI 47.1 Holiday eating strategies discussed with patient.  [name] is currently in the action stage of change. As such, her goal is to continue with weight loss efforts. She has agreed to the Category 2 Plan and keeping a food journal and adhering to recommended goals of 1400-1500 calories and 110 protein.   Exercise goals:  As is.   Behavioral modification strategies: increasing lean protein intake, holiday eating strategies , and avoiding temptations.  [name] has agreed to follow-up with our clinic in 3 weeks. She was informed of the importance of frequent follow-up visits to maximize her success with intensive lifestyle modifications for her multiple health conditions.   Objective:   Blood pressure 125/81, pulse 83, temperature 98 F (36.7 C), height 5\' 4"  (1.626 m), weight 274 lb 3.2 oz  (124.4 kg), SpO2 100 %. Body mass index is 47.07 kg/m.  General: Cooperative, alert, well developed, in no acute distress. HEENT: Conjunctivae and lids unremarkable. Cardiovascular: Regular rhythm.  Lungs: Normal work of breathing. Neurologic: No focal deficits.   Lab Results  Component Value Date   CREATININE 0.73 12/14/2021   BUN 16 12/14/2021   NA 142 12/14/2021   K 4.4 12/14/2021   CL 106 12/14/2021   CO2 22 12/14/2021   Lab Results  Component Value Date   ALT 9 12/14/2021   AST 12 12/14/2021   ALKPHOS 48 12/14/2021   BILITOT <0.2 12/14/2021   Lab Results  Component Value Date   HGBA1C 5.5 12/14/2021   HGBA1C 5.4 03/23/2021   Lab Results  Component Value Date   INSULIN 18.7 12/14/2021   INSULIN 16.6 03/23/2021   INSULIN  22.2 03/26/2020   Lab Results  Component Value Date   TSH 2.370 12/14/2021   Lab Results  Component Value Date   CHOL 203 (H) 12/14/2021   HDL 57 12/14/2021   LDLCALC 135 (H) 12/14/2021   TRIG 58 12/14/2021   Lab Results  Component Value Date   VD25OH 25.6 (L) 12/14/2021   VD25OH 29.7 (L) 03/23/2021   Lab Results  Component Value Date   WBC 5.1 12/14/2021   HGB 10.9 (L) 12/14/2021   HCT 34.6 12/14/2021   MCV 90 12/14/2021   PLT 322 12/14/2021   No results found for: "IRON", "TIBC", "FERRITIN"  Attestation Statements:   Reviewed by clinician on day of visit: allergies, medications, problem list, medical history, surgical history, family history, social history, and previous encounter notes.  I, Malcolm Metro, RMA, am acting as Energy manager for [employer], DO.  I have reviewed the above documentation for accuracy and completeness, and I agree with the above. [name], D.O.  The 21st Century Cures Act was signed into law in 2016 which includes the topic of electronic health records.  This provides immediate access to information in MyChart.  This includes consultation notes, operative notes, office notes, lab  results and pathology reports.  If you have any questions about what you read please let us know at your next visit so we can discuss your concerns and take corrective action if need be.  We are right here with you.

## 2022-02-01 ENCOUNTER — Encounter (INDEPENDENT_AMBULATORY_CARE_PROVIDER_SITE_OTHER): Payer: Self-pay | Admitting: Family Medicine

## 2022-02-01 ENCOUNTER — Ambulatory Visit (INDEPENDENT_AMBULATORY_CARE_PROVIDER_SITE_OTHER): Payer: 59 | Admitting: Family Medicine

## 2022-02-01 VITALS — BP 137/83 | HR 80 | Temp 98.3°F | Ht 64.0 in | Wt 273.6 lb

## 2022-02-01 DIAGNOSIS — F3289 Other specified depressive episodes: Secondary | ICD-10-CM

## 2022-02-01 DIAGNOSIS — G4733 Obstructive sleep apnea (adult) (pediatric): Secondary | ICD-10-CM | POA: Insufficient documentation

## 2022-02-01 DIAGNOSIS — E538 Deficiency of other specified B group vitamins: Secondary | ICD-10-CM

## 2022-02-01 DIAGNOSIS — E559 Vitamin D deficiency, unspecified: Secondary | ICD-10-CM

## 2022-02-01 DIAGNOSIS — Z6841 Body Mass Index (BMI) 40.0 and over, adult: Secondary | ICD-10-CM

## 2022-02-01 DIAGNOSIS — E669 Obesity, unspecified: Secondary | ICD-10-CM

## 2022-02-01 MED ORDER — VITAMIN D (ERGOCALCIFEROL) 1.25 MG (50000 UNIT) PO CAPS: 50000.0000 [IU] | ORAL_CAPSULE | ORAL | 0 refills | Status: DC

## 2022-02-01 MED ORDER — BUPROPION HCL ER (SR) 150 MG PO TB12: 150.0000 mg | ORAL_TABLET | Freq: Two times a day (BID) | ORAL | 0 refills | Status: DC

## 2022-02-17 NOTE — Progress Notes (Signed)
Chief Complaint:   OBESITY [name] is here to discuss her progress with her obesity treatment plan along with follow-up of her obesity related diagnoses. [name] is on the Category 2 Plan and keeping a food journal and adhering to recommended goals of 1400-1500 calories and 100 protein and states she is following her eating plan approximately 80% of the time. [name] states she is not exercising.   Today's visit was #: 58 Starting weight: 262 lbs Starting date: 03/26/2020 Today's weight: 273 lbs Today's date: 02/01/2022 Total lbs lost to date: 0 Total lbs lost since last in-office visit: 1 lb  Interim History: Patient had follow up last week and did not eat much for one week.  She and her husband are on the plan together.  She has no issues with the plan. She did not bring in her log of calories and protein intake.   Subjective:   1. B12 deficiency She started B12 supplement last week.  She thinks 500 mcg daily.  2. Vitamin D deficiency Patient is taking vitamin D regularly every week.  She is tolerating well with no issues.  She has been taking it for about 1 month.  3. Obstructive sleep apnea CPAP nightly doing it regularly.  Feels much better with energy levels when on it.  4. Other depression with emotional eating Patient is tolerating medication well.  She started on 12/14/2021.  She is taking it twice daily and sleeping well.  No suicidal ideations it is helping to decrease emotional eating.  Assessment/Plan:  No orders of the defined types were placed in this encounter.   Medications Discontinued During This Encounter  Medication Reason   cholecalciferol (VITAMIN D) 1000 units tablet    buPROPion (WELLBUTRIN SR) 150 MG 12 hr tablet Reorder   Vitamin D, Ergocalciferol, (DRISDOL) 1.25 MG (50000 UNIT) CAPS capsule Reorder     Meds ordered this encounter  Medications   DISCONTD: buPROPion (WELLBUTRIN SR) 150 MG 12 hr tablet    Sig: Take 1 tablet (150 mg total)  by mouth 2 (two) times daily.    Dispense:  60 tablet    Refill:  0   DISCONTD: Vitamin D, Ergocalciferol, (DRISDOL) 1.25 MG (50000 UNIT) CAPS capsule    Sig: Take 1 capsule (50,000 Units total) by mouth every 7 (seven) days.    Dispense:  4 capsule    Refill:  0     1. B12 deficiency Continue B12 daily, PNP and weight loss.  Recheck after 3 months of regular use.  2. Vitamin D deficiency After 3 months of consistent use, we will recheck vitamin D level.  Counseling done.  Refill- Vitamin D, Ergocalciferol, (DRISDOL) 1.25 MG (50000 UNIT) CAPS capsule; Take 1 capsule (50,000 Units total) by mouth every 7 (seven) days.  Dispense: 4 capsule; Refill: 0  3. Obstructive sleep apnea Counseling done.  4. Other depression with emotional eating Behavior modification techniques were discussed today to help [name] deal with her emotional/non-hunger eating behaviors.  Orders and follow up as documented in patient record.  Increase movements to help with mood and holiday stressors.  Refill- buPROPion (WELLBUTRIN SR) 150 MG 12 hr tablet; Take 1 tablet (150 mg total) by mouth 2 (two) times daily.  Dispense: 60 tablet; Refill: 0  5. Obesity, current BMI 47.0 Patient will contact insurance regarding weight loss and medication coverage.  Patient was on Trulicity in the past and terrible constipation. Wegovy and Mounjaro not covered by insurance.  With discount it was  still $500 per month.  Tuleen is currently in the action stage of change. As such, her goal is to continue with weight loss efforts. She has agreed to the Category 2 Plan and keeping a food journal and adhering to recommended goals of 1400-1500 calories and 100 protein.   Exercise goals: All adults should avoid inactivity. Some physical activity is better than none, and adults who participate in any amount of physical activity gain some health benefits.  Try to start something.  Behavioral modification strategies: increasing lean  protein intake, decreasing simple carbohydrates, celebration eating strategies, and avoiding temptations.  Merriel has agreed to follow-up with our clinic in 3 weeks. She was informed of the importance of frequent follow-up visits to maximize her success with intensive lifestyle modifications for her multiple health conditions.   Objective:   Blood pressure 137/83, pulse 80, temperature 98.3 F (36.8 C), height 5\' 4"  (1.626 m), weight 273 lb 9.6 oz (124.1 kg), SpO2 100 %. Body mass index is 46.96 kg/m.  General: Cooperative, alert, well developed, in no acute distress. HEENT: Conjunctivae and lids unremarkable. Cardiovascular: Regular rhythm.  Lungs: Normal work of breathing. Neurologic: No focal deficits.   Lab Results  Component Value Date   CREATININE 0.73 12/14/2021   BUN 16 12/14/2021   NA 142 12/14/2021   K 4.4 12/14/2021   CL 106 12/14/2021   CO2 22 12/14/2021   Lab Results  Component Value Date   ALT 9 12/14/2021   AST 12 12/14/2021   ALKPHOS 48 12/14/2021   BILITOT <0.2 12/14/2021   Lab Results  Component Value Date   HGBA1C 5.5 12/14/2021   HGBA1C 5.4 03/23/2021   Lab Results  Component Value Date   INSULIN 18.7 12/14/2021   INSULIN 16.6 03/23/2021   INSULIN 22.2 03/26/2020   Lab Results  Component Value Date   TSH 2.370 12/14/2021   Lab Results  Component Value Date   CHOL 203 (H) 12/14/2021   HDL 57 12/14/2021   LDLCALC 135 (H) 12/14/2021   TRIG 58 12/14/2021   Lab Results  Component Value Date   VD25OH 25.6 (L) 12/14/2021   VD25OH 29.7 (L) 03/23/2021   Lab Results  Component Value Date   WBC 5.1 12/14/2021   HGB 10.9 (L) 12/14/2021   HCT 34.6 12/14/2021   MCV 90 12/14/2021   PLT 322 12/14/2021   No results found for: "IRON", "TIBC", "FERRITIN"  Attestation Statements:   Reviewed by clinician on day of visit: allergies, medications, problem list, medical history, surgical history, family history, social history, and previous  encounter notes.  I, Davy Pique, RMA, am acting as Location manager for [employer], DO.  I have reviewed the above documentation for accuracy and completeness, and I agree with the above. Marjory Sneddon, D.O.  The [place] was signed into law in 2016 which includes the topic of electronic health records.  This provides immediate access to information in MyChart.  This includes consultation notes, operative notes, office notes, lab results and pathology reports.  If you have any questions about what you read please let us know at your next visit so we can discuss your concerns and take corrective action if need be.  We are right here with you.

## 2022-02-22 ENCOUNTER — Encounter (INDEPENDENT_AMBULATORY_CARE_PROVIDER_SITE_OTHER): Payer: Self-pay | Admitting: Family Medicine

## 2022-02-22 ENCOUNTER — Ambulatory Visit (INDEPENDENT_AMBULATORY_CARE_PROVIDER_SITE_OTHER): Payer: 59 | Admitting: Family Medicine

## 2022-02-22 VITALS — BP 116/78 | HR 83 | Temp 97.9°F | Ht 64.0 in | Wt 278.4 lb

## 2022-02-22 DIAGNOSIS — E559 Vitamin D deficiency, unspecified: Secondary | ICD-10-CM

## 2022-02-22 DIAGNOSIS — Z6841 Body Mass Index (BMI) 40.0 and over, adult: Secondary | ICD-10-CM

## 2022-02-22 DIAGNOSIS — F3289 Other specified depressive episodes: Secondary | ICD-10-CM

## 2022-02-22 DIAGNOSIS — E88819 Insulin resistance, unspecified: Secondary | ICD-10-CM

## 2022-02-22 DIAGNOSIS — E669 Obesity, unspecified: Secondary | ICD-10-CM

## 2022-02-22 MED ORDER — VITAMIN D (ERGOCALCIFEROL) 1.25 MG (50000 UNIT) PO CAPS: 50000.0000 [IU] | ORAL_CAPSULE | ORAL | 0 refills | Status: DC

## 2022-02-22 MED ORDER — METFORMIN HCL 500 MG PO TABS: ORAL_TABLET | ORAL | 0 refills | Status: DC

## 2022-02-22 MED ORDER — BUPROPION HCL ER (SR) 150 MG PO TB12: 150.0000 mg | ORAL_TABLET | Freq: Two times a day (BID) | ORAL | 0 refills | Status: DC

## 2022-03-15 NOTE — Progress Notes (Unsigned)
Chief Complaint:   OBESITY [name] is here to discuss her progress with her obesity treatment plan along with follow-up of her obesity related diagnoses. [name] is on the Category 2 Plan and states she is following her eating plan approximately 30% of the time. [name] states she is walking 30 minutes 2 times per week.  Today's visit was #: 63 Starting weight: 57 LBS Starting date: 03/26/2020 Today's weight: 278 LBS Today's date: 02/22/2022 Total lbs lost to date: 0 Total lbs lost since last in-office visit: +5 LBS  Interim History: Holidays which have not had many days where she fell off the wagon.  She does well for a while and then she falls off for several days.  Seems to be cyclical and not associated events etc.  Positive for carb cravings.  Subjective:   1. Insulin resistance ***  2. Vitamin D deficiency Last vitamin D level 2 months ago was 25.6, too low.  Patient was missing doses.  3. Other depression with emotional eating Patient reports stable mood.  Emotional eating under good control.  Assessment/Plan:   1. Insulin resistance Patient to call insurance regarding Wegovy and Ozempic.  Start- metFORMIN (GLUCOPHAGE) 500 MG tablet; 1 po with lunch daily  Dispense: 30 tablet; Refill: 0  2. Vitamin D deficiency Low Vitamin D level contributes to fatigue and are associated with obesity, breast, and colon cancer. She agrees to continue to take prescription Vitamin D @50 ,000 IU every week and will follow-up for routine testing of Vitamin D, at least 2-3 times per year to avoid over-replacement.  Refill- Vitamin D, Ergocalciferol, (DRISDOL) 1.25 MG (50000 UNIT) CAPS capsule; Take 1 capsule (50,000 Units total) by mouth every 7 (seven) days.  Dispense: 4 capsule; Refill: 0  3. Other depression with emotional eating Behavior modification techniques were discussed today to help [name] deal with her emotional/non-hunger eating behaviors.  Orders and follow up as  documented in patient record.   Refill- buPROPion (WELLBUTRIN SR) 150 MG 12 hr tablet; Take 1 tablet (150 mg total) by mouth 2 (two) times daily.  Dispense: 60 tablet; Refill: 0  4. Obesity, current BMI 47.8 Patient's goal is to meal plan, prep.  Recipes 1 and 2 given the patient.  Patient will bring in journaling log next visit.  [name] is currently in the action stage of change. As such, her goal is to continue with weight loss efforts. She has agreed to the Category 2 Plan.   Exercise goals:  As is.  Behavioral modification strategies: avoiding temptations and planning for success.  [name] has agreed to follow-up with our clinic in 3-4 weeks. She was informed of the importance of frequent follow-up visits to maximize her success with intensive lifestyle modifications for her multiple health conditions.   Objective:   Blood pressure 116/78, pulse 83, temperature 97.9 F (36.6 C), height 5\' 4"  (1.626 m), weight 278 lb 6.4 oz (126.3 kg), SpO2 98 %. Body mass index is 47.79 kg/m.  General: Cooperative, alert, well developed, in no acute distress. HEENT: Conjunctivae and lids unremarkable. Cardiovascular: Regular rhythm.  Lungs: Normal work of breathing. Neurologic: No focal deficits.   Lab Results  Component Value Date   CREATININE 0.73 12/14/2021   BUN 16 12/14/2021   NA 142 12/14/2021   K 4.4 12/14/2021   CL 106 12/14/2021   CO2 22 12/14/2021   Lab Results  Component Value Date   ALT 9 12/14/2021   AST 12 12/14/2021   ALKPHOS 48 12/14/2021  BILITOT <0.2 12/14/2021   Lab Results  Component Value Date   HGBA1C 5.5 12/14/2021   HGBA1C 5.4 03/23/2021   Lab Results  Component Value Date   INSULIN 18.7 12/14/2021   INSULIN 16.6 03/23/2021   INSULIN 22.2 03/26/2020   Lab Results  Component Value Date   TSH 2.370 12/14/2021   Lab Results  Component Value Date   CHOL 203 (H) 12/14/2021   HDL 57 12/14/2021   LDLCALC 135 (H) 12/14/2021   TRIG 58 12/14/2021    Lab Results  Component Value Date   VD25OH 25.6 (L) 12/14/2021   VD25OH 29.7 (L) 03/23/2021   Lab Results  Component Value Date   WBC 5.1 12/14/2021   HGB 10.9 (L) 12/14/2021   HCT 34.6 12/14/2021   MCV 90 12/14/2021   PLT 322 12/14/2021   No results found for: "IRON", "TIBC", "FERRITIN"  Attestation Statements:   Reviewed by clinician on day of visit: allergies, medications, problem list, medical history, surgical history, family history, social history, and previous encounter notes.  I, Davy Pique, RMA, am acting as Location manager for [employer], DO.  I have reviewed the above documentation for accuracy and completeness, and I agree with the above. -  ***

## 2022-03-16 ENCOUNTER — Other Ambulatory Visit (INDEPENDENT_AMBULATORY_CARE_PROVIDER_SITE_OTHER): Payer: Self-pay | Admitting: Family Medicine

## 2022-03-16 DIAGNOSIS — E88819 Insulin resistance, unspecified: Secondary | ICD-10-CM

## 2022-03-26 ENCOUNTER — Other Ambulatory Visit (INDEPENDENT_AMBULATORY_CARE_PROVIDER_SITE_OTHER): Payer: Self-pay | Admitting: Family Medicine

## 2022-03-26 DIAGNOSIS — F3289 Other specified depressive episodes: Secondary | ICD-10-CM

## 2022-04-01 ENCOUNTER — Telehealth (INDEPENDENT_AMBULATORY_CARE_PROVIDER_SITE_OTHER): Payer: Self-pay

## 2022-04-01 ENCOUNTER — Ambulatory Visit (INDEPENDENT_AMBULATORY_CARE_PROVIDER_SITE_OTHER): Payer: 59 | Admitting: Family Medicine

## 2022-04-01 ENCOUNTER — Encounter (INDEPENDENT_AMBULATORY_CARE_PROVIDER_SITE_OTHER): Payer: Self-pay | Admitting: Family Medicine

## 2022-04-01 VITALS — BP 123/78 | HR 80 | Temp 98.1°F | Ht 64.0 in | Wt 277.8 lb

## 2022-04-01 DIAGNOSIS — E88819 Insulin resistance, unspecified: Secondary | ICD-10-CM | POA: Diagnosis not present

## 2022-04-01 DIAGNOSIS — F3289 Other specified depressive episodes: Secondary | ICD-10-CM

## 2022-04-01 DIAGNOSIS — F39 Unspecified mood [affective] disorder: Secondary | ICD-10-CM

## 2022-04-01 DIAGNOSIS — Z6841 Body Mass Index (BMI) 40.0 and over, adult: Secondary | ICD-10-CM

## 2022-04-01 DIAGNOSIS — E538 Deficiency of other specified B group vitamins: Secondary | ICD-10-CM

## 2022-04-01 MED ORDER — BUPROPION HCL ER (SR) 150 MG PO TB12: 150.0000 mg | ORAL_TABLET | Freq: Two times a day (BID) | ORAL | 0 refills | Status: DC

## 2022-04-01 MED ORDER — METFORMIN HCL 500 MG PO TABS: ORAL_TABLET | ORAL | 0 refills | Status: DC

## 2022-04-01 MED ORDER — CYANOCOBALAMIN 500 MCG PO TABS: ORAL_TABLET | ORAL | Status: DC

## 2022-04-01 MED ORDER — TIRZEPATIDE 2.5 MG/0.5ML ~~LOC~~ SOAJ: 2.5000 mg | SUBCUTANEOUS | 0 refills | Status: DC

## 2022-04-01 NOTE — Telephone Encounter (Signed)
PRIOR AUTH SUBMITTED THRU [employer]  (Key: [facility]) Rx #: [id number] Mounjaro 2.5MG/0.5ML pen-injectors

## 2022-04-01 NOTE — Progress Notes (Signed)
Office: [phone]  /  Fax: [phone]    Date: 04/06/2022   Appointment Start Time: 11:57am Duration: 44 minutes Provider: Glennie Isle, Psy.D. Type of Session: Intake for Individual Therapy  Location of Patient: Parked in car at work (address obtained; safe/private location) Location of Provider: Provider's home (private office) Type of Contact: Telepsychological Visit via MyChart Video Visit  Informed Consent: Prior to proceeding with today's appointment, two pieces of identifying information were obtained. In addition, [name]'s physical location at the time of this appointment was obtained as well a phone number she could be reached at in the event of technical difficulties. Rayniya and this provider participated in today's telepsychological service.   The provider's role was explained to Jennette Dubin. The provider reviewed and discussed issues of confidentiality, privacy, and limits therein (e.g., reporting obligations). In addition to verbal informed consent, written informed consent for psychological services was obtained prior to the initial appointment. Since the clinic is not a 24/7 crisis center, mental health emergency resources were shared and this  provider explained MyChart, e-mail, voicemail, and/or other messaging systems should be utilized only for non-emergency reasons. This provider also explained that information obtained during appointments will be placed in [name]'s medical record and relevant information will be shared with other providers at [facility] for coordination of care. [name] agreed information may be shared with other [facility] providers as needed for coordination of care and by signing the service agreement document, she provided written consent for coordination of care. Prior to initiating telepsychological services, Glenn completed an informed consent document, which included the development of a safety plan (i.e.,  an emergency contact and emergency resources) in the event of an emergency/crisis. [name] verbally acknowledged understanding she is ultimately responsible for understanding her insurance benefits for telepsychological and in-person services. This provider also reviewed confidentiality, as it relates to telepsychological services. [name]  acknowledged understanding that appointments cannot be recorded without both party consent and she is aware she is responsible for securing confidentiality on her end of the session. [name] verbally consented to proceed.  Chief Complaint/HPI: [name] was referred by Dr. Mellody Dance due to other depression, with emotional eating on 04/01/2022.  During today's appointment, [name] shared she started with the clinic approximately two years ago, noting "limited success" due to "poor decision making." She was verbally administered a questionnaire assessing various behaviors related to emotional eating behaviors. [name] endorsed the following: overeat when you are celebrating, experience food cravings on a regular basis, use food to help you cope with emotional situations, find food is comforting to you, overeat when you are worried about something, overeat frequently when you are bored or lonely, and eat as a reward. She shared she craves sandwiches and sweets as of late. [name] stated she is unsure about the onset of emotional eating behaviors, but noted she feels she started experiencing challenges with losing weight after her third child was born. She described the current frequency of emotional eating behaviors as "weekly." In addition, [name] endorsed a history of binge eating behaviors. She explained going long periods without eating and then "overly consuming" in the evenings. [name] stated "tracking" her food intake and being more mindful has helped reduce the frequency. Clairene stated engaging in the aforementioned behaviors approximately twice a month.  [name] denied a history of significantly restricting food intake, purging and engagement in other compensatory strategies, and has never been diagnosed with an eating disorder. She also denied a history of treatment for emotional  and binge eating behaviors. 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 She discussed coping by engaging in mindfulness.   Mental Status Examination:  Appearance: neat Behavior: appropriate to circumstances Mood: neutral Affect: mood congruent Speech: WNL Eye Contact: appropriate Psychomotor Activity: WNL Gait: unable to assess  Thought Process: linear, logical, and goal directed and denies suicidal, homicidal, and self-harm ideation, plan and intent  Thought Content/Perception: no hallucinations, delusions, bizarre thinking or behavior endorsed or observed Orientation: AAOx4 Memory/Concentration: memory, attention, language, and fund of knowledge intact  Insight/Judgment: fair  Family & Psychosocial History: [name] reported she is married and she has three children (ages 44, 42, and 56). 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 Additionally, [name] shared her highest level of education obtained is an associate's degree. Currently, [name]'s social support system consists of her sister, mother, husband, and friends (work and church). Moreover, [name] stated she resides with her husband and two youngest children.   Medical History:  Past Medical History:  Diagnosis Date   Anemia    Anxiety    Back pain    Drug use    Edema of both lower extremities    Gallbladder problem    Headache    Migraines   History of kidney stones    IBS (irritable bowel syndrome)    Joint pain    Kidney stones    PCOS (polycystic ovarian syndrome)    PONV (postoperative nausea and vomiting)    Renal calculus or stone 04/01/2020   Sleep apnea    Vitamin D deficiency    Past  Surgical History:  Procedure Laterality Date   CHOLECYSTECTOMY     IUD REMOVAL N/A 12/25/2015   Procedure: INTRAUTERINE DEVICE (IUD) REMOVAL;  Surgeon: Chancy Milroy, MD;  Location: [facility];  Service: Gynecology;  Laterality: N/A;   LAPAROSCOPIC BILATERAL SALPINGECTOMY Bilateral 12/25/2015   Procedure: LAPAROSCOPIC BILATERAL SALPINGECTOMY;  Surgeon: Chancy Milroy, MD;  Location: [facility];  Service: Gynecology;  Laterality: Bilateral;   LIPOMA EXCISION     LITHOTRIPSY     URETEROSCOPY     Current Outpatient Medications on File Prior to Visit  Medication Sig Dispense Refill   buPROPion (WELLBUTRIN SR) 150 MG 12 hr tablet Take 1 tablet (150 mg total) by mouth 2 (two) times daily. 60 tablet 0   cyanocobalamin (VITAMIN B12) 500 MCG tablet 300-588mg once daily     furosemide (LASIX) 20 MG tablet Take 20 mg by mouth.     metFORMIN (GLUCOPHAGE) 500 MG tablet 1/2 po with lunch and dinner daily 30 tablet 0   tirzepatide (MOUNJARO) 2.5 MG/0.5ML Pen Inject 2.5 mg into the skin once a week. Q thursday 2 mL 0   traZODone (DESYREL) 50 MG tablet Take 50 mg by mouth at bedtime.     Vitamin D, Ergocalciferol, (DRISDOL) 1.25 MG (50000 UNIT) CAPS capsule Take 1 capsule (50,000 Units total) by mouth every 7 (seven) days. 4 capsule 0   No current facility-administered medications on file prior to visit.  JBrynleighstated she is medication compliant.   Mental Health History: JMakenliereported she has never attended therapeutic services. She stated she is currently prescribed Wellbutrin (prescribed by Dr. ORaliegh Scarlet and Trazodone (prescribed by her sleep doctor). JJenellareported there is no history of hospitalizations for psychiatric concerns. JLevonaendorsed a family history of alcoholism (father) and depression (mother). JAnabelenreported there is no history of trauma including psychological, physical ,  and sexual abuse, as well as neglect.   Reanne described her typical mood lately as "okay, pretty good."  She noted experiencing "random tearfulness" around her menstrual cycle. She agreed to follow-up with her gynecologist/PCP if necessary. She also discussed a history of experiencing panic attacks approximately two years ago due to ongoing stressors during the pandemic, adding she was prescribed Buspar by her PCP which she took for 6-8 months. Francyne denied current alcohol use.  She reported vaping nicotine daily. She denied illicit/recreational substance use. Furthermore, [name] indicated she is not experiencing the following: hallucinations and delusions, paranoia, symptoms of mania , social withdrawal, crying spells, memory concerns, attention and concentration issues, and obsessions and compulsions. She also denied history of and current suicidal ideation, plan, and intent; history of and current homicidal ideation, plan, and intent; and history of and current engagement in self-harm.  Legal History: Javaeh reported there is no history of legal involvement.   Structured Assessments Results: The Patient Health Questionnaire-9 (PHQ-9) is a self-report measure that assesses symptoms and severity of depression over the course of the last two weeks. Clint obtained a score of 2 suggesting minimal depression. Jackilyn finds the endorsed symptoms to be not difficult at all. [0= Not at all; 1= Several days; 2= More than half the days; 3= Nearly every day] Little interest or pleasure in doing things 0  Feeling down, depressed, or hopeless 0  Trouble falling or staying asleep, or sleeping too much 0  Feeling tired or having little energy 1  Poor appetite or overeating 1  Feeling bad about yourself --- or that you are a failure or have let yourself or your family down 0  Trouble concentrating on things, such as reading the newspaper or watching television 0  Moving or speaking so slowly that other people could have noticed? Or the opposite --- being so fidgety or restless that you have been moving around  a lot more than usual 0  Thoughts that you would be better off dead or hurting yourself in some way 0  PHQ-9 Score 2    The Generalized Anxiety Disorder-7 (GAD-7) is a brief self-report measure that assesses symptoms of anxiety over the course of the last two weeks. Rakiya obtained a score of 3 suggesting minimal anxiety. Clarissia finds the endorsed symptoms to be not difficult at all. [0= Not at all; 1= Several days; 2= Over half the days; 3= Nearly every day] Feeling nervous, anxious, on edge 1  Not being able to stop or control worrying 0  Worrying too much about different things 0  Trouble relaxing 1  Being so restless that it's hard to sit still 0  Becoming easily annoyed or irritable 1  Feeling afraid as if something awful might happen 0  GAD-7 Score 3   Interventions:  Conducted a chart review Focused on rapport building Verbally administered PHQ-9 and GAD-7 for symptom monitoring Verbally administered Food & Mood questionnaire to assess various behaviors related to emotional eating Provided emphatic reflections and validation Psychoeducation provided regarding physical versus emotional hunger  Diagnostic Impressions & Provisional DSM-5 Diagnosis(es): Lemoyne reported engagement in emotional eating behaviors "weekly," noting she is unsure of the onset. She also described engaging in binge eating behaviors approximately twice a month, noting tracking her food and being mindful has helped reduce frequency. She denied engagement in any other disordered eating behaviors. Based on the aforementioned, the following diagnosis was assigned: F50.89 Other Specified Feeding or Eating Disorder, Emotional and Binge Eating Behaviors.  Plan:  Janal appears able and willing to participate as evidenced by collaboration on a treatment goal, engagement in reciprocal conversation, and asking questions as needed for clarification. The next appointment is scheduled for 04/19/2022 at 8:30am, which will  be via MyChart Video Visit. The following treatment goal was established: increase coping skills. This provider will regularly review the treatment plan and medical chart to keep informed of status changes. Breezy expressed understanding and agreement with the initial treatment plan of care. Darlena will be sent a handout via e-mail to utilize between now and the next appointment to increase awareness of hunger patterns and subsequent eating. [name] provided verbal consent during today's appointment for this provider to send the handout via e-mail.

## 2022-04-01 NOTE — Telephone Encounter (Signed)
Denied today [id number]  Pt notified.

## 2022-04-06 ENCOUNTER — Telehealth (INDEPENDENT_AMBULATORY_CARE_PROVIDER_SITE_OTHER): Payer: 59 | Admitting: Psychology

## 2022-04-06 DIAGNOSIS — F5089 Other specified eating disorder: Secondary | ICD-10-CM | POA: Diagnosis not present

## 2022-04-14 NOTE — Progress Notes (Signed)
Chief Complaint:   OBESITY [name] is here to discuss her progress with her obesity treatment plan along with follow-up of her obesity related diagnoses. [name] is on the Category 2 Plan and keeping a food journal and adhering to recommended goals of 1400-1500 calories and 110 protein and states she is following her eating plan approximately 85% of the time. [name] states she is doing physical therapy, walking for 10-20 minutes 4 times per week.  Today's visit was #: 11 Starting weight: 78 LBS Starting date: 03/26/2020 Today's weight: 277 LBS Today's date: 04/01/2022 Total lbs lost to date: 0 Total lbs lost since last in-office visit: 1 LB  Interim History: Patient learned a lot with journaling very inconsistent with intake.  At times starting other times not hungry.  NSTEMI unable to tolerate especially with increased blood pressure in the past.  Subjective:   1. Insulin resistance Patient started metformin last office visit.  Patient did tolerate low-dose but had significant nausea and unable to tolerate therapeutic doses, no help.  2. B12 deficiency Improving fatigue slowly.  Patient denies any concerns.  3. Mood disorder ([place]), with emotional eating Patient is stable being more mindful but patient is aware that she does eat as a reward and has emotional eating tendencies.  Assessment/Plan:  No orders of the defined types were placed in this encounter.   Medications Discontinued During This Encounter  Medication Reason   cyanocobalamin (VITAMIN B12) 500 MCG tablet Reorder   buPROPion (WELLBUTRIN SR) 150 MG 12 hr tablet Reorder   metFORMIN (GLUCOPHAGE) 500 MG tablet Reorder     Meds ordered this encounter  Medications   buPROPion (WELLBUTRIN SR) 150 MG 12 hr tablet    Sig: Take 1 tablet (150 mg total) by mouth 2 (two) times daily.    Dispense:  60 tablet    Refill:  0   cyanocobalamin (VITAMIN B12) 500 MCG tablet    Sig: 300-559mg once daily   metFORMIN  (GLUCOPHAGE) 500 MG tablet    Sig: 1/2 po with lunch and dinner daily    Dispense:  30 tablet    Refill:  0    30 d supply;  ** OV for RF **   Do not send RF request   tirzepatide (MOUNJARO) 2.5 MG/0.5ML Pen    Sig: Inject 2.5 mg into the skin once a week. Q thursday    Dispense:  2 mL    Refill:  0     1. Insulin resistance This is a new diagnosis for [name] counseled patient on pathophysiology of the disease process of [name].R.   - Stressed importance of dietary and lifestyle modifications to result in weight loss as first line txmnt - In addition, we discussed the risks and benefits of various medication options which can help [place] the management of this disease process as well as with weight loss.  Will consider starting one of these meds in future as we will focus on prudent nutritional plan at this time.  - [name] to decrease simple carbs/ sugars; increase fiber and proteins -> follow her meal plan.   - Explained role of simple carbs and insulin levels on hunger and cravings - Handouts provided at pt's request after education provided.  All concerns/questions addressed.   - Anticipatory guidance given.   -[name] to work on weight loss, exercise, via their meal plan we devised to help decrease the risk of progressing to diabetes.  -  We will recheck A1c and fasting insulin level in approximately 3 months from last check, or as deemed appropriate.  Due to nauseated all the time [name] recommend she decrease the dose of metformin to 250 mg twice daily and refill.  We will write for Mounjaro 2.5 mg since per patient it is on her formulary but [name] did discuss with patient unlikely.  Decrease- metFORMIN (GLUCOPHAGE) 500 MG tablet; 1/2 po with lunch and dinner daily  Dispense: 30 tablet; Refill: 0  Refill- tirzepatide (MOUNJARO) 2.5 MG/0.5ML Pen; Inject 2.5 mg into the skin once a week. Q thursday  Dispense: 2 mL; Refill: 0  2. B12 deficiency - B12 level is not at  goal of over 500.  This diagnosis was reviewed with the patient and education was provided.   - We recommended patient start her prudent nutritional plan and also start OTC B12/ cyanocobalamin of [id number] mcg daily - We will [name] to monitor as deemed clinically necessary. - Counseling provided today, see below: - Your Vitamin B12 level is low. This vitamin is very important for the proper functioning of nerves. A vitamin B12 deficiency can cause anemia and neurologic symptoms such as tingling and pain in the hands and feet and possible cognitive problems. - Vitamin B12 is naturally present in foods of animal origin, including fish, meat, poultry, eggs, and dairy products.  - Causes of vitamin B12 deficiency include difficulty absorbing vitamin B12 from food, lack of intrinsic factor (e.g., because of pernicious anemia), surgery in the gastrointestinal tract, prolonged use of certain medications (e.g., metformin or proton pump inhibitors, etc and dietary deficiency.  - The effects of vitamin B12 deficiency:  can include the hallmark megaloblastic anemia, fatigue, heart palpitations; pale skin; dementia; and infertility.  Neurological changes, such as numbness and tingling in the hands and feet, can also occur.  These neurological symptoms can occur without anemia, so early diagnosis and intervention is important to avoid irreversible damage. In addition, some studies have found associations between vitamin B12 deficiency or low vitamin B12 intakes and depression. [name] B12 supplementation.  [name] PNP and B12 rich foods. Refill- cyanocobalamin (VITAMIN B12) 500 MCG tablet; 300-537mg once daily  3. Mood disorder ([place], with emotional eating Refill Wellbutrin at same dose.  Start with Dr. BMallie Musselreferral placed.  Recommend intuitive eating workbook.  Refill- buPROPion (WELLBUTRIN SR) 150 MG 12 hr tablet; Take 1 tablet (150 mg total) by mouth 2 (two) times daily.  Dispense: 60 tablet; Refill:  0  4. BMI 45.0-49.9, adult (HCC)-CURRENT BMI 47.7  5. Morbid obesity (HCC)-START BMI 51.49 Long discussion with patient regarding risk and benefits various weight loss medication shortages etc.  Was told not to take Topamax in the past due to kidney stones.  Refill- tirzepatide (MOUNJARO) 2.5 MG/0.5ML Pen; Inject 2.5 mg into the skin once a week. Q thursday  Dispense: 2 mL; Refill: 0  JAmahriis currently in the action stage of change. As such, her goal is to [name] with weight loss efforts. She has agreed to keeping a food journal and adhering to recommended goals of 1400-1500 calories and 110+ protein.   Exercise goals: All adults should avoid inactivity. Some physical activity is better than none, and adults who participate in any amount of physical activity gain some health benefits.  Increase activity as tolerated  Behavioral modification strategies: meal planning and cooking strategies.  JKatrinehas agreed to follow-up with our clinic in 4 weeks. She was informed of the importance of frequent follow-up visits to  maximize her success with intensive lifestyle modifications for her multiple health conditions.   Objective:   Blood pressure 123/78, pulse 80, temperature 98.1 F (36.7 C), height '5\' 4"'$  (1.626 m), weight 277 lb 12.8 oz (126 kg), SpO2 99 %. Body mass index is 47.68 kg/m.  General: Cooperative, alert, well developed, in no acute distress. HEENT: Conjunctivae and lids unremarkable. Cardiovascular: Regular rhythm.  Lungs: Normal work of breathing. Neurologic: No focal deficits.   Lab Results  Component Value Date   CREATININE 0.73 12/14/2021   BUN 16 12/14/2021   NA 142 12/14/2021   K 4.4 12/14/2021   CL 106 12/14/2021   CO2 22 12/14/2021   Lab Results  Component Value Date   ALT 9 12/14/2021   AST 12 12/14/2021   ALKPHOS 48 12/14/2021   BILITOT <0.2 12/14/2021   Lab Results  Component Value Date   HGBA1C 5.5 12/14/2021   HGBA1C 5.4 03/23/2021   Lab  Results  Component Value Date   INSULIN 18.7 12/14/2021   INSULIN 16.6 03/23/2021   INSULIN 22.2 03/26/2020   Lab Results  Component Value Date   TSH 2.370 12/14/2021   Lab Results  Component Value Date   CHOL 203 (H) 12/14/2021   HDL 57 12/14/2021   LDLCALC 135 (H) 12/14/2021   TRIG 58 12/14/2021   Lab Results  Component Value Date   VD25OH 25.6 (L) 12/14/2021   VD25OH 29.7 (L) 03/23/2021   Lab Results  Component Value Date   WBC 5.1 12/14/2021   HGB 10.9 (L) 12/14/2021   HCT 34.6 12/14/2021   MCV 90 12/14/2021   PLT 322 12/14/2021   No results found for: "IRON", "TIBC", "FERRITIN"  Attestation Statements:   Reviewed by clinician on day of visit: allergies, medications, problem list, medical history, surgical history, family history, social history, and previous encounter notes.  Time spent on visit including pre-visit chart review and post-visit care and charting was 40 minutes.   [name], Davy Pique, RMA, am acting as Location manager for [employer], DO.   [name] have reviewed the above documentation for accuracy and completeness, and [name] agree with the above. Marjory Sneddon, D.O.  The [place] was signed into law in 2016 which includes the topic of electronic health records.  This provides immediate access to information in MyChart.  This includes consultation notes, operative notes, office notes, lab results and pathology reports.  If you have any questions about what you read please let us know at your next visit so we can discuss your concerns and take corrective action if need be.  We are right here with you.

## 2022-04-19 ENCOUNTER — Telehealth (INDEPENDENT_AMBULATORY_CARE_PROVIDER_SITE_OTHER): Payer: 59 | Admitting: Psychology

## 2022-04-19 DIAGNOSIS — F5089 Other specified eating disorder: Secondary | ICD-10-CM

## 2022-04-19 NOTE — Progress Notes (Signed)
  Office: [phone]  /  Fax: [phone]    Date: April 19, 2022    Appointment Start Time: 8:30am Duration: 31 minutes Provider: Glennie Isle, Psy.D. Type of Session: Individual Therapy  Location of Patient: Home (private location) Location of Provider: Provider's Home (private office) Type of Contact: Telepsychological Visit via MyChart Video Visit  Session Content: [name] is a 42 y.o. female presenting for a follow-up appointment to address the previously established treatment goal of increasing coping skills.Today's appointment was a telepsychological visit. [name] provided verbal consent for today's telepsychological appointment and she is aware she is responsible for securing confidentiality on her end of the session. Prior to proceeding with today's appointment, [name]'s physical location at the time of this appointment was obtained as well a phone number she could be reached at in the event of technical difficulties. [name] and this provider participated in today's telepsychological service.   This provider conducted a brief check-in. [name] stated she reflected on emotional and physical hunger cues, noting she engaged in emotional eating behaviors secondary to stress. Further explored and processed. Discussed the importance of protein intake. Psychoeducation provided regarding all or nothing thinking. Briefly discussed stress management. Overall, [name] was receptive to today's appointment as evidenced by openness to sharing, responsiveness to feedback, and willingness to implement discussed strategies .  Mental Status Examination:  Appearance: neat Behavior: appropriate to circumstances Mood: neutral Affect: mood congruent Speech: WNL Eye Contact: appropriate Psychomotor Activity: WNL Gait: unable to assess Thought Process: linear, logical, and goal directed and no evidence or endorsement of suicidal, homicidal, and self-harm ideation, plan and intent  Thought  Content/Perception: no hallucinations, delusions, bizarre thinking or behavior endorsed or observed Orientation: AAOx4 Memory/Concentration: memory, attention, language, and fund of knowledge intact  Insight: fair Judgment: fair  Interventions:  Conducted a brief chart review Provided empathic reflections and validation Reviewed content from the previous session Provided positive reinforcement Employed supportive psychotherapy interventions to facilitate reduced distress and to improve coping skills with identified stressors Engaged patient in problem solving Psychoeducation provided regarding all-or-nothing thinking  DSM-5 Diagnosis(es): F50.89 Other Specified Feeding or Eating Disorder, Emotional and Binge Eating Behaviors  Treatment Goal & Progress: During the initial appointment with this provider, the following treatment goal was established: increase coping skills. [name] has demonstrated some progress in her goal as evidenced by increased awareness of hunger patterns.   Plan: The next appointment is scheduled for 05/03/2022 at 8:30am, which will be via MyChart Video Visit. The next session will focus on working towards the established treatment goal.

## 2022-04-29 ENCOUNTER — Encounter (INDEPENDENT_AMBULATORY_CARE_PROVIDER_SITE_OTHER): Payer: Self-pay | Admitting: Family Medicine

## 2022-04-29 ENCOUNTER — Ambulatory Visit (INDEPENDENT_AMBULATORY_CARE_PROVIDER_SITE_OTHER): Payer: 59 | Admitting: Family Medicine

## 2022-04-29 VITALS — BP 139/81 | HR 89 | Temp 98.4°F | Ht 64.0 in | Wt 273.0 lb

## 2022-04-29 DIAGNOSIS — E88819 Insulin resistance, unspecified: Secondary | ICD-10-CM | POA: Diagnosis not present

## 2022-04-29 DIAGNOSIS — F39 Unspecified mood [affective] disorder: Secondary | ICD-10-CM

## 2022-04-29 DIAGNOSIS — R632 Polyphagia: Secondary | ICD-10-CM

## 2022-04-29 DIAGNOSIS — E559 Vitamin D deficiency, unspecified: Secondary | ICD-10-CM

## 2022-04-29 DIAGNOSIS — Z6841 Body Mass Index (BMI) 40.0 and over, adult: Secondary | ICD-10-CM

## 2022-04-29 DIAGNOSIS — E538 Deficiency of other specified B group vitamins: Secondary | ICD-10-CM

## 2022-04-29 MED ORDER — VITAMIN D (ERGOCALCIFEROL) 1.25 MG (50000 UNIT) PO CAPS: 50000.0000 [IU] | ORAL_CAPSULE | ORAL | 0 refills | Status: DC

## 2022-04-29 MED ORDER — CYANOCOBALAMIN 500 MCG PO TABS: ORAL_TABLET | ORAL | Status: DC

## 2022-04-29 MED ORDER — OZEMPIC (0.25 OR 0.5 MG/DOSE) 2 MG/1.5ML ~~LOC~~ SOPN: 0.5000 mg | PEN_INJECTOR | SUBCUTANEOUS | 0 refills | Status: DC

## 2022-04-29 MED ORDER — BUPROPION HCL ER (SR) 150 MG PO TB12: 150.0000 mg | ORAL_TABLET | Freq: Two times a day (BID) | ORAL | 0 refills | Status: DC

## 2022-04-29 NOTE — Progress Notes (Signed)
[name], D.O.  ABFM, ABOM Specializing in Clinical Bariatric Medicine  Office located at: [address] [place], [place]  [address]     Assessment and Plan:   No orders of the defined types were placed in this encounter.   Medications Discontinued During This Encounter  Medication Reason   Vitamin D, Ergocalciferol, (DRISDOL) 1.25 MG (50000 UNIT) CAPS capsule Reorder   buPROPion (WELLBUTRIN SR) 150 MG 12 hr tablet Reorder   cyanocobalamin (VITAMIN B12) 500 MCG tablet Reorder     Meds ordered this encounter  Medications   buPROPion (WELLBUTRIN SR) 150 MG 12 hr tablet    Sig: Take 1 tablet (150 mg total) by mouth 2 (two) times daily.    Dispense:  60 tablet    Refill:  0   Vitamin D, Ergocalciferol, (DRISDOL) 1.25 MG (50000 UNIT) CAPS capsule    Sig: Take 1 capsule (50,000 Units total) by mouth every 7 (seven) days.    Dispense:  4 capsule    Refill:  0   Semaglutide,0.25 or 0.5MG /DOS, (OZEMPIC, 0.25 OR 0.5 MG/DOSE,) 2 MG/1.5ML SOPN    Sig: Inject 0.5 mg into the skin once a week.    Dispense:  1.5 mL    Refill:  0   cyanocobalamin (VITAMIN B12) 500 MCG tablet    Sig: 300-534mcg once daily    Fasting blood work Next OV.  Insulin resistance Assessment: Condition is Improving, but not optimized.. Labs were reviewed.  Lab Results  Component Value Date   HGBA1C 5.5 12/14/2021   HGBA1C 5.4 03/23/2021   INSULIN 18.7 12/14/2021   INSULIN 16.6 03/23/2021   INSULIN 22.2 03/26/2020  Patient compliant with Metformin 500 mg daily. When she takes the metformin at night , she reports having mild loose stools in the morning.   Plan: Discontinue Metformin 500 mg.    Polyphagia  Assessment: Condition is Not optimized.. Labs were reviewed.  She states that is not hungry during the day, which causes her to skipmeals, and then feel very hungry at night. She craves sweets the most. She has been speaking with Dr.Barker every two weeks about this issue and ways to  tackle it. She says that meeting with Dr.Barker is helping.   Plan: Continue meeting with Dr.Barker to manage the polyphagia   Mood disorder (HCC), with emotional eating Assessment: Condition is well managed.  Patient is compliant will Wellbutrin 150mg  BID, denies any side effects  Plan: Continue meeting with Dr.Barker for emotional eating and continue Wellbutrin 150mg  BID.    Vitamin D deficiency Assessment: Condition is Not optimized.. Labs were reviewed.  Lab Results  Component Value Date   VD25OH 25.6 (L) 12/14/2021   VD25OH 29.7 (L) 03/23/2021  Patient compliant with Ergocalciferol 50K IU weekly, denies any side effects.   Plan: Continue with Ergocalciferol 50K IU weekly. Will refill Vitamin D today.   - weight loss will likely improve availability of vitamin D, thus encouraged [name] to continue with meal plan and their weight loss efforts to further improve this condition.  Thus, we will need to monitor levels regularly (every 3-4 mo on average) to keep levels within normal limits and prevent over supplementation.   Vitamin  B12 Deficieny  Assessment: Condition is Improving, but not optimized.. Labs were reviewed.  Lab Results  Component Value Date   VITAMINB12 348 12/14/2021  Patient is compliant with Vitamin B12 300-500 mcg daily, denies any side effects.   Plan: Continue with Vitamin B12 300-500 mcg daily.  We will continue to monitor as deemed clinically necessary.  TREATMENT PLAN FOR OBESITY:  BMI 45.0-49.9, adult (HCC)-CURRENT BMI 47.7  Morbid obesity (HCC)-START BMI 51.49/DATE 03/26/20 Assessment: Condition is At goal.. Biometric data collected today, was reviewed with patient.  Fat mass has decreased by 6.6lb. Muscle mass has increased by 1.8lb. Her insurance did not cover Mounjaro.   Plan: Continue with pilates and walking; continue prudent nutritional plan. We have ordered/started her Ozempic and we will see if it gets covered by her insurance.     Recommended Dietary Goals [name] is currently in the action stage of change. As such, her goal is to continue weight management plan. She has agreed to continue keeping a food journal and adhering to recommended goals of 1400-1500 calories and 100+ protein.  Behavioral Intervention Additional resources provided today: food journal handout Evidence-based interventions for health behavior change were utilized today including the discussion of self monitoring techniques, problem-solving barriers and SMART goal setting techniques.   Regarding patient's less desirable eating habits and patterns, we employed the technique of small changes.  Pt will specifically work on: write down food in food journal handout for next visit.    Recommended Physical Activity Goals [name] has been advised to work up to 150 minutes of moderate intensity aerobic activity a week and strengthening exercises 2-3 times per week for cardiovascular health, weight loss maintenance and preservation of muscle mass.  She has agreed to Continue current level of physical activity   FOLLOW UP: Return in about 4 weeks (around 05/27/2022). She was informed of the importance of frequent follow up visits to maximize her success with intensive lifestyle modifications for her multiple health conditions.  Subjective:   Chief complaint: Obesity [name] is here to discuss her progress with her obesity treatment plan. She is on the keeping a food journal and adhering to recommended goals of 1400-1500 calories and 110+ protein and states she is following her eating plan approximately 85% of the time. She states she is exercising by doing pilates and walking. She does this 10-20 minutes 4-5 days per week.  Interval History:  [name] is here for a follow up office visit. We reviewed her meal plan and all questions were answered. Patient's food recall appears to be accurate and consistent with what is on plan when she is  following it. When eating on plan, her hunger and cravings are well controlled.     Since last office visit she is doing well but reports feeling hungry frequently. She is especially craving sweets.During the day, she sometimes skips meals, thus causing her to feel hungry at night. She has been journaling her foods. Sometimes she is under or over her caloric range. She says that she has been under stress due to her mother being in the hospital.  She sometimes journals when she eats, sometimes the journaling takes place at night.    Review of Systems:  Pertinent positives were addressed with patient today.  Weight Summary and Biometrics   See flowsheets from today for details   Objective:   PHYSICAL EXAM:  Blood pressure 139/81, pulse 89, temperature 98.4 F (36.9 C), height 5\' 4"  (1.626 m), weight 273 lb (123.8 kg), SpO2 95 %. Body mass index is 46.86 kg/m.  General: Well Developed, well nourished, and in no acute distress.  HEENT: Normocephalic, atraumatic Skin: Warm and dry, cap RF less 2 sec, good turgor Chest:  Normal excursion, shape, no gross abn Respiratory: speaking in full sentences, no conversational  dyspnea NeuroM-Sk: Ambulates w/o assistance, moves * 4 Psych: A and O *3, insight good, mood-full  DIAGNOSTIC DATA REVIEWED:  BMET    Component Value Date/Time   NA 142 12/14/2021 0828   NA 138 10/14/2011 0752   K 4.4 12/14/2021 0828   K 4.1 10/14/2011 0752   CL 106 12/14/2021 0828   CL 106 10/14/2011 0752   CO2 22 12/14/2021 0828   CO2 27 10/14/2011 0752   GLUCOSE 85 12/14/2021 0828   GLUCOSE 87 04/16/2016 1123   GLUCOSE 90 10/14/2011 0752   BUN 16 12/14/2021 0828   BUN 11 10/14/2011 0752   CREATININE 0.73 12/14/2021 0828   CREATININE 0.55 (L) 10/14/2011 0752   CALCIUM 9.0 12/14/2021 0828   CALCIUM 9.0 10/14/2011 0752   GFRNONAA >90 01/29/2014 1950   GFRNONAA >60 10/14/2011 0752   GFRAA >90 01/29/2014 1950   GFRAA >60 10/14/2011 0752   Lab Results   Component Value Date   HGBA1C 5.5 12/14/2021   HGBA1C 5.4 03/23/2021   Lab Results  Component Value Date   INSULIN 18.7 12/14/2021   INSULIN 22.2 03/26/2020   Lab Results  Component Value Date   TSH 2.370 12/14/2021   CBC    Component Value Date/Time   WBC 5.1 12/14/2021 0828   WBC 8.0 12/15/2015 0905   RBC 3.86 12/14/2021 0828   RBC 3.86 (L) 12/15/2015 0905   HGB 10.9 (L) 12/14/2021 0828   HCT 34.6 12/14/2021 0828   PLT 322 12/14/2021 0828   MCV 90 12/14/2021 0828   MCV 91 10/14/2011 0752   MCH 28.2 12/14/2021 0828   MCH 29.8 12/15/2015 0905   MCHC 31.5 12/14/2021 0828   MCHC 33.0 12/15/2015 0905   RDW 13.6 12/14/2021 0828   RDW 14.1 10/14/2011 0752   Iron Studies No results found for: "IRON", "TIBC", "FERRITIN", "IRONPCTSAT" Lipid Panel     Component Value Date/Time   CHOL 203 (H) 12/14/2021 0828   TRIG 58 12/14/2021 0828   HDL 57 12/14/2021 0828   LDLCALC 135 (H) 12/14/2021 0828   Hepatic Function Panel     Component Value Date/Time   PROT 6.4 12/14/2021 0828   PROT 7.4 10/14/2011 0752   ALBUMIN 4.0 12/14/2021 0828   ALBUMIN 3.8 10/14/2011 0752   AST 12 12/14/2021 0828   AST 8 (L) 10/14/2011 0752   ALT 9 12/14/2021 0828   ALT 13 10/14/2011 0752   ALKPHOS 48 12/14/2021 0828   ALKPHOS 48 (L) 10/14/2011 0752   BILITOT <0.2 12/14/2021 0828   BILITOT 0.4 10/14/2011 0752      Component Value Date/Time   TSH 2.370 12/14/2021 0828   Nutritional Lab Results  Component Value Date   VD25OH 25.6 (L) 12/14/2021   VD25OH 29.7 (L) 03/23/2021    Attestations:   Reviewed by clinician on day of visit: allergies, medications, problem list, medical history, surgical history, family history, social history, and previous encounter notes.   Patient was in the office today and time spent on visit including pre-visit chart review and post-visit care/coordination of care and electronic medical record documentation was 40 minutes. 50% of the time was in face to face  counseling of this patient's medical condition(s) and providing education on treatment options to include the first-line treatment of diet and lifestyle modification.  I,Special Puri,acting as a scribe for [employer], DO.,have documented all relevant documentation on the behalf of Tiffany Lot, DO,as directed by  Tiffany Lot, DO while in the presence of [employer], DO.  Andrena Mews, DO, have reviewed all documentation for this visit. The documentation on 05/04/22 for the exam, diagnosis, procedures, and orders are all accurate and complete.

## 2022-05-03 ENCOUNTER — Telehealth (INDEPENDENT_AMBULATORY_CARE_PROVIDER_SITE_OTHER): Payer: 59 | Admitting: Psychology

## 2022-05-03 DIAGNOSIS — F5089 Other specified eating disorder: Secondary | ICD-10-CM

## 2022-05-03 NOTE — Progress Notes (Signed)
  Office: [phone]  /  Fax: [phone]    Date: May 03, 2022    Appointment Start Time: 8:32am Duration: 29 minutes Provider: Glennie Isle, Psy.D. Type of Session: Individual Therapy  Location of Patient: Parked in car at [facility] (private/safe location) Location of Provider: Provider's Home (private office) Type of Contact: Telepsychological Visit via MyChart Video Visit  Session Content: [name] is a 42 y.o. female presenting for a follow-up appointment to address the previously established treatment goal of increasing coping skills.Today's appointment was a telepsychological visit. [name] provided verbal consent for today's telepsychological appointment and she is aware she is responsible for securing confidentiality on her end of the session. Prior to proceeding with today's appointment, [name]'s physical location at the time of this appointment was obtained as well a phone number she could be reached at in the event of technical difficulties. [name] and this provider participated in today's telepsychological service.   This provider conducted a brief check-in. [name] shared about recent events/stressors. Regarding eatings habits/weight loss, she indicated she lost four pounds but acknowledged she continues to "struggle" with eating regularly. Processed associated thoughts/feelings. Discussed consequences of not eating regularly, especially as it relates to binge eating behaviors. Reviewed all or nothing thinking. She was engaged in problem solving to ensure she is eating regularly. Overall, [name] was receptive to today's appointment as evidenced by openness to sharing, responsiveness to feedback, and willingness to implement discussed strategies .  Mental Status Examination:  Appearance: neat Behavior: appropriate to circumstances Mood: neutral Affect: mood congruent Speech: WNL Eye Contact: appropriate Psychomotor Activity: WNL Gait: unable to assess Thought  Process: linear, logical, and goal directed and no evidence or endorsement of suicidal, homicidal, and self-harm ideation, plan and intent  Thought Content/Perception: no hallucinations, delusions, bizarre thinking or behavior endorsed or observed Orientation: AAOx4 Memory/Concentration: memory, attention, language, and fund of knowledge intact  Insight: fair Judgment: fair  Interventions:  Conducted a brief chart review Provided empathic reflections and validation Reviewed content from the previous session Provided positive reinforcement Employed supportive psychotherapy interventions to facilitate reduced distress and to improve coping skills with identified stressors Engaged patient in problem solving  DSM-5 Diagnosis(es): F50.89 Other Specified Feeding or Eating Disorder, Emotional and Binge Eating Behaviors  Treatment Goal & Progress: During the initial appointment with this provider, the following treatment goal was established: increase coping skills. [name] described increased awareness of hunger patterns.   Plan: The next appointment is scheduled for 05/17/2022 at 8:30am, which will be via MyChart Video Visit. The next session will focus on working towards the established treatment goal.

## 2022-05-06 ENCOUNTER — Encounter (INDEPENDENT_AMBULATORY_CARE_PROVIDER_SITE_OTHER): Payer: Self-pay | Admitting: Family Medicine

## 2022-05-10 ENCOUNTER — Telehealth (INDEPENDENT_AMBULATORY_CARE_PROVIDER_SITE_OTHER): Payer: Self-pay | Admitting: Family Medicine

## 2022-05-10 NOTE — Telephone Encounter (Signed)
(  Key: [id number]) Ozempic (0.25 or 0.5 MG/DOSE) 2MG /3ML pen-injectors

## 2022-05-10 NOTE — Telephone Encounter (Signed)
Denied, reason: no dx of diabetes 2.

## 2022-05-10 NOTE — Telephone Encounter (Signed)
PA for Ozempic started.

## 2022-05-17 ENCOUNTER — Telehealth (INDEPENDENT_AMBULATORY_CARE_PROVIDER_SITE_OTHER): Payer: 59 | Admitting: Psychology

## 2022-05-17 DIAGNOSIS — F5089 Other specified eating disorder: Secondary | ICD-10-CM

## 2022-05-17 NOTE — Progress Notes (Signed)
  Office: [phone]  /  Fax: [phone]    Date: May 17, 2022    Appointment Start Time: 8:29am Duration: 32 minutes Provider: Glennie Isle, Psy.D. Type of Session: Individual Therapy  Location of Patient: Home (private location) Location of Provider: Provider's Home (private office) Type of Contact: Telepsychological Visit via MyChart Video Visit  Session Content: [name] is a 42 y.o. female presenting for a follow-up appointment to address the previously established treatment goal of increasing coping skills.Today's appointment was a telepsychological visit. [name] provided verbal consent for today's telepsychological appointment and she is aware she is responsible for securing confidentiality on her end of the session. Prior to proceeding with today's appointment, [name]'s physical location at the time of this appointment was obtained as well a phone number she could be reached at in the event of technical difficulties. [name] and this provider participated in today's telepsychological service.   This provider conducted a brief check-in. [name] shared about recent events, including a beach vacation and some challenges with eating habits. Notably, she reported implementing discussed strategies. Psychoeducation regarding triggers for emotional eating was provided. [name] was provided a handout, and encouraged to utilize the handout between now and the next appointment to increase awareness of triggers and frequency. [name] agreed. This provider also discussed behavioral strategies for specific triggers, such as placing the utensil down when conversing to avoid mindless eating. [name] provided verbal consent during today's appointment for this provider to send a handout about triggers via e-mail. Overall, [name] was receptive to today's appointment as evidenced by openness to sharing, responsiveness to feedback, and willingness to explore triggers for emotional  eating.  Mental Status Examination:  Appearance: neat Behavior: appropriate to circumstances Mood: neutral Affect: mood congruent Speech: WNL Eye Contact: appropriate Psychomotor Activity: WNL Gait: unable to assess Thought Process: linear, logical, and goal directed and no evidence or endorsement of suicidal, homicidal, and self-harm ideation, plan and intent  Thought Content/Perception: no hallucinations, delusions, bizarre thinking or behavior endorsed or observed Orientation: AAOx4 Memory/Concentration: memory, attention, language, and fund of knowledge intact  Insight: fair Judgment: fair  Interventions:  Conducted a brief chart review Provided empathic reflections and validation Reviewed content from the previous session Provided positive reinforcement Employed supportive psychotherapy interventions to facilitate reduced distress and to improve coping skills with identified stressors Psychoeducation provided regarding triggers for emotional eating behaviors  DSM-5 Diagnosis(es): F50.89 Other Specified Feeding or Eating Disorder, Emotional and Binge Eating Behaviors  Treatment Goal & Progress: During the initial appointment with this provider, the following treatment goal was established: increase coping skills. [name] has demonstrated progress in her goal as evidenced by increased awareness of hunger patterns and increased awareness of triggers for emotional eating behaviors. [name] also continues to demonstrate willingness to engage in learned skill(s).  Plan: The next appointment is scheduled for 06/07/2022 at 11am, which will be via MyChart Video Visit. The next session will focus on working towards the established treatment goal.

## 2022-05-27 ENCOUNTER — Telehealth (INDEPENDENT_AMBULATORY_CARE_PROVIDER_SITE_OTHER): Payer: Self-pay

## 2022-05-27 ENCOUNTER — Ambulatory Visit (INDEPENDENT_AMBULATORY_CARE_PROVIDER_SITE_OTHER): Payer: 59 | Admitting: Family Medicine

## 2022-05-27 ENCOUNTER — Encounter (INDEPENDENT_AMBULATORY_CARE_PROVIDER_SITE_OTHER): Payer: Self-pay | Admitting: Family Medicine

## 2022-05-27 VITALS — BP 140/82 | HR 75 | Temp 97.7°F | Ht 64.0 in | Wt 274.0 lb

## 2022-05-27 DIAGNOSIS — F39 Unspecified mood [affective] disorder: Secondary | ICD-10-CM

## 2022-05-27 DIAGNOSIS — Z6841 Body Mass Index (BMI) 40.0 and over, adult: Secondary | ICD-10-CM

## 2022-05-27 DIAGNOSIS — E559 Vitamin D deficiency, unspecified: Secondary | ICD-10-CM

## 2022-05-27 MED ORDER — VITAMIN D (ERGOCALCIFEROL) 1.25 MG (50000 UNIT) PO CAPS: 50000.0000 [IU] | ORAL_CAPSULE | ORAL | 0 refills | Status: DC

## 2022-05-27 MED ORDER — BUPROPION HCL ER (SR) 150 MG PO TB12: 150.0000 mg | ORAL_TABLET | Freq: Two times a day (BID) | ORAL | 0 refills | Status: DC

## 2022-05-27 NOTE — Telephone Encounter (Signed)
Request for new prior auth for Ozempic thru cover my meds submitted.  (Key: [id number]) Rx #: [id number] Ozempic (0.25 or 0.5 MG/DOSE) 2MG /3ML pen-injectors

## 2022-05-27 NOTE — Progress Notes (Signed)
[name], D.O.  ABFM, ABOM Specializing in Clinical Bariatric Medicine  Office located at: [address] [place], [place]  [address]     Assessment and Plan:   No orders of the defined types were placed in this encounter.   Medications Discontinued During This Encounter  Medication Reason   buPROPion (WELLBUTRIN SR) 150 MG 12 hr tablet Reorder   Vitamin D, Ergocalciferol, (DRISDOL) 1.25 MG (50000 UNIT) CAPS capsule Reorder     Meds ordered this encounter  Medications   buPROPion (WELLBUTRIN SR) 150 MG 12 hr tablet    Sig: Take 1 tablet (150 mg total) by mouth 2 (two) times daily.    Dispense:  60 tablet    Refill:  0   Vitamin D, Ergocalciferol, (DRISDOL) 1.25 MG (50000 UNIT) CAPS capsule    Sig: Take 1 capsule (50,000 Units total) by mouth every 7 (seven) days.    Dispense:  4 capsule    Refill:  0     Mood disorder (HCC), with emotional eating Assessment: Condition is well managed.  Mood is stable. No SI/HI. No issues with Wellbutrin 150 mg BID, denies any side effects.  Plan: Will refill this today.  Continue with med. Continue meeting with Dr.Barker. Continue her prudent nutritional plan that is low in simple carbohydrates, saturated fats and trans fats to goal of 5-10% weight loss to achieve significant health benefits.  Pt encouraged to continually advance exercise and cardiovascular fitness as tolerated throughout weight loss journey.     Vitamin D deficiency Assessment: Condition is Not optimized. Lab Results  Component Value Date   VD25OH 25.6 (L) 12/14/2021   VD25OH 29.7 (L) 03/23/2021  Patient has been more compliant with Ergocalciferol 50K IU weekly. Denies any side effects.  Plan: Will refill this today. Continue with med.  - weight loss will likely improve availability of vitamin D, thus encouraged Ashantay to continue with meal plan and their weight loss efforts to further improve this condition.  Thus, we will need to monitor levels  regularly (every 3-4 mo on average) to keep levels within normal limits and prevent over supplementation.  TREATMENT PLAN FOR OBESITY: BMI 45.0-49.9, adult (HCC)-CURRENT BMI 47.0 Morbid obesity (HCC)-START BMI 51.49/DATE 03/26/20 Assessment: Condition is not optimized. Biometric data collected today, was reviewed with patient.  Fat mass has increased by 3.8lb. Muscle mass has decreased by 2.6lb. She has lost 27 lbs coming into days visit.   Plan:  Mabelle is currently in the action stage of change. As such, her goal is to continue weight management plan. Eilley will work on healthier eating habits and try their best to continue with keeping a food journal and adhering to recommended goals of 1400-1500 calories and 100 protein with Category 3 has a guide. Counseling was done:  -I reviewed her meal plan with her to show how to get in all her protein daily.  -I showed her the eating out guide so that she can try to make healthier choices when eating out. I recommended her to not eat out more than 2x a week.  -I emphasized the importance of exercising for weight loss.   Behavioral Intervention Additional resources provided today: category 3 meal plan information, Food journaling plan information, and Eating out guide .  Evidence-based interventions for health behavior change were utilized today including the discussion of self monitoring techniques, problem-solving barriers and SMART goal setting techniques.   Regarding patient's less desirable eating habits and patterns, we employed the technique  of small changes.  Pt will specifically work on: eating 3 meals a day and not skipping any meals for next visit.    Recommended Physical Activity Goals Victorino DikeJennifer has been advised to work up to 150 minutes of moderate intensity aerobic activity a week and strengthening exercises 2-3 times per week for cardiovascular health, weight loss maintenance and preservation of muscle mass.  She has agreed to  Continue current level of physical activity   FOLLOW UP: Return in about 2 weeks (around 06/10/2022). She was informed of the importance of frequent follow up visits to maximize her success with intensive lifestyle modifications for her multiple health conditions.   Subjective:   Chief complaint: Obesity Victorino DikeJennifer is here to discuss her progress with her obesity treatment plan. She is on the keeping a food journal and adhering to recommended goals of 1400-1500 calories and 100 protein with Category 3 as a guide and states she is following her eating plan approximately 90% of the time. She states she is not exercising.  Interval History:  Rhetta MuraJennifer R Friedel is here for a follow up office visit. Since the last office visit, she has been frustrated with her recent weight gain. She endorses that journaling has been helping her make better choices. She states she eats 1-2 times a day and started eating breakfast. When she eats out, she would skip meals until consuming the single high caloric meal. We reviewed her meal plan and all questions were answered.   Review of Systems:  Pertinent positives were addressed with patient today.   Weight Summary and Biometrics   Weight Lost Since Last Visit: 0  Weight Gained Since Last Visit: 1lb    Vitals Temp: 97.7 F (36.5 C) BP: (!) 140/82 Pulse Rate: 75 SpO2: 100 %   Anthropometric Measurements Height: 5\' 4"  (1.626 m) Weight: 274 lb (124.3 kg) BMI (Calculated): 47.01 Weight at Last Visit: 273lb Weight Lost Since Last Visit: 0 Weight Gained Since Last Visit: 1lb Starting Weight: 262lb Total Weight Loss (lbs): 0 lb (0 kg) Peak Weight: 318lb   Body Composition  Body Fat %: 54.7 % Fat Mass (lbs): 149.8 lbs Muscle Mass (lbs): 118 lbs Visceral Fat Rating : 18   Other Clinical Data Fasting: no Labs: no Today's Visit #: 34 Starting Date: 03/26/20     Objective:   PHYSICAL EXAM: Blood pressure (!) 140/82, pulse 75, temperature 97.7  F (36.5 C), height 5\' 4"  (1.626 m), weight 274 lb (124.3 kg), SpO2 100 %. Body mass index is 47.03 kg/m.  General: Well Developed, well nourished, and in no acute distress.  HEENT: Normocephalic, atraumatic Skin: Warm and dry, cap RF less 2 sec, good turgor Chest:  Normal excursion, shape, no gross abn Respiratory: speaking in full sentences, no conversational dyspnea NeuroM-Sk: Ambulates w/o assistance, moves * 4 Psych: A and O *3, insight good, mood-full  DIAGNOSTIC DATA REVIEWED:  BMET    Component Value Date/Time   NA 142 12/14/2021 0828   NA 138 10/14/2011 0752   K 4.4 12/14/2021 0828   K 4.1 10/14/2011 0752   CL 106 12/14/2021 0828   CL 106 10/14/2011 0752   CO2 22 12/14/2021 0828   CO2 27 10/14/2011 0752   GLUCOSE 85 12/14/2021 0828   GLUCOSE 87 04/16/2016 1123   GLUCOSE 90 10/14/2011 0752   BUN 16 12/14/2021 0828   BUN 11 10/14/2011 0752   CREATININE 0.73 12/14/2021 0828   CREATININE 0.55 (L) 10/14/2011 0752   CALCIUM 9.0 12/14/2021 0828  CALCIUM 9.0 10/14/2011 0752   GFRNONAA >90 01/29/2014 1950   GFRNONAA >60 10/14/2011 0752   GFRAA >90 01/29/2014 1950   GFRAA >60 10/14/2011 0752   Lab Results  Component Value Date   HGBA1C 5.5 12/14/2021   HGBA1C 5.4 03/23/2021   Lab Results  Component Value Date   INSULIN 18.7 12/14/2021   INSULIN 22.2 03/26/2020   Lab Results  Component Value Date   TSH 2.370 12/14/2021   CBC    Component Value Date/Time   WBC 5.1 12/14/2021 0828   WBC 8.0 12/15/2015 0905   RBC 3.86 12/14/2021 0828   RBC 3.86 (L) 12/15/2015 0905   HGB 10.9 (L) 12/14/2021 0828   HCT 34.6 12/14/2021 0828   PLT 322 12/14/2021 0828   MCV 90 12/14/2021 0828   MCV 91 10/14/2011 0752   MCH 28.2 12/14/2021 0828   MCH 29.8 12/15/2015 0905   MCHC 31.5 12/14/2021 0828   MCHC 33.0 12/15/2015 0905   RDW 13.6 12/14/2021 0828   RDW 14.1 10/14/2011 0752   Iron Studies No results found for: "IRON", "TIBC", "FERRITIN", "IRONPCTSAT" Lipid Panel      Component Value Date/Time   CHOL 203 (H) 12/14/2021 0828   TRIG 58 12/14/2021 0828   HDL 57 12/14/2021 0828   LDLCALC 135 (H) 12/14/2021 0828   Hepatic Function Panel     Component Value Date/Time   PROT 6.4 12/14/2021 0828   PROT 7.4 10/14/2011 0752   ALBUMIN 4.0 12/14/2021 0828   ALBUMIN 3.8 10/14/2011 0752   AST 12 12/14/2021 0828   AST 8 (L) 10/14/2011 0752   ALT 9 12/14/2021 0828   ALT 13 10/14/2011 0752   ALKPHOS 48 12/14/2021 0828   ALKPHOS 48 (L) 10/14/2011 0752   BILITOT <0.2 12/14/2021 0828   BILITOT 0.4 10/14/2011 0752      Component Value Date/Time   TSH 2.370 12/14/2021 0828   Nutritional Lab Results  Component Value Date   VD25OH 25.6 (L) 12/14/2021   VD25OH 29.7 (L) 03/23/2021    Attestations:   Reviewed by clinician on day of visit: allergies, medications, problem list, medical history, surgical history, family history, social history, and previous encounter notes.   Patient was in the office today and time spent on visit including pre-visit chart review and post-visit care/coordination of care and electronic medical record documentation was 30 minutes. 50% of that time was in face to face counseling of this patient's medical condition(s) and providing education on treatment options to always include the first-line treatment of diet and lifestyle modification.    I,Special Puri,acting as a Neurosurgeon for [employer], DO.,have documented all relevant documentation on the behalf of Thomasene Lot, DO,as directed by  Thomasene Lot, DO while in the presence of Thomasene Lot, DO.   I, Thomasene Lot, DO, have reviewed all documentation for this visit. The documentation on 05/27/22 for the exam, diagnosis, procedures, and orders are all accurate and complete.

## 2022-05-27 NOTE — Telephone Encounter (Signed)
IPJASN:[id number];ALPFXT:[id number]

## 2022-06-07 ENCOUNTER — Telehealth (INDEPENDENT_AMBULATORY_CARE_PROVIDER_SITE_OTHER): Payer: 59 | Admitting: Psychology

## 2022-06-07 DIAGNOSIS — F5089 Other specified eating disorder: Secondary | ICD-10-CM

## 2022-06-07 NOTE — Progress Notes (Signed)
  Office: [phone]  /  Fax: [phone]    Date: June 07, 2022    Appointment Start Time: 11:02am Duration: 26 minutes Provider: Lawerance Cruel, Psy.D. Type of Session: Individual Therapy  Location of Patient: Home (private location) Location of Provider: Provider's Home (private office) Type of Contact: Telepsychological Visit via MyChart Video Visit  Session Content: [name] is a 42 y.o. female presenting for a follow-up appointment to address the previously established treatment goal of increasing coping skills.Today's appointment was a telepsychological visit. [name] provided verbal consent for today's telepsychological appointment and she is aware she is responsible for securing confidentiality on her end of the session. Prior to proceeding with today's appointment, Lennyn's physical location at the time of this appointment was obtained as well a phone number she could be reached at in the event of technical difficulties. [name] and this provider participated in today's telepsychological service.   This provider conducted a brief check-in. Keelin shared an improvement in her eating habits after her last appointment with Dr. Sharee Holster. Associated thoughts and feelings were processed. Psychoeducation provided regarding radical acceptance. [name] and this provider practice radical acceptance by utilizing recent events and hypothetical scenarios. [name] was receptive to today's appointment as evidenced by openness to sharing, responsiveness to feedback, and willingness to implement discussed strategies .  Mental Status Examination:  Appearance: neat Behavior: appropriate to circumstances Mood: neutral Affect: mood congruent Speech: WNL Eye Contact: appropriate Psychomotor Activity: WNL Gait: unable to assess Thought Process: linear, logical, and goal directed and no evidence or endorsement of suicidal, homicidal, and self-harm ideation, plan and intent  Thought  Content/Perception: no hallucinations, delusions, bizarre thinking or behavior endorsed or observed Orientation: AAOx4 Memory/Concentration: memory, attention, language, and fund of knowledge intact  Insight: fair Judgment: fair  Interventions:  Conducted a brief chart review Provided empathic reflections and validation Provided positive reinforcement Employed supportive psychotherapy interventions to facilitate reduced distress and to improve coping skills with identified stressors Psychoeducation provided regarding radical acceptance   DSM-5 Diagnosis(es): F50.89 Other Specified Feeding or Eating Disorder, Emotional and Binge Eating Behaviors  Treatment Goal & Progress: During the initial appointment with this provider, the following treatment goal was established: increase coping skills. [name] has demonstrated progress in her goal as evidenced by increased awareness of hunger patterns and increased awareness of triggers for emotional eating behaviors. [name] also continues to demonstrate willingness to engage in learned skill(s).  Plan: The next appointment is scheduled for 06/22/2022 at 11am, which will be via MyChart Video Visit. The next session will focus on working towards the established treatment goal.

## 2022-06-10 ENCOUNTER — Ambulatory Visit (INDEPENDENT_AMBULATORY_CARE_PROVIDER_SITE_OTHER): Payer: 59 | Admitting: Family Medicine

## 2022-06-10 ENCOUNTER — Encounter (INDEPENDENT_AMBULATORY_CARE_PROVIDER_SITE_OTHER): Payer: Self-pay | Admitting: Family Medicine

## 2022-06-10 VITALS — BP 112/76 | HR 70 | Temp 97.6°F | Ht 64.0 in | Wt 272.0 lb

## 2022-06-10 DIAGNOSIS — Z6841 Body Mass Index (BMI) 40.0 and over, adult: Secondary | ICD-10-CM

## 2022-06-10 DIAGNOSIS — E88819 Insulin resistance, unspecified: Secondary | ICD-10-CM | POA: Diagnosis not present

## 2022-06-10 DIAGNOSIS — E559 Vitamin D deficiency, unspecified: Secondary | ICD-10-CM | POA: Diagnosis not present

## 2022-06-10 MED ORDER — METFORMIN HCL 500 MG PO TABS: ORAL_TABLET | ORAL | 0 refills | Status: DC

## 2022-06-10 NOTE — Progress Notes (Signed)
[name], D.O.  ABFM, ABOM Specializing in Clinical Bariatric Medicine  Office located at: [address] [place], [place]  [address]     Assessment and Plan:   No orders of the defined types were placed in this encounter.   Medications Discontinued During This Encounter  Medication Reason   tirzepatide [facility]) 2.5 MG/0.5ML Pen Not covered by the pt's insurance   Semaglutide,0.25 or 0.5MG /DOS, (OZEMPIC, 0.25 OR 0.5 MG/DOSE,) 2 MG/1.5ML SOPN Not covered by the pt's insurance   metFORMIN (GLUCOPHAGE) 500 MG tablet Discontinued by provider     Meds ordered this encounter  Medications   metFORMIN (GLUCOPHAGE) 500 MG tablet    Sig: 1/2 po with lunch and dinner daily    Dispense:  30 tablet    Refill:  0    30 d supply;  ** OV for RF **   Do not send RF request     Insulin resistance Assessment: Condition is not optimized.  Lab Results  Component Value Date   HGBA1C 5.5 12/14/2021   HGBA1C 5.4 03/23/2021   INSULIN 18.7 12/14/2021   INSULIN 16.6 03/23/2021   INSULIN 22.2 03/26/2020  - Patient is currently not on any medication for the Insulin Resistance condition.  - Her hunger and cravings are mostly controlled, which is likely due to her increasing her protein intake.  - Sometimes she "randomly feels hungry" in the evenings and when this happens she eats a protein rich food.  - Tried Metformin in the past and felt nauseated and discontinued. She would take it during breakfast and lunch.   Plan -  Begin Metformin 500 mg: Patient instructed to take 1/2 tab with dinner for the first 4 days and then increase to 1/2 tab at lunch, if tolerating well. Discussed risk and benefits of starting Metformin - Continue to decrease simple carbs/ sugars; increase fiber and proteins -> follow her meal plan.   Payslee Bateson will continue to work on weight loss, exercise, via their meal plan we devised to help decrease the risk of progressing to diabetes.    Vitamin D  deficiency Assessment: Condition is Not optimized.  Lab Results  Component Value Date   VD25OH 25.6 (L) 12/14/2021   VD25OH 29.7 (L) 03/23/2021  - No issues with Ergocalciferol 50K IU weekly. Denies any side effects, compliance good.   Plan: - Continue with med. Patient denies need for refill.  - weight loss will likely improve availability of vitamin D, thus encouraged Makhiya to continue with meal plan and their weight loss efforts to further improve this condition.     TREATMENT PLAN FOR OBESITY: BMI 45.0-49.9, adult (HCC)-CURRENT BMI 46.67 Morbid obesity (HCC)-START BMI 51.49/DATE 03/26/20 Assessment: Condition is improving, but not optimized. Biometric data collected today, was reviewed with patient.  Fat mass has decreased by 3.2 lb. Muscle mass has increased by 1 lb.   Plan:  Trust is currently in the action stage of change. As such, her goal is to continue weight management plan. Caeleigh will work on healthier eating habits and continue the the Category 3 Plan and keeping a food journal and adhering to recommended goals of 1400-1500 calories and 100 protein. - I went over the protein content of food handout with patient and showed her the differences in calories and protein for various types of meat. - I recommended her to use non-fat greek yogurt as a salad dressing.    Behavioral Intervention Additional resources provided today:  protein content of food  handout Evidence-based interventions for health behavior change were utilized today including the discussion of self monitoring techniques, problem-solving barriers and SMART goal setting techniques.   Regarding patient's less desirable eating habits and patterns, we employed the technique of small changes.  Pt will specifically work on: continue with her meal plan and exercise regiment for next visit.    Recommended Physical Activity Goals Janalyn has been advised to work up to 150 minutes of moderate intensity aerobic  activity a week and strengthening exercises 2-3 times per week for cardiovascular health, weight loss maintenance and preservation of muscle mass.  She has agreed to Continue current level of physical activity   FOLLOW UP: Return in about 2 weeks (around 06/24/2022). She was informed of the importance of frequent follow up visits to maximize her success with intensive lifestyle modifications for her multiple health conditions.  Subjective:   Chief complaint: Obesity Sheccid is here to discuss her progress with her obesity treatment plan. She is on the Category 3 Plan and keeping a food journal and adhering to recommended goals of 1400-1500 calories and 100 protein and states she is following her eating plan approximately 99% of the time. She states she is walking 10 minutes 8 times per week.  Interval History:  [name] is here for a follow up office visit.  Since last office visit: - She journaled her intake and has been hitting her caloric and protein goals almost every day.  - She endorses that her joints feel better, which she associates with her increased water intake and eating healthier. - She has been increasing her protein intake and noticed that she is feeling less hungry.   We reviewed her meal plan and all questions were answered. Patient's food recall appears to be accurate and consistent with what is on plan when she is following it. When eating on plan, her hunger and cravings are well controlled.     Review of Systems:  Pertinent positives were addressed with patient today.   Weight Summary and Biometrics   Weight Lost Since Last Visit: 2 lb  Weight Gained Since Last Visit: 0    Vitals Temp: 97.6 F (36.4 C) BP: 112/76 Pulse Rate: 70 SpO2: 98 %   Anthropometric Measurements Height: 5\' 4"  (1.626 m) Weight: 272 lb (123.4 kg) BMI (Calculated): 46.67 Weight at Last Visit: 274 lb Weight Lost Since Last Visit: 2 lb Weight Gained Since Last Visit:  0 Starting Weight: 262 lb Total Weight Loss (lbs): 0 lb (0 kg) Peak Weight: 318 lb   Body Composition  Body Fat %: 53.9 % Fat Mass (lbs): 146.6 lbs Muscle Mass (lbs): 119 lbs Visceral Fat Rating : 18   Other Clinical Data Fasting: No Labs: No Today's Visit #: 35 Starting Date: 03/26/20   Objective:   PHYSICAL EXAM: Blood pressure 112/76, pulse 70, temperature 97.6 F (36.4 C), height 5\' 4"  (1.626 m), weight 272 lb (123.4 kg), SpO2 98 %. Body mass index is 46.69 kg/m.  General: Well Developed, well nourished, and in no acute distress.  HEENT: Normocephalic, atraumatic Skin: Warm and dry, cap RF less 2 sec, good turgor Chest:  Normal excursion, shape, no gross abn Respiratory: speaking in full sentences, no conversational dyspnea NeuroM-Sk: Ambulates w/o assistance, moves * 4 Psych: A and O *3, insight good, mood-full  DIAGNOSTIC DATA REVIEWED:  BMET    Component Value Date/Time   NA 142 12/14/2021 0828   NA 138 10/14/2011 0752   K 4.4 12/14/2021 0828  K 4.1 10/14/2011 0752   CL 106 12/14/2021 0828   CL 106 10/14/2011 0752   CO2 22 12/14/2021 0828   CO2 27 10/14/2011 0752   GLUCOSE 85 12/14/2021 0828   GLUCOSE 87 04/16/2016 1123   GLUCOSE 90 10/14/2011 0752   BUN 16 12/14/2021 0828   BUN 11 10/14/2011 0752   CREATININE 0.73 12/14/2021 0828   CREATININE 0.55 (L) 10/14/2011 0752   CALCIUM 9.0 12/14/2021 0828   CALCIUM 9.0 10/14/2011 0752   GFRNONAA >90 01/29/2014 1950   GFRNONAA >60 10/14/2011 0752   GFRAA >90 01/29/2014 1950   GFRAA >60 10/14/2011 0752   Lab Results  Component Value Date   HGBA1C 5.5 12/14/2021   HGBA1C 5.4 03/23/2021   Lab Results  Component Value Date   INSULIN 18.7 12/14/2021   INSULIN 22.2 03/26/2020   Lab Results  Component Value Date   TSH 2.370 12/14/2021   CBC    Component Value Date/Time   WBC 5.1 12/14/2021 0828   WBC 8.0 12/15/2015 0905   RBC 3.86 12/14/2021 0828   RBC 3.86 (L) 12/15/2015 0905   HGB 10.9 (L)  12/14/2021 0828   HCT 34.6 12/14/2021 0828   PLT 322 12/14/2021 0828   MCV 90 12/14/2021 0828   MCV 91 10/14/2011 0752   MCH 28.2 12/14/2021 0828   MCH 29.8 12/15/2015 0905   MCHC 31.5 12/14/2021 0828   MCHC 33.0 12/15/2015 0905   RDW 13.6 12/14/2021 0828   RDW 14.1 10/14/2011 0752   Iron Studies No results found for: "IRON", "TIBC", "FERRITIN", "IRONPCTSAT" Lipid Panel     Component Value Date/Time   CHOL 203 (H) 12/14/2021 0828   TRIG 58 12/14/2021 0828   HDL 57 12/14/2021 0828   LDLCALC 135 (H) 12/14/2021 0828   Hepatic Function Panel     Component Value Date/Time   PROT 6.4 12/14/2021 0828   PROT 7.4 10/14/2011 0752   ALBUMIN 4.0 12/14/2021 0828   ALBUMIN 3.8 10/14/2011 0752   AST 12 12/14/2021 0828   AST 8 (L) 10/14/2011 0752   ALT 9 12/14/2021 0828   ALT 13 10/14/2011 0752   ALKPHOS 48 12/14/2021 0828   ALKPHOS 48 (L) 10/14/2011 0752   BILITOT <0.2 12/14/2021 0828   BILITOT 0.4 10/14/2011 0752      Component Value Date/Time   TSH 2.370 12/14/2021 0828   Nutritional Lab Results  Component Value Date   VD25OH 25.6 (L) 12/14/2021   VD25OH 29.7 (L) 03/23/2021    Attestations:   Reviewed by clinician on day of visit: allergies, medications, problem list, medical history, surgical history, family history, social history, and previous encounter notes.   I,Special Puri,acting as a Neurosurgeon for [employer], DO.,have documented all relevant documentation on the behalf of Thomasene Lot, DO,as directed by  Thomasene Lot, DO while in the presence of Thomasene Lot, DO.   I, Thomasene Lot, DO, have reviewed all documentation for this visit. The documentation on 06/10/22 for the exam, diagnosis, procedures, and orders are all accurate and complete.

## 2022-06-22 ENCOUNTER — Encounter (INDEPENDENT_AMBULATORY_CARE_PROVIDER_SITE_OTHER): Payer: Self-pay

## 2022-06-22 ENCOUNTER — Telehealth (INDEPENDENT_AMBULATORY_CARE_PROVIDER_SITE_OTHER): Payer: Self-pay | Admitting: Psychology

## 2022-06-22 ENCOUNTER — Encounter (INDEPENDENT_AMBULATORY_CARE_PROVIDER_SITE_OTHER): Payer: 59 | Admitting: Psychology

## 2022-06-22 NOTE — Progress Notes (Signed)
Entered in error

## 2022-06-22 NOTE — Telephone Encounter (Signed)
  Office: [phone]  /  Fax: [phone]  Date of Encounter: Jun 22, 2022  Time of Encounter: 11:02am Duration of Encounter: ~2 minute(s) Provider: Lawerance Cruel, PsyD  CONTENT:  [name] presented for today's appointment via MyChart Video Visit. She explained feeling "anxious" about the State coming to her work, noting she did not want to cause this provider any issues by canceling today. This provider explained the appointment could be rescheduled. [name] was receptive and grateful. She acknowledged understanding the one time no show fee waiver will be applied. A brief check-in was completed. She stated things are going "well." No evidence or endorsement of safety concerns. All questions/concerns addressed.   PLAN: [name] is scheduled for an appointment on 07/13/2022 at 2pm via MyChart Video Visit.

## 2022-06-24 ENCOUNTER — Ambulatory Visit (INDEPENDENT_AMBULATORY_CARE_PROVIDER_SITE_OTHER): Payer: 59 | Admitting: Family Medicine

## 2022-06-24 ENCOUNTER — Encounter (INDEPENDENT_AMBULATORY_CARE_PROVIDER_SITE_OTHER): Payer: Self-pay | Admitting: Family Medicine

## 2022-06-24 VITALS — BP 111/69 | HR 81 | Temp 98.1°F | Ht 64.0 in | Wt 269.0 lb

## 2022-06-24 DIAGNOSIS — Z6841 Body Mass Index (BMI) 40.0 and over, adult: Secondary | ICD-10-CM

## 2022-06-24 DIAGNOSIS — E88819 Insulin resistance, unspecified: Secondary | ICD-10-CM | POA: Diagnosis not present

## 2022-06-24 DIAGNOSIS — E559 Vitamin D deficiency, unspecified: Secondary | ICD-10-CM | POA: Diagnosis not present

## 2022-06-24 DIAGNOSIS — F39 Unspecified mood [affective] disorder: Secondary | ICD-10-CM | POA: Diagnosis not present

## 2022-06-24 MED ORDER — VITAMIN D (ERGOCALCIFEROL) 1.25 MG (50000 UNIT) PO CAPS: 50000.0000 [IU] | ORAL_CAPSULE | ORAL | 0 refills | Status: DC

## 2022-06-24 NOTE — Progress Notes (Signed)
[name], D.O.  ABFM, ABOM Specializing in Clinical Bariatric Medicine  Office located at: [address] [place], [place]  [address]     Assessment and Plan:    Medications Discontinued During This Encounter  Medication Reason   Vitamin D, Ergocalciferol, (DRISDOL) 1.25 MG (50000 UNIT) CAPS capsule Reorder     Meds ordered this encounter  Medications   Vitamin D, Ergocalciferol, (DRISDOL) 1.25 MG (50000 UNIT) CAPS capsule    Sig: Take 1 capsule (50,000 Units total) by mouth every 7 (seven) days.    Dispense:  4 capsule    Refill:  0     Check Fasting Insulin, BMP with GFR, A1c, Vitamin D, B12,  Fasting Lipid Profile, and CBC next OV or prior to next OV.     Vitamin D deficiency Assessment: Condition is not optimized. Lab Results  Component Value Date   VD25OH 25.6 (L) 12/14/2021   VD25OH 29.7 (L) 03/23/2021  - Reports good compliance and tolerance with Ergocalciferol 50K IU weekly. Denies any side effects.   Plan: - Continue with med. Will refill Ergocalciferol 50K IU today.   - Continue her prudent nutritional plan that is low in simple carbohydrates, saturated fats and trans fats to goal of 5-10% weight loss to achieve significant health benefits.     Insulin resistance Assessment: Condition is not optimized.  Lab Results  Component Value Date   HGBA1C 5.5 12/14/2021   HGBA1C 5.4 03/23/2021   INSULIN 18.7 12/14/2021   INSULIN 16.6 03/23/2021   INSULIN 22.2 03/26/2020  - She is taking 1/2 tab Metformin at lunch and 1/2 tab at dinner. Denies any loose stools/side effects. - She endorses that her hunger and cravings at night has decreased.  - When she does have cravings/ wants to snack she reports eating a wrap with either roast beef or [place].   Plan: - Continue with med.   - Continue her prudent nutritional plan that is low in simple carbohydrates, saturated fats and trans fats to goal of 5-10% weight loss to achieve significant health  benefits.      Mood disorder (HCC), with emotional eating Assessment: Condition is improving, but not optimized.  - Denies any SI/HI. Endorses that mood is better now that she is making more healthier decisions.  - Reports that job has been very stressful.  - She has been speaking with Dr.Barker about strategies to manage stress related eating.  - Cravings and hunger are better controlled.  - No issues with Wellbutrin SR 150 mg daily. Denies any side effects.  Plan:  - Continue with med. Denies need for refill.  - Continue meeting with Dr.Barker - Continue adherence to prudent nutritional plan. - Will continue to monitor symptoms as it relates to her weight loss journey.   TREATMENT PLAN FOR OBESITY: BMI 45.0-49.9, adult (HCC)-CURRENT BMI 46.15 Morbid obesity (HCC)-START BMI 51.49/DATE 03/26/20 Assessment:  [name] is here to discuss her progress with her obesity treatment plan along with follow-up of her obesity related diagnoses. See Medical Weight Management Flowsheet for complete bioelectrical impedance results.  Condition is improving, but not optimized. Biometric data collected today, was reviewed with patient.   Since last office visit on 06/10/22 patient's Fat mass has decreased by 5.2 lb. Muscle mass has increased by 3 lb.Counseling done on how various foods will affect these numbers and how to maximize success.  Today's visit was #: 36 Starting weight: 300 lb  Starting date: 03/26/20 Weight change since last  visit: - 3 lbs  Total lbs lost to date: 31 Total weight loss percentage to date: 10.33% ( use https://[email or web address].si )  Plan:  - Eryka will work on healthier eating habits and continue the Category 3 Plan and keeping a food journal and adhering to recommended goals of 1400-1500 calories and 100+ protein - I again emphasized the importance of having protein with every meal.   Behavioral Intervention Additional resources  provided today: recipe packet #1, recipe packet #2, and food log handout Evidence-based interventions for health behavior change were utilized today including the discussion of self monitoring techniques, problem-solving barriers and SMART goal setting techniques.   Regarding patient's less desirable eating habits and patterns, we employed the technique of small changes.  Pt will specifically work on: continue adherence to prudent nutritional plan for next visit.    Recommended Physical Activity Goals  Elesha has been advised to slowly work up to 150 minutes of moderate intensity aerobic activity a week and strengthening exercises 2-3 times per week for cardiovascular health, weight loss maintenance and preservation of muscle mass.   She has agreed to Continue current level of physical activity   FOLLOW UP: No follow-ups on file. She was informed of the importance of frequent follow up visits to maximize her success with intensive lifestyle modifications for her multiple health conditions.  Subjective:   Chief complaint: Obesity [name] is here to discuss her progress with her obesity treatment plan. She is on the Category 3 Plan and keeping a food journal and adhering to recommended goals of 1400-1500 calories and 100+ protein and states she is following her eating plan approximately 90% of the time. She states she is not exercising  Interval History:  [name] is here for a follow up office visit.   This is patient's 36 office visit with [name].    Since last office visit:   - She struggled getting her protein in when she was out of town, but feels that she is back on track now.  - From Journaling, she learned that she is "not good at meal preparing when she is outside her normal routine"  Pharmacotherapy for weight loss: She is currently taking  Metformin  for medical weight loss.  Denies side effects.    Review of Systems:  Pertinent positives were addressed with patient  today.  Weight Summary and Biometrics   Weight Lost Since Last Visit: 3 lb  Weight Gained Since Last Visit: 0    Vitals Temp: 98.1 F (36.7 C) BP: 111/69 Pulse Rate: 81 SpO2: 97 %   Anthropometric Measurements Height: 5\' 4"  (1.626 m) Weight: 269 lb (122 kg) BMI (Calculated): 46.15 Weight at Last Visit: 272 lb Weight Lost Since Last Visit: 3 lb Weight Gained Since Last Visit: 0 Starting Weight: 262 lb Total Weight Loss (lbs): 3 lb (1.361 kg) Peak Weight: 318 lb   Body Composition  Body Fat %: 52.4 % Fat Mass (lbs): 141.4 lbs Muscle Mass (lbs): 122 lbs Total Body Water (lbs): 96.6 lbs Visceral Fat Rating : 17   Other Clinical Data Fasting: Yes Labs: Yes Today's Visit #: 36    Objective:   PHYSICAL EXAM: Blood pressure 111/69, pulse 81, temperature 98.1 F (36.7 C), height 5\' 4"  (1.626 m), weight 269 lb (122 kg), SpO2 97 %. Body mass index is 46.17 kg/m.  General: Well Developed, well nourished, and in no acute distress.  HEENT: Normocephalic, atraumatic Skin: Warm and dry, cap RF less 2 sec, good turgor Chest:  Normal excursion, shape, no gross abn Respiratory: speaking in full sentences, no conversational dyspnea NeuroM-Sk: Ambulates w/o assistance, moves * 4 Psych: A and O *3, insight good, mood-full  DIAGNOSTIC DATA REVIEWED:  BMET    Component Value Date/Time   NA 142 12/14/2021 0828   NA 138 10/14/2011 0752   K 4.4 12/14/2021 0828   K 4.1 10/14/2011 0752   CL 106 12/14/2021 0828   CL 106 10/14/2011 0752   CO2 22 12/14/2021 0828   CO2 27 10/14/2011 0752   GLUCOSE 85 12/14/2021 0828   GLUCOSE 87 04/16/2016 1123   GLUCOSE 90 10/14/2011 0752   BUN 16 12/14/2021 0828   BUN 11 10/14/2011 0752   CREATININE 0.73 12/14/2021 0828   CREATININE 0.55 (L) 10/14/2011 0752   CALCIUM 9.0 12/14/2021 0828   CALCIUM 9.0 10/14/2011 0752   GFRNONAA >90 01/29/2014 1950   GFRNONAA >60 10/14/2011 0752   GFRAA >90 01/29/2014 1950   GFRAA >60 10/14/2011  0752   Lab Results  Component Value Date   HGBA1C 5.5 12/14/2021   HGBA1C 5.4 03/23/2021   Lab Results  Component Value Date   INSULIN 18.7 12/14/2021   INSULIN 22.2 03/26/2020   Lab Results  Component Value Date   TSH 2.370 12/14/2021   CBC    Component Value Date/Time   WBC 5.1 12/14/2021 0828   WBC 8.0 12/15/2015 0905   RBC 3.86 12/14/2021 0828   RBC 3.86 (L) 12/15/2015 0905   HGB 10.9 (L) 12/14/2021 0828   HCT 34.6 12/14/2021 0828   PLT 322 12/14/2021 0828   MCV 90 12/14/2021 0828   MCV 91 10/14/2011 0752   MCH 28.2 12/14/2021 0828   MCH 29.8 12/15/2015 0905   MCHC 31.5 12/14/2021 0828   MCHC 33.0 12/15/2015 0905   RDW 13.6 12/14/2021 0828   RDW 14.1 10/14/2011 0752   Iron Studies No results found for: "IRON", "TIBC", "FERRITIN", "IRONPCTSAT" Lipid Panel     Component Value Date/Time   CHOL 203 (H) 12/14/2021 0828   TRIG 58 12/14/2021 0828   HDL 57 12/14/2021 0828   LDLCALC 135 (H) 12/14/2021 0828   Hepatic Function Panel     Component Value Date/Time   PROT 6.4 12/14/2021 0828   PROT 7.4 10/14/2011 0752   ALBUMIN 4.0 12/14/2021 0828   ALBUMIN 3.8 10/14/2011 0752   AST 12 12/14/2021 0828   AST 8 (L) 10/14/2011 0752   ALT 9 12/14/2021 0828   ALT 13 10/14/2011 0752   ALKPHOS 48 12/14/2021 0828   ALKPHOS 48 (L) 10/14/2011 0752   BILITOT <0.2 12/14/2021 0828   BILITOT 0.4 10/14/2011 0752      Component Value Date/Time   TSH 2.370 12/14/2021 0828   Nutritional Lab Results  Component Value Date   VD25OH 25.6 (L) 12/14/2021   VD25OH 29.7 (L) 03/23/2021    Attestations:   Reviewed by clinician on day of visit: allergies, medications, problem list, medical history, surgical history, family history, social history, and previous encounter notes.   I,Special Puri,acting as a Neurosurgeon for [employer], DO.,have documented all relevant documentation on the behalf of Thomasene Lot, DO,as directed by  Thomasene Lot, DO while in the presence of  Thomasene Lot, DO.   I, Thomasene Lot, DO, have reviewed all documentation for this visit. The documentation on 06/24/22 for the exam, diagnosis, procedures, and orders are all accurate and complete.

## 2022-07-08 ENCOUNTER — Encounter: Payer: Self-pay | Admitting: Obstetrics and Gynecology

## 2022-07-08 ENCOUNTER — Ambulatory Visit: Payer: 59 | Admitting: Obstetrics and Gynecology

## 2022-07-08 ENCOUNTER — Encounter (INDEPENDENT_AMBULATORY_CARE_PROVIDER_SITE_OTHER): Payer: Self-pay | Admitting: Family Medicine

## 2022-07-08 ENCOUNTER — Ambulatory Visit (INDEPENDENT_AMBULATORY_CARE_PROVIDER_SITE_OTHER): Payer: 59 | Admitting: Family Medicine

## 2022-07-08 VITALS — BP 115/77 | HR 70 | Temp 97.5°F | Ht 64.0 in | Wt 272.0 lb

## 2022-07-08 VITALS — BP 124/83 | HR 77 | Ht 65.0 in | Wt 277.4 lb

## 2022-07-08 DIAGNOSIS — E7849 Other hyperlipidemia: Secondary | ICD-10-CM

## 2022-07-08 DIAGNOSIS — N938 Other specified abnormal uterine and vaginal bleeding: Secondary | ICD-10-CM

## 2022-07-08 DIAGNOSIS — E538 Deficiency of other specified B group vitamins: Secondary | ICD-10-CM

## 2022-07-08 DIAGNOSIS — E88819 Insulin resistance, unspecified: Secondary | ICD-10-CM | POA: Diagnosis not present

## 2022-07-08 DIAGNOSIS — E559 Vitamin D deficiency, unspecified: Secondary | ICD-10-CM

## 2022-07-08 DIAGNOSIS — Z6841 Body Mass Index (BMI) 40.0 and over, adult: Secondary | ICD-10-CM

## 2022-07-08 MED ORDER — METFORMIN HCL 500 MG PO TABS
ORAL_TABLET | ORAL | 0 refills | Status: DC
Start: 2022-07-08 — End: 2022-07-29

## 2022-07-08 MED ORDER — TRANEXAMIC ACID 650 MG PO TABS
1300.0000 mg | ORAL_TABLET | Freq: Three times a day (TID) | ORAL | 2 refills | Status: DC
Start: 2022-07-08 — End: 2022-12-23

## 2022-07-08 NOTE — Progress Notes (Signed)
[name], D.O.  ABFM, ABOM Specializing in Clinical Bariatric Medicine  Office located at: [address] [place], [place]  [address]     Assessment and Plan:   Medications Discontinued During This Encounter  Medication Reason   metFORMIN (GLUCOPHAGE) 500 MG tablet Reorder     Meds ordered this encounter  Medications   metFORMIN (GLUCOPHAGE) 500 MG tablet    Sig: 1 po with lunch and dinner daily    Dispense:  60 tablet    Refill:  0    30 d supply;  ** OV for RF **   Do not send RF request    Check Fasting Insulin, BMP with GFR, A1c, Vitamin D, B12, Fasting Lipid Profile, and CBC next OV or prior to next OV.    Other hyperlipidemia Assessment: Condition is not optimized. Lab Results  Component Value Date   CHOL 203 (H) 12/14/2021   HDL 57 12/14/2021   LDLCALC 135 (H) 12/14/2021   TRIG 58 12/14/2021  - Patient is currently not taking any medication for this condition. Current treatment for this condition is weight loss and exercise.   Plan:  - TIFFANYMARIE FREDRICK agrees to continue with our treatment plan of a heart-heathy, low cholesterol meal plan     Insulin resistance Assessment: Condition is not optimized. Lab Results  Component Value Date   HGBA1C 5.5 12/14/2021   HGBA1C 5.4 03/23/2021   INSULIN 18.7 12/14/2021   INSULIN 16.6 03/23/2021   INSULIN 22.2 03/26/2020  - She has been compliant and tolerant with 1/2 tab Metformin and 1/2 tab at dinner. Denies any adverse effects. - Hunger and cravings are mostly controlled when eating on plan.   Plan: - Increase Metformin to 1 tab BID. Start with 1 tab once daily for 3-4 days, and if tolerating well increase to 1 tab BID. Risks/benefits of increased Metformin dose were discussed with patient. All questions were answered appropriately.  Leiah Hourani will continue to work on weight loss, exercise, via their meal plan we devised to help decrease the risk of progressing to diabetes.    Vitamin D  deficiency Assessment: Condition is not optimized. Lab Results  Component Value Date   VD25OH 25.6 (L) 12/14/2021   VD25OH 29.7 (L) 03/23/2021  - No issues with Ergocalciferol 50K IU weekly. Denies any side effects.   Plan: - Continue with OTC supplement - Weight loss will likely improve availability of vitamin D, thus encouraged Adya to continue with meal plan and their weight loss efforts to further improve this condition.     B12 deficiency Assessment: Condition is not optimized. Lab Results  Component Value Date   VITAMINB12 348 12/14/2021  - She has been compliant with OTC Vitamin B12 500 mcg once daily. Denies any side effects.  Plan: - Continue with supplement. - Continue her prudent nutritional plan that is low in simple carbohydrates, saturated fats and trans fats to goal of 5-10% weight loss to achieve significant health benefits.    TREATMENT PLAN FOR OBESITY: BMI 45.0-49.9, adult (HCC)-CURRENT BMI 46.15 Morbid obesity (HCC)-START BMI 51.49/DATE 03/26/20 Assessment:  [name] is here to discuss her progress with her obesity treatment plan along with follow-up of her obesity related diagnoses. See Medical Weight Management Flowsheet for complete bioelectrical impedance results.  Condition is not optimized. Biometric data collected today, was reviewed with patient.   Since last office visit on 06/24/22 patient's Fat mass has increased by 6.6 lb. Muscle mass has decreased by  3.8 lb. Counseling done on how various foods will affect these numbers and how to maximize success  Total lbs lost to date: 28 Total weight loss percentage to date: 9.33   Plan:  -  Continue the Category 3 Plan and keeping a food journal and adhering to recommended goals of 1400-1500 calories and 100+ protein.   - Encouraged pt to look at nutritional menu whenever eating out.   Behavioral Intervention Additional resources provided today:  food journaling log Evidence-based interventions  for health behavior change were utilized today including the discussion of self monitoring techniques, problem-solving barriers and SMART goal setting techniques.   Regarding patient's less desirable eating habits and patterns, we employed the technique of small changes.  Pt will specifically work on: walking 15 minutes, 3 days a week  and increasing water intake to 100+ ounces per day for next visit.    Recommended Physical Activity Goals  [name] has been advised to slowly work up to 150 minutes of moderate intensity aerobic activity a week and strengthening exercises 2-3 times per week for cardiovascular health, weight loss maintenance and preservation of muscle mass.   She has agreed to Think about ways to increase daily physical activity and overcoming barriers to exercise  FOLLOW UP: Return in about 3 weeks (around 07/29/2022). She was informed of the importance of frequent follow up visits to maximize her success with intensive lifestyle modifications for her multiple health conditions.   Subjective:   Chief complaint: Obesity [name] is here to discuss her progress with her obesity treatment plan. She is on the the Category 3 Plan and keeping a food journal and adhering to recommended goals of 1400-1500 calories and 100+ protein and states she is following her eating plan approximately 90% of the time. She states she is not exercising.  Interval History:  [name] is here for a follow up office visit.     Since last office visit:   - Endorses having a very bad cycle. - Her journaling log indicates that the majority of the time her protein is within the goal of 100+ daily.  - Yesterday, she had 4 slices of Pizza with a Sirloin steak.  - She is sleeping 7-9 hrs daily.  - Drinking about 60-100 ounces of water daily.   Pharmacotherapy for weight loss: She is currently taking  Metformin  for medical weight loss.  Denies side effects.    Review of Systems:  Pertinent  positives were addressed with patient today.  Weight Summary and Biometrics   Weight Lost Since Last Visit: 0  Weight Gained Since Last Visit: 3lb    Vitals Temp: (!) 97.5 F (36.4 C) BP: 115/77 Pulse Rate: 70 SpO2: 98 %   Anthropometric Measurements Height: 5\' 4"  (1.626 m) Weight: 272 lb (123.4 kg) BMI (Calculated): 46.67 Weight at Last Visit: 269lb Weight Lost Since Last Visit: 0 Weight Gained Since Last Visit: 3lb Starting Weight: 300lb Total Weight Loss (lbs): 0 lb (0 kg) Peak Weight: 318lb   Body Composition  Body Fat %: 54.3 % Fat Mass (lbs): 148 lbs Muscle Mass (lbs): 118.2 lbs Visceral Fat Rating : 18   Other Clinical Data Fasting: Yes Labs: no Today's Visit #: 37 Starting Date: 03/26/20   Objective:   PHYSICAL EXAM: Blood pressure 115/77, pulse 70, temperature (!) 97.5 F (36.4 C), height 5\' 4"  (1.626 m), weight 272 lb (123.4 kg), SpO2 98 %. Body mass index is 46.69 kg/m.  General: Well Developed, well nourished, and in  no acute distress.  HEENT: Normocephalic, atraumatic Skin: Warm and dry, cap RF less 2 sec, good turgor Chest:  Normal excursion, shape, no gross abn Respiratory: speaking in full sentences, no conversational dyspnea NeuroM-Sk: Ambulates w/o assistance, moves * 4 Psych: A and O *3, insight good, mood-full  DIAGNOSTIC DATA REVIEWED:  BMET    Component Value Date/Time   NA 142 12/14/2021 0828   NA 138 10/14/2011 0752   K 4.4 12/14/2021 0828   K 4.1 10/14/2011 0752   CL 106 12/14/2021 0828   CL 106 10/14/2011 0752   CO2 22 12/14/2021 0828   CO2 27 10/14/2011 0752   GLUCOSE 85 12/14/2021 0828   GLUCOSE 87 04/16/2016 1123   GLUCOSE 90 10/14/2011 0752   BUN 16 12/14/2021 0828   BUN 11 10/14/2011 0752   CREATININE 0.73 12/14/2021 0828   CREATININE 0.55 (L) 10/14/2011 0752   CALCIUM 9.0 12/14/2021 0828   CALCIUM 9.0 10/14/2011 0752   GFRNONAA >90 01/29/2014 1950   GFRNONAA >60 10/14/2011 0752   GFRAA >90 01/29/2014  1950   GFRAA >60 10/14/2011 0752   Lab Results  Component Value Date   HGBA1C 5.5 12/14/2021   HGBA1C 5.4 03/23/2021   Lab Results  Component Value Date   INSULIN 18.7 12/14/2021   INSULIN 22.2 03/26/2020   Lab Results  Component Value Date   TSH 2.370 12/14/2021   CBC    Component Value Date/Time   WBC 5.1 12/14/2021 0828   WBC 8.0 12/15/2015 0905   RBC 3.86 12/14/2021 0828   RBC 3.86 (L) 12/15/2015 0905   HGB 10.9 (L) 12/14/2021 0828   HCT 34.6 12/14/2021 0828   PLT 322 12/14/2021 0828   MCV 90 12/14/2021 0828   MCV 91 10/14/2011 0752   MCH 28.2 12/14/2021 0828   MCH 29.8 12/15/2015 0905   MCHC 31.5 12/14/2021 0828   MCHC 33.0 12/15/2015 0905   RDW 13.6 12/14/2021 0828   RDW 14.1 10/14/2011 0752   Iron Studies No results found for: "IRON", "TIBC", "FERRITIN", "IRONPCTSAT" Lipid Panel     Component Value Date/Time   CHOL 203 (H) 12/14/2021 0828   TRIG 58 12/14/2021 0828   HDL 57 12/14/2021 0828   LDLCALC 135 (H) 12/14/2021 0828   Hepatic Function Panel     Component Value Date/Time   PROT 6.4 12/14/2021 0828   PROT 7.4 10/14/2011 0752   ALBUMIN 4.0 12/14/2021 0828   ALBUMIN 3.8 10/14/2011 0752   AST 12 12/14/2021 0828   AST 8 (L) 10/14/2011 0752   ALT 9 12/14/2021 0828   ALT 13 10/14/2011 0752   ALKPHOS 48 12/14/2021 0828   ALKPHOS 48 (L) 10/14/2011 0752   BILITOT <0.2 12/14/2021 0828   BILITOT 0.4 10/14/2011 0752      Component Value Date/Time   TSH 2.370 12/14/2021 0828   Nutritional Lab Results  Component Value Date   VD25OH 25.6 (L) 12/14/2021   VD25OH 29.7 (L) 03/23/2021    Attestations:   Reviewed by clinician on day of visit: allergies, medications, problem list, medical history, surgical history, family history, social history, and previous encounter notes.   I,Special Puri,acting as a Neurosurgeon for [employer], DO.,have documented all relevant documentation on the behalf of Thomasene Lot, DO,as directed by  Thomasene Lot, DO  while in the presence of Thomasene Lot, DO.   I, Thomasene Lot, DO, have reviewed all documentation for this visit. The documentation on 07/08/22 for the exam, diagnosis, procedures, and orders are all accurate  and complete.

## 2022-07-08 NOTE — Progress Notes (Signed)
[name] presents with c/o DUB Cycles have become heavier since her BTL last yr This past month was usually heavy, changed super pads q 1-2 hrs  H/O BTL H/O TSVD x 3 ( largest 8 # 10 oz) Pap smear and mammogram UTD  PE AF VSS Lungs clear Heart RRR Abd soft + BS  A/P DUB  Discussed Tx options. Pt declines any type of hormonal manipulation. Endometrial ablation reviewed. Information provided. R/B/Post op care discussed. Pt desires Will schedule. [facility] in the meantime F/U with post op appt

## 2022-07-08 NOTE — Progress Notes (Signed)
Pt presents for AUB. Began post hysterectomy since 2017. Periods are becoming heavier, crampier, changing large pads every 2-3 hours. Having spotting between periods.

## 2022-07-09 LAB — CBC
Hematocrit: 31.8 % — ABNORMAL LOW (ref 34.0–46.6)
Hemoglobin: 10.2 g/dL — ABNORMAL LOW (ref 11.1–15.9)
MCH: 28.4 pg (ref 26.6–33.0)
MCHC: 32.1 g/dL (ref 31.5–35.7)
MCV: 89 fL (ref 79–97)
Platelets: 326 10*3/uL (ref 150–450)
RBC: 3.59 x10E6/uL — ABNORMAL LOW (ref 3.77–5.28)
RDW: 12.9 % (ref 11.7–15.4)
WBC: 6.6 10*3/uL (ref 3.4–10.8)

## 2022-07-09 LAB — TSH: TSH: 1.81 u[IU]/mL (ref 0.450–4.500)

## 2022-07-13 ENCOUNTER — Telehealth (INDEPENDENT_AMBULATORY_CARE_PROVIDER_SITE_OTHER): Payer: 59 | Admitting: Psychology

## 2022-07-13 DIAGNOSIS — F5089 Other specified eating disorder: Secondary | ICD-10-CM

## 2022-07-13 NOTE — Progress Notes (Signed)
  Office: [phone]  /  Fax: [phone]    Date: Jul 13, 2022    Appointment Start Time: 2:00pm Duration: 28 minutes Provider: Lawerance Cruel, Psy.D. Type of Session: Individual Therapy  Location of Patient: Work (private location) Location of Provider: Provider's Home (private office) Type of Contact: Telepsychological Visit via MyChart Video Visit  Session Content: [name] is a 42 y.o. female presenting for a follow-up appointment to address the previously established treatment goal of increasing coping skills.Today's appointment was a telepsychological visit. [name] provided verbal consent for today's telepsychological appointment and she is aware she is responsible for securing confidentiality on her end of the session. Prior to proceeding with today's appointment, [name]'s physical location at the time of this appointment was obtained as well a phone number she could be reached at in the event of technical difficulties. [name] and this provider participated in today's telepsychological service.   This provider conducted a brief check-in. [name] stated she was doing "well" with weight loss and eating, but noted a change due to increased stress. Further explored and processed. She explained she feels she is "back on track" as she has worked to reduce work-related stressors. [name] further indicated a reduction in emotional eating behaviors, noting she is no longer engaging in binge eating behaviors. [name] was engaged in problem solving to develop a plan to help cope with urges/cravings involving activities to relax, activities to distract, comforting places, people to call and connect with, and activities that help soothe senses. She was observed writing the plan. Overall, [name] was receptive to today's appointment as evidenced by openness to sharing, responsiveness to feedback, and willingness to implement discussed strategies .  Mental Status Examination:  Appearance:  neat Behavior: appropriate to circumstances Mood: neutral Affect: mood congruent Speech: WNL Eye Contact: appropriate Psychomotor Activity: WNL Gait: unable to assess Thought Process: linear, logical, and goal directed and no evidence or endorsement of suicidal, homicidal, and self-harm ideation, plan and intent  Thought Content/Perception: no hallucinations, delusions, bizarre thinking or behavior endorsed or observed Orientation: AAOx4 Memory/Concentration: intact Insight: good Judgment: good  Interventions:  Conducted a brief chart review Provided empathic reflections and validation Provided positive reinforcement Employed supportive psychotherapy interventions to facilitate reduced distress and to improve coping skills with identified stressors Engaged patient in problem solving  DSM-5 Diagnosis(es): F50.89 Other Specified Feeding or Eating Disorder, Emotional and Binge Eating Behaviors  Treatment Goal & Progress: During the initial appointment with this provider, the following treatment goal was established: increase coping skills. [name] has demonstrated progress in her goal as evidenced by increased awareness of hunger patterns, increased awareness of triggers for emotional eating behaviors, reduction in emotional eating behaviors , and reduction in binge eating behaviors. [name] also continues to demonstrate willingness to engage in learned skill(s).  Plan: The next appointment is scheduled for 08/09/2022 at 2:30pm, which will be via MyChart Video Visit. The next session will focus on working towards the established treatment goal.

## 2022-07-19 ENCOUNTER — Telehealth: Payer: Self-pay

## 2022-07-19 NOTE — Telephone Encounter (Signed)
Reached out to the patient to see if she was available for surgery on 7/23 @[facility] at 1:30 pm w/ Dr. Alysia Penna. Patient is available and would like to be scheduled. Provided patient w/ date, time, location and pre-op instructions. Patient is aware she must arrive by 11:30 am.

## 2022-07-21 ENCOUNTER — Other Ambulatory Visit (INDEPENDENT_AMBULATORY_CARE_PROVIDER_SITE_OTHER): Payer: Self-pay | Admitting: Family Medicine

## 2022-07-21 DIAGNOSIS — E559 Vitamin D deficiency, unspecified: Secondary | ICD-10-CM

## 2022-07-29 ENCOUNTER — Ambulatory Visit (INDEPENDENT_AMBULATORY_CARE_PROVIDER_SITE_OTHER): Payer: 59 | Admitting: Family Medicine

## 2022-07-29 ENCOUNTER — Encounter (INDEPENDENT_AMBULATORY_CARE_PROVIDER_SITE_OTHER): Payer: Self-pay | Admitting: Family Medicine

## 2022-07-29 VITALS — BP 119/80 | HR 77 | Temp 98.1°F | Ht 65.0 in | Wt 267.0 lb

## 2022-07-29 DIAGNOSIS — E538 Deficiency of other specified B group vitamins: Secondary | ICD-10-CM | POA: Diagnosis not present

## 2022-07-29 DIAGNOSIS — E7849 Other hyperlipidemia: Secondary | ICD-10-CM

## 2022-07-29 DIAGNOSIS — E88819 Insulin resistance, unspecified: Secondary | ICD-10-CM | POA: Diagnosis not present

## 2022-07-29 DIAGNOSIS — E559 Vitamin D deficiency, unspecified: Secondary | ICD-10-CM | POA: Diagnosis not present

## 2022-07-29 DIAGNOSIS — Z6841 Body Mass Index (BMI) 40.0 and over, adult: Secondary | ICD-10-CM

## 2022-07-29 DIAGNOSIS — F39 Unspecified mood [affective] disorder: Secondary | ICD-10-CM

## 2022-07-29 MED ORDER — BUPROPION HCL ER (SR) 150 MG PO TB12: 150.0000 mg | ORAL_TABLET | Freq: Two times a day (BID) | ORAL | 0 refills | Status: DC

## 2022-07-29 MED ORDER — METFORMIN HCL 500 MG PO TABS
ORAL_TABLET | ORAL | 0 refills | Status: DC
Start: 2022-07-29 — End: 2022-08-23

## 2022-07-29 NOTE — Progress Notes (Signed)
[name], D.O.  ABFM, ABOM Specializing in Clinical Bariatric Medicine  Office located at: [address] [place], [place]  [address]     Assessment and Plan:   Orders Placed This Encounter  Procedures   CBC with Differential/Platelet   Comprehensive metabolic panel   Hemoglobin A1c   Insulin, random   Lipid Panel With LDL/HDL Ratio   Vitamin B12   VITAMIN D 25 Hydroxy (Vit-D Deficiency, Fractures)    Medications Discontinued During This Encounter  Medication Reason   buPROPion (WELLBUTRIN SR) 150 MG 12 hr tablet Reorder   metFORMIN (GLUCOPHAGE) 500 MG tablet Reorder     Meds ordered this encounter  Medications   buPROPion (WELLBUTRIN SR) 150 MG 12 hr tablet    Sig: Take 1 tablet (150 mg total) by mouth 2 (two) times daily.    Dispense:  60 tablet    Refill:  0   metFORMIN (GLUCOPHAGE) 500 MG tablet    Sig: 1 po with lunch and dinner daily    Dispense:  60 tablet    Refill:  0    30 d supply;  ** OV for RF **   Do not send RF request     Mood disorder (HCC), with emotional eating Assessment: Condition is improving, but not optimized. Denies any SI/HI. Mood is stable. [name] has been meeting with Dr.Barker and has been implementing different strategies to curb emotional eating. She has been compliant and tolerant with Wellbutrin SR 150 mg BID. Denies any adverse effects.  Plan: Continue meeting with Dr.Barker and continue with mood medication Will refill this today.   Continue her prudent nutritional plan that is low in simple carbohydrates, saturated fats and trans fats to goal of 5-10% weight loss to achieve significant health benefits.  Pt encouraged to continually advance exercise and cardiovascular fitness as tolerated throughout weight loss journey.  We will continue to monitor closely alongside PCP / other specialists.    Insulin resistance Assessment: Condition is not optimized. [name] endorses that her cravings for sweets has increased in  the past week. She reports that she typically has summer sugar cravings at this time of the year. She has been compliant with Metformin 500 mg BID. She reports having occasional loose stools, which she feels was a side effect of the Metformin.Symptoms have resolved. No concerns.    Lab Results  Component Value Date   HGBA1C 5.5 12/14/2021   HGBA1C 5.4 03/23/2021   INSULIN 18.7 12/14/2021   INSULIN 16.6 03/23/2021   INSULIN 22.2 03/26/2020    Plan: Recheck labs. Continue with Metformin as prescribed. Will refill this today.    Continue her prudent nutritional plan that is low in simple carbohydrates, saturated fats and trans fats to goal of 5-10% weight loss to achieve significant health benefits.  Pt encouraged to continually advance exercise and cardiovascular fitness as tolerated throughout weight loss journey.    Vitamin D deficiency Assessment: Condition is not at goal. Endorses taking Ergocalciferol 50K IU weekly and is tolerating  well. Denies any N/V/D.   Lab Results  Component Value Date   VD25OH 25.6 (L) 12/14/2021   VD25OH 29.7 (L) 03/23/2021   Plan: Recheck labs. Continue with OTC supplement. Will reorder this today. Weight loss will likely improve availability of vitamin D, thus encouraged [name] to continue with meal plan and their weight loss efforts to further improve this condition.    B12 deficiency Assessment: Her B12 levels are not at goal. No issues with  Cyanocobalamin 300-500 mcg once daily. Denies any adverse effects.  Lab Results  Component Value Date   VITAMINB12 348 12/14/2021   FOLATE 8.1 12/14/2021    Plan: Recheck labs.Continue their prudent nutritional plan and focus on b12 rich foods such as lean red meats; poultry; eggs; seafood; beans, peas, and lentils; nuts and seeds; and soy products We will continue to monitor as deemed clinically necessary.   Other hyperlipidemia Assessment: Condition is diet/exercise controlled and not optimized. [name]  is not taking any cholesterol medications.   Lab Results  Component Value Date   CHOL 203 (H) 12/14/2021   HDL 57 12/14/2021   LDLCALC 135 (H) 12/14/2021   TRIG 58 12/14/2021   Plan: Recheck labs. She agrees to continue with our treatment plan that is low in saturated and trans fats, and low in fatty carbs to improve these numbers. Will continue to monitor condition closely alongside her PCP.    TREATMENT PLAN FOR OBESITY: BMI 45.0-49.9, adult (HCC)-CURRENT BMI 44.43 Morbid obesity (HCC)-START BMI 51.49/DATE 03/26/20  Assessment: [name] is here to discuss her progress with her obesity treatment plan along with follow-up of her obesity related diagnoses. See Medical Weight Management Flowsheet for complete bioelectrical impedance results.  Condition is not optimized Biometric data collected today, was reviewed with patient.   Since last office visit on 07/08/22 patient's  Muscle mass has increased by 3.2 lb. Fat mass has decreased by 8.4 lb.  Counseling done on how various foods will affect these numbers and how to maximize success  Total lbs lost to date: 33  Total weight loss percentage to date: 11.00   Plan: Continue the Category 3 Plan and keeping a food journal and adhering to recommended goals of 1400-1500 calories and 100+ protein.   - Recommended patient to explore the [employer] for new recipe ideas.   Behavioral Intervention Additional resources provided today: Food journaling log Evidence-based interventions for health behavior change were utilized today including the discussion of self monitoring techniques, problem-solving barriers and SMART goal setting techniques.   Regarding patient's less desirable eating habits and patterns, we employed the technique of small changes.  Pt will specifically work on: exploring and making healthy versions of foods she loves for next visit.    Recommended Physical Activity Goals  [name] has been advised to slowly  work up to 150 minutes of moderate intensity aerobic activity a week and strengthening exercises 2-3 times per week for cardiovascular health, weight loss maintenance and preservation of muscle mass.   She has agreed to Continue current level of physical activity   FOLLOW UP: Return in about 2 weeks (around 08/12/2022). She was informed of the importance of frequent follow up visits to maximize her success with intensive lifestyle modifications for her multiple health conditions.  [name] is aware that we will review all of her lab results at our next visit.  She is aware that if anything is critical/ life threatening with the results, we will be contacting her via MyChart prior to the office visit to discuss management.    Subjective:   Chief complaint: Obesity Gamila is here to discuss her progress with her obesity treatment plan. She is on the the Category 3 Plan and keeping a food journal and adhering to recommended goals of 1400-1500 calories and 110+ protein and states she is following her eating plan approximately 80-90% of the time. She states she is walking 10 minutes 3 days per week.  Interval History:  [name] is here for a follow up office visit.     Since last office visit:    - [name] endorses struggling a bit with summer sugar cravings. She is craving foods like smoothies from cookout.   - Her journaling log indicates that on average, she is within her calorie and protein goals. However, on a couple of occasions she was over in calories (2,000+) and under in protein.  Pharmacotherapy for weight loss: She is currently taking  Metformin and Wellbutrin   for medical weight loss.  Denies side effects.    Review of Systems:  Pertinent positives were addressed with patient today.  Reviewed by clinician on day of visit: allergies, medications, problem list, medical history, surgical history, family history, social history, and previous encounter notes.  Weight  Summary and Biometrics   Weight Lost Since Last Visit: 5  Weight Gained Since Last Visit: 0   Vitals Temp: 98.1 F (36.7 C) BP: 119/80 Pulse Rate: 77 SpO2: 97 %   Anthropometric Measurements Height: 5\' 5"  (1.651 m) Weight: 267 lb (121.1 kg) BMI (Calculated): 44.43 Weight at Last Visit: 272 Weight Lost Since Last Visit: 5 Weight Gained Since Last Visit: 0 Starting Weight: 300 Total Weight Loss (lbs): 5 lb (2.268 kg) Peak Weight: 318   Body Composition  Body Fat %: 52.2 % Fat Mass (lbs): 139.6 lbs Muscle Mass (lbs): 121.4 lbs Total Body Water (lbs): 95.2 lbs Visceral Fat Rating : 17   Other Clinical Data Fasting: yes Labs: no Today's Visit #: 38 Starting Date: 03/26/20   Objective:   PHYSICAL EXAM: Blood pressure 119/80, pulse 77, temperature 98.1 F (36.7 C), height 5\' 5"  (1.651 m), weight 267 lb (121.1 kg), last menstrual period 07/04/2022, SpO2 97 %. Body mass index is 44.43 kg/m.  General: Well Developed, well nourished, and in no acute distress.  HEENT: Normocephalic, atraumatic Skin: Warm and dry, cap RF less 2 sec, good turgor Chest:  Normal excursion, shape, no gross abn Respiratory: speaking in full sentences, no conversational dyspnea NeuroM-Sk: Ambulates w/o assistance, moves * 4 Psych: A and O *3, insight good, mood-full  DIAGNOSTIC DATA REVIEWED:  BMET    Component Value Date/Time   NA 142 12/14/2021 0828   NA 138 10/14/2011 0752   K 4.4 12/14/2021 0828   K 4.1 10/14/2011 0752   CL 106 12/14/2021 0828   CL 106 10/14/2011 0752   CO2 22 12/14/2021 0828   CO2 27 10/14/2011 0752   GLUCOSE 85 12/14/2021 0828   GLUCOSE 87 04/16/2016 1123   GLUCOSE 90 10/14/2011 0752   BUN 16 12/14/2021 0828   BUN 11 10/14/2011 0752   CREATININE 0.73 12/14/2021 0828   CREATININE 0.55 (L) 10/14/2011 0752   CALCIUM 9.0 12/14/2021 0828   CALCIUM 9.0 10/14/2011 0752   GFRNONAA >90 01/29/2014 1950   GFRNONAA >60 10/14/2011 0752   GFRAA >90 01/29/2014  1950   GFRAA >60 10/14/2011 0752   Lab Results  Component Value Date   HGBA1C 5.5 12/14/2021   HGBA1C 5.4 03/23/2021   Lab Results  Component Value Date   INSULIN 18.7 12/14/2021   INSULIN 22.2 03/26/2020   Lab Results  Component Value Date   TSH 1.810 07/08/2022   CBC    Component Value Date/Time   WBC 6.6 07/08/2022 1532   WBC 8.0 12/15/2015 0905   RBC 3.59 (L) 07/08/2022 1532   RBC 3.86 (L) 12/15/2015 0905   HGB 10.2 (L) 07/08/2022 1532   HCT 31.8 (L)  07/08/2022 1532   PLT 326 07/08/2022 1532   MCV 89 07/08/2022 1532   MCV 91 10/14/2011 0752   MCH 28.4 07/08/2022 1532   MCH 29.8 12/15/2015 0905   MCHC 32.1 07/08/2022 1532   MCHC 33.0 12/15/2015 0905   RDW 12.9 07/08/2022 1532   RDW 14.1 10/14/2011 0752   Iron Studies No results found for: "IRON", "TIBC", "FERRITIN", "IRONPCTSAT" Lipid Panel     Component Value Date/Time   CHOL 203 (H) 12/14/2021 0828   TRIG 58 12/14/2021 0828   HDL 57 12/14/2021 0828   LDLCALC 135 (H) 12/14/2021 0828   Hepatic Function Panel     Component Value Date/Time   PROT 6.4 12/14/2021 0828   PROT 7.4 10/14/2011 0752   ALBUMIN 4.0 12/14/2021 0828   ALBUMIN 3.8 10/14/2011 0752   AST 12 12/14/2021 0828   AST 8 (L) 10/14/2011 0752   ALT 9 12/14/2021 0828   ALT 13 10/14/2011 0752   ALKPHOS 48 12/14/2021 0828   ALKPHOS 48 (L) 10/14/2011 0752   BILITOT <0.2 12/14/2021 0828   BILITOT 0.4 10/14/2011 0752      Component Value Date/Time   TSH 1.810 07/08/2022 1532   Nutritional Lab Results  Component Value Date   VD25OH 25.6 (L) 12/14/2021   VD25OH 29.7 (L) 03/23/2021    Attestations:   I, Special Puri, acting as a Stage manager for Thomasene Lot, DO., have compiled all relevant documentation for today's office visit on behalf of Thomasene Lot, DO, while in the presence of [employer], DO.  I have reviewed the above documentation for accuracy and completeness, and I agree with the above. [name],  D.O.  The 21st Century Cures Act was signed into law in 2016 which includes the topic of electronic health records.  This provides immediate access to information in MyChart.  This includes consultation notes, operative notes, office notes, lab results and pathology reports.  If you have any questions about what you read please let us know at your next visit so we can discuss your concerns and take corrective action if need be.  We are right here with you.

## 2022-07-30 LAB — CBC WITH DIFFERENTIAL/PLATELET
Basophils Absolute: 0.1 10*3/uL (ref 0.0–0.2)
Basos: 1 %
EOS (ABSOLUTE): 0.2 10*3/uL (ref 0.0–0.4)
Eos: 2 %
Hematocrit: 34.7 % (ref 34.0–46.6)
Hemoglobin: 10.9 g/dL — ABNORMAL LOW (ref 11.1–15.9)
Immature Grans (Abs): 0 10*3/uL (ref 0.0–0.1)
Immature Granulocytes: 1 %
Lymphocytes Absolute: 1.8 10*3/uL (ref 0.7–3.1)
Lymphs: 29 %
MCH: 27.7 pg (ref 26.6–33.0)
MCHC: 31.4 g/dL — ABNORMAL LOW (ref 31.5–35.7)
MCV: 88 fL (ref 79–97)
Monocytes Absolute: 0.5 10*3/uL (ref 0.1–0.9)
Monocytes: 8 %
Neutrophils Absolute: 3.8 10*3/uL (ref 1.4–7.0)
Neutrophils: 59 %
Platelets: 317 10*3/uL (ref 150–450)
RBC: 3.94 x10E6/uL (ref 3.77–5.28)
RDW: 13.3 % (ref 11.7–15.4)
WBC: 6.4 10*3/uL (ref 3.4–10.8)

## 2022-07-30 LAB — COMPREHENSIVE METABOLIC PANEL
ALT: 5 IU/L (ref 0–32)
AST: 10 IU/L (ref 0–40)
Albumin/Globulin Ratio: 1.8
Albumin: 4.6 g/dL (ref 3.9–4.9)
Alkaline Phosphatase: 51 IU/L (ref 44–121)
BUN/Creatinine Ratio: 28 — ABNORMAL HIGH (ref 9–23)
BUN: 19 mg/dL (ref 6–24)
Bilirubin Total: 0.3 mg/dL (ref 0.0–1.2)
CO2: 24 mmol/L (ref 20–29)
Calcium: 9.3 mg/dL (ref 8.7–10.2)
Chloride: 102 mmol/L (ref 96–106)
Creatinine, Ser: 0.68 mg/dL (ref 0.57–1.00)
Globulin, Total: 2.6 g/dL (ref 1.5–4.5)
Glucose: 74 mg/dL (ref 70–99)
Potassium: 4.5 mmol/L (ref 3.5–5.2)
Sodium: 141 mmol/L (ref 134–144)
Total Protein: 7.2 g/dL (ref 6.0–8.5)
eGFR: 111 mL/min/{1.73_m2} (ref >59)

## 2022-07-30 LAB — HEMOGLOBIN A1C
Est. average glucose Bld gHb Est-mCnc: 103 mg/dL
Hgb A1c MFr Bld: 5.2 % (ref 4.8–5.6)

## 2022-07-30 LAB — LIPID PANEL WITH LDL/HDL RATIO
Cholesterol, Total: 203 mg/dL — ABNORMAL HIGH (ref 100–199)
HDL: 58 mg/dL (ref >39)
LDL Chol Calc (NIH): 128 mg/dL — ABNORMAL HIGH (ref 0–99)
LDL/HDL Ratio: 2.2 ratio (ref 0.0–3.2)
Triglycerides: 94 mg/dL (ref 0–149)
VLDL Cholesterol Cal: 17 mg/dL (ref 5–40)

## 2022-07-30 LAB — VITAMIN D 25 HYDROXY (VIT D DEFICIENCY, FRACTURES): Vit D, 25-Hydroxy: 53.3 ng/mL (ref 30.0–100.0)

## 2022-07-30 LAB — INSULIN, RANDOM: INSULIN: 10.9 u[IU]/mL (ref 2.6–24.9)

## 2022-07-30 LAB — VITAMIN B12: Vitamin B-12: 720 pg/mL (ref 232–1245)

## 2022-08-09 ENCOUNTER — Telehealth (INDEPENDENT_AMBULATORY_CARE_PROVIDER_SITE_OTHER): Payer: 59 | Admitting: Psychology

## 2022-08-09 DIAGNOSIS — F5089 Other specified eating disorder: Secondary | ICD-10-CM | POA: Diagnosis not present

## 2022-08-09 NOTE — Progress Notes (Signed)
  Office: [phone]  /  Fax: [phone]    Date: August 09, 2022  Appointment Start Time: 2:33pm Duration: 19 minutes Provider: Lawerance Cruel, Psy.D. Type of Session: Individual Therapy  Location of Patient: Home (private location) Location of Provider: Provider's Home (private office) Type of Contact: Telepsychological Visit via MyChart Video Visit  Session Content: [name] is a 42 y.o. female presenting for a follow-up appointment to address the previously established treatment goal of increasing coping skills.Today's appointment was a telepsychological visit. [name] provided verbal consent for today's telepsychological appointment and she is aware she is responsible for securing confidentiality on her end of the session. Prior to proceeding with today's appointment, [name]'s physical location at the time of this appointment was obtained as well a phone number she could be reached at in the event of technical difficulties. [name] and this provider participated in today's telepsychological service.   This provider conducted a brief check-in. [name] shared she had COVID last week, noting she is experiencing some brain fog. She discussed following her structured meal plan, and described feeling in control. [name] feels reduced work-related stress has helped improve her eating habits, especially reducing emotional/binge eating behaviors. She also discussed implementing the previously developed plan for coping. Psychoeducation regarding mindfulness was provided. A handout was provided to [name] with further information regarding mindfulness, including exercises. This provider also explained the benefit of mindfulness as it relates to emotional eating. [name] was encouraged to engage in the provided exercises between now and the next appointment with this provider. [name] agreed. During today's appointment, [name] was led through a mindfulness exercise involving her senses. [name]  provided verbal consent during today's appointment for this provider to send a handout about mindfulness via e-mail. Furthermore, termination planning was discussed. [name] was receptive to a follow-up appointment in 3-4 weeks and an additional follow-up/termination appointment in 3-4 weeks after that. Overall, [name] was receptive to today's appointment as evidenced by openness to sharing, responsiveness to feedback, and willingness to engage in mindfulness exercises to assist with coping.  Mental Status Examination:  Appearance: neat Behavior: appropriate to circumstances Mood: neutral Affect: mood congruent Speech: WNL Eye Contact: appropriate Psychomotor Activity: WNL Gait: unable to assess Thought Process: linear, logical, and goal directed and no evidence or endorsement of suicidal, homicidal, and self-harm ideation, plan and intent  Thought Content/Perception: no hallucinations, delusions, bizarre thinking or behavior endorsed or observed Orientation: AAOx4 Memory/Concentration: intact Insight: good Judgment: good  Interventions:  Conducted a brief chart review Provided empathic reflections and validation Reviewed content from the previous session Provided positive reinforcement Employed supportive psychotherapy interventions to facilitate reduced distress and to improve coping skills with identified stressors Psychoeducation provided regarding mindfulness Engaged patient in mindfulness exercise(s) Discussed termination planning  DSM-5 Diagnosis(es): F50.89 Other Specified Feeding or Eating Disorder, Emotional and Binge Eating Behaviors  Treatment Goal & Progress: During the initial appointment with this provider, the following treatment goal was established: increase coping skills. [name] has demonstrated progress in her goal as evidenced by increased awareness of hunger patterns, increased awareness of triggers for emotional eating behaviors, reduction in emotional eating  behaviors , and reduction in binge eating behaviors. [name] also continues to demonstrate willingness to engage in learned skill(s).  Plan: The next appointment is scheduled for 09/06/2022 at 8:30am, which will be via MyChart Video Visit. The next session will focus on working towards the established treatment goal.

## 2022-08-23 ENCOUNTER — Ambulatory Visit (INDEPENDENT_AMBULATORY_CARE_PROVIDER_SITE_OTHER): Payer: 59 | Admitting: Family Medicine

## 2022-08-23 ENCOUNTER — Encounter (INDEPENDENT_AMBULATORY_CARE_PROVIDER_SITE_OTHER): Payer: Self-pay | Admitting: Family Medicine

## 2022-08-23 VITALS — BP 126/83 | HR 80 | Temp 98.1°F | Ht 65.0 in | Wt 271.0 lb

## 2022-08-23 DIAGNOSIS — Z6841 Body Mass Index (BMI) 40.0 and over, adult: Secondary | ICD-10-CM

## 2022-08-23 DIAGNOSIS — E88819 Insulin resistance, unspecified: Secondary | ICD-10-CM | POA: Diagnosis not present

## 2022-08-23 DIAGNOSIS — E559 Vitamin D deficiency, unspecified: Secondary | ICD-10-CM

## 2022-08-23 DIAGNOSIS — F39 Unspecified mood [affective] disorder: Secondary | ICD-10-CM | POA: Diagnosis not present

## 2022-08-23 DIAGNOSIS — E7849 Other hyperlipidemia: Secondary | ICD-10-CM

## 2022-08-23 DIAGNOSIS — F5089 Other specified eating disorder: Secondary | ICD-10-CM | POA: Diagnosis not present

## 2022-08-23 DIAGNOSIS — G43829 Menstrual migraine, not intractable, without status migrainosus: Secondary | ICD-10-CM

## 2022-08-23 DIAGNOSIS — E538 Deficiency of other specified B group vitamins: Secondary | ICD-10-CM

## 2022-08-23 MED ORDER — BUPROPION HCL ER (SR) 150 MG PO TB12: 150.0000 mg | ORAL_TABLET | Freq: Two times a day (BID) | ORAL | 0 refills | Status: DC

## 2022-08-23 MED ORDER — TOPIRAMATE 25 MG PO TABS
25.0000 mg | ORAL_TABLET | Freq: Two times a day (BID) | ORAL | 0 refills | Status: DC
Start: 2022-08-23 — End: 2022-09-13

## 2022-08-23 MED ORDER — METFORMIN HCL 500 MG PO TABS: ORAL_TABLET | ORAL | 0 refills | Status: DC

## 2022-08-23 NOTE — Progress Notes (Signed)
[name], D.O.  ABFM, ABOM Specializing in Clinical Bariatric Medicine  Office located at: [address] [place], [place]  [address]     Assessment and Plan:   Medications Discontinued During This Encounter  Medication Reason   buPROPion (WELLBUTRIN SR) 150 MG 12 hr tablet Reorder   metFORMIN (GLUCOPHAGE) 500 MG tablet Reorder     Meds ordered this encounter  Medications   buPROPion (WELLBUTRIN SR) 150 MG 12 hr tablet    Sig: Take 1 tablet (150 mg total) by mouth 2 (two) times daily.    Dispense:  60 tablet    Refill:  0   metFORMIN (GLUCOPHAGE) 500 MG tablet    Sig: 1 po with lunch and dinner daily    Dispense:  60 tablet    Refill:  0    30 d supply;  ** OV for RF **   Do not send RF request   topiramate (TOPAMAX) 25 MG tablet    Sig: Take 1 tablet (25 mg total) by mouth 2 (two) times daily.    Dispense:  60 tablet    Refill:  0     Mood disorder (HCC), with emotional eating Assessment: Condition is stable. She continues to work with Dr. Dewaine Conger, our Bariatric Psychologist. She states that most days she does good with her meal plan, but may sometimes "randomly over-eat" She is compliant and tolerant with Wellbutrin 150 twice daily. Denies any SI/HI. Mood is stable.  Plan: Continue meeting with Dr. Dewaine Conger and continue with Wellbutrin at current dose. Will refill today.   Continue her prudent nutritional plan that is low in simple carbohydrates, saturated fats and trans fats to goal of 5-10% weight loss to achieve significant health benefits.  Pt encouraged to continually advance exercise and cardiovascular fitness as tolerated throughout weight loss journey. We will continue to monitor this condition closely.    Insulin resistance Assessment: Condition is Improving, but not optimized. Her A1c improved from 5.5 to 5.2 and her insulin improved from 18.7 to 10.9 as of 07/29/2022. Her blood counts have improved from prior but are still indicative for mild  anemia. She is taking an unknown dose of OTC iron per her OBGYN. She had a high BUN of 28 which is suggestive of dehydration. Her liver and kidney function appear to be stable. Labs were reviewed with pt today and education provided on them and how the foods patient eats may influence these findings. All questions were answered about them.    Lab Results  Component Value Date   HGBA1C 5.2 07/29/2022   HGBA1C 5.5 12/14/2021   HGBA1C 5.4 03/23/2021   INSULIN 10.9 07/29/2022   INSULIN 18.7 12/14/2021   INSULIN 16.6 03/23/2021    Lab Results  Component Value Date   WBC 6.4 07/29/2022   HGB 10.9 (L) 07/29/2022   HCT 34.7 07/29/2022   MCV 88 07/29/2022   PLT 317 07/29/2022   Lab Results  Component Value Date   CREATININE 0.68 07/29/2022   BUN 19 07/29/2022   NA 141 07/29/2022   K 4.5 07/29/2022   CL 102 07/29/2022   CO2 24 07/29/2022      Component Value Date/Time   PROT 7.2 07/29/2022 0853   PROT 7.4 10/14/2011 0752   ALBUMIN 4.6 07/29/2022 0853   ALBUMIN 3.8 10/14/2011 0752   AST 10 07/29/2022 0853   AST 8 (L) 10/14/2011 0752   ALT 5 07/29/2022 0853   ALT 13 10/14/2011 0752  ALKPHOS 51 07/29/2022 0853   ALKPHOS 48 (L) 10/14/2011 0752   BILITOT 0.3 07/29/2022 0853   BILITOT 0.4 10/14/2011 0752    Plan: Continue with Metformin at current dose. Will refill this today.   Unless pre-existing renal or cardiopulmonary conditions exist which patient was told to limit their fluid intake by another provider, I recommended roughly one half of their weight in pounds, to be the approximate ounces of non-caloric, non-caffeinated beverages they should drink per day; including more if they are engaging in exercise.  She will continue iron supplements as recommended by her OBGYN. [name] will continue to work on weight loss, exercise, via their meal plan we devised to help decrease the risk of progressing to diabetes. We will recheck A1c and fasting insulin level in approximately 3 months  from last check, or as deemed appropriate.    Vitamin D deficiency Assessment: Condition is At goal. Her vitamin D improved from 25.6 to 53.3 on 07/29/2022. She continues to take Ergocalciferol 50K IU weekly. Labs were reviewed with pt today and all questions were answered about them.    Lab Results  Component Value Date   VD25OH 53.3 07/29/2022   VD25OH 25.6 (L) 12/14/2021   VD25OH 29.7 (L) 03/23/2021   Plan:Pt advised to maintain with ERGO at current dose. Pt denies need for refill.   Weight loss will likely improve availability of vitamin D, thus encouraged [name] to continue with meal plan and their weight loss efforts to further improve this condition.  Thus, we will need to monitor levels regularly (every 3-4 mo on average) to keep levels within normal limits and prevent over supplementation.   B12 deficiency Assessment: Condition is At goal. Her B12 imrpoved from 348, 8 months ago to 720 as of 07/29/2022. She continues to take OTC Vitamin b12 300-571mcg supplements once daily. Labs were reviewed with pt today and education provided on them and all questions were answered about them.   Lab Results  Component Value Date   VITAMINB12 720 07/29/2022   Plan: She will continue with her OTC supplement and B12 rich meal plan. We will continue to monitor her condition as deemed necessary.    Other hyperlipidemia Assessment: Condition is improving but not optimized.Condition is diet/exercise controlled. Her LDL has slightly improved from 135 to 128 as of 07/29/2022. Her total cholesterol is unchanged from previous. Her HDL is at goal and > 40. Labs were reviewed with pt today and education provided on them and all questions were answered about them.    Lab Results  Component Value Date   CHOL 203 (H) 07/29/2022   HDL 58 07/29/2022   LDLCALC 128 (H) 07/29/2022   TRIG 94 07/29/2022   Plan: [name] agrees to continue with our treatment plan of a heart-heathy, low cholesterol  meal plan. - I again stressed the importance that patient continue with our prudent nutritional plan that is low in saturated and trans fats, and low in fatty carbs to improve these numbers.  - We recommend: aerobic activity with eventual goal of a minimum of 150+ min wk plus 2 days/ week of resistance or strength training.   - We will continue routine screening as patient continues to achieve health goals along their weight loss journey    Other specified eating disorder Menstrual migraine without status migrainosus, not intractable Assessment: Condition is Not optimized. Pt endorses having increased hunger lately and also sometimes "randomly over eating". She report having 1-2 menstrual migraines per month. She was  on Topamax several years ago and tolerated the medication well. Further more she reports taking Phentramine in the past and lost some weight initially however, the medication was discontinued due to increase headaches.   Plan: Start Topamax 25mg  twice daily as directed. I again reminded the pt of the possible benefits and side effects of Topamax. All questions were answered appropriately.   Continue her prudent nutritional plan that is low in simple carbohydrates, saturated fats and trans fats to goal of 5-10% weight loss to achieve significant health benefits.  Pt encouraged to continually advance exercise and cardiovascular fitness as tolerated throughout weight loss journey. Will continue to monitor condition closely.   TREATMENT PLAN FOR OBESITY: BMI 45.0-49.9, adult (HCC)-CURRENT BMI 46.15 Morbid obesity (HCC)-START BMI 51.49/DATE 03/26/20 Assessment:  [name] is here to discuss her progress with her obesity treatment plan along with follow-up of her obesity related diagnoses. See Medical Weight Management Flowsheet for complete bioelectrical impedance results.  Condition is docourse: not optimized.   Since last office visit on 07/29/2022 patient's  Muscle mass has  increased by 2lb. Fat mass has increased by 2.2lb. Total body water has decreased by 4.4lb.  Counseling done on how various foods will affect these numbers and how to maximize success  Total lbs lost to date: -29lbs Total weight loss percentage to date: -9.67%  Plan: Continue the Category 3 Plan and keeping a food journal and adhering to recommended goals of 1400-1500 calories and 100+ protein.   Behavioral Intervention Additional resources provided today: patient declined Evidence-based interventions for health behavior change were utilized today including the discussion of self monitoring techniques, problem-solving barriers and SMART goal setting techniques.   Regarding patient's less desirable eating habits and patterns, we employed the technique of small changes.  Pt will specifically work on: continue with prescribed meal plan and currentl exercise regimen for next visit.    Recommended Physical Activity Goals  [name] has been advised to slowly work up to 150 minutes of moderate intensity aerobic activity a week and strengthening exercises 2-3 times per week for cardiovascular health, weight loss maintenance and preservation of muscle mass.   She has agreed to Continue current level of physical activity   FOLLOW UP Return in about 3 weeks (around 09/13/2022). She was informed of the importance of frequent follow up visits to maximize her success with intensive lifestyle modifications for her multiple health conditions.  Subjective:   Chief complaint: Obesity [name] is here to discuss her progress with her obesity treatment plan. She is on the the Category 3 Plan and keeping a food journal and adhering to recommended goals of 1400-1500 calories and 110+ protein and states she is following her eating plan approximately 50% of the time. She states she is walking 20 minutes 4 days per week.  Interval History:  [name] is here for a follow up office visit.  Since last OV she  had COVD about 3 weeks ago and lost about 6lbs. She stated that after she recently fell off her meal plan "a lot" in her opinion and her appetite and cravings has increased. She hit her calorie and protein goal 50% of the time in the past 14 days.   Pharmacotherapy for weight loss: She is currently taking Wellbutrin and Metformin for medical weight loss.  Denies side effects.    Review of Systems:  Pertinent positives were addressed with patient today.  Reviewed by clinician on day of visit: allergies, medications, problem list, medical history, surgical  history, family history, social history, and previous encounter notes.  Weight Summary and Biometrics   Weight Lost Since Last Visit: 0lb  Weight Gained Since Last Visit: 4lb    Vitals Temp: 98.1 F (36.7 C) BP: 126/83 Pulse Rate: 80 SpO2: 99 %   Anthropometric Measurements Height: 5\' 5"  (1.651 m) Weight: 271 lb (122.9 kg) BMI (Calculated): 45.1 Weight at Last Visit: 267lb Weight Lost Since Last Visit: 0lb Weight Gained Since Last Visit: 4lb Starting Weight: 300lb Total Weight Loss (lbs): 29 lb (13.2 kg) Peak Weight: 318lb   Body Composition  Body Fat %: 52.2 % Fat Mass (lbs): 141.8 lbs Muscle Mass (lbs): 123.4 lbs Total Body Water (lbs): 99.6 lbs Visceral Fat Rating : 16   Other Clinical Data Fasting: no Labs: no Today's Visit #: 39 Starting Date: 03/26/20   Objective:   PHYSICAL EXAM: Blood pressure 126/83, pulse 80, temperature 98.1 F (36.7 C), height 5\' 5"  (1.651 m), weight 271 lb (122.9 kg), SpO2 99 %. Body mass index is 45.1 kg/m.  General: Well Developed, well nourished, and in no acute distress.  HEENT: Normocephalic, atraumatic Skin: Warm and dry, cap RF less 2 sec, good turgor Chest:  Normal excursion, shape, no gross abn Respiratory: speaking in full sentences, no conversational dyspnea NeuroM-Sk: Ambulates w/o assistance, moves * 4 Psych: A and O *3, insight good, mood-full  DIAGNOSTIC  DATA REVIEWED:  BMET    Component Value Date/Time   NA 141 07/29/2022 0853   NA 138 10/14/2011 0752   K 4.5 07/29/2022 0853   K 4.1 10/14/2011 0752   CL 102 07/29/2022 0853   CL 106 10/14/2011 0752   CO2 24 07/29/2022 0853   CO2 27 10/14/2011 0752   GLUCOSE 74 07/29/2022 0853   GLUCOSE 87 04/16/2016 1123   GLUCOSE 90 10/14/2011 0752   BUN 19 07/29/2022 0853   BUN 11 10/14/2011 0752   CREATININE 0.68 07/29/2022 0853   CREATININE 0.55 (L) 10/14/2011 0752   CALCIUM 9.3 07/29/2022 0853   CALCIUM 9.0 10/14/2011 0752   GFRNONAA >90 01/29/2014 1950   GFRNONAA >60 10/14/2011 0752   GFRAA >90 01/29/2014 1950   GFRAA >60 10/14/2011 0752   Lab Results  Component Value Date   HGBA1C 5.2 07/29/2022   HGBA1C 5.4 03/23/2021   Lab Results  Component Value Date   INSULIN 10.9 07/29/2022   INSULIN 22.2 03/26/2020   Lab Results  Component Value Date   TSH 1.810 07/08/2022   CBC    Component Value Date/Time   WBC 6.4 07/29/2022 0853   WBC 8.0 12/15/2015 0905   RBC 3.94 07/29/2022 0853   RBC 3.86 (L) 12/15/2015 0905   HGB 10.9 (L) 07/29/2022 0853   HCT 34.7 07/29/2022 0853   PLT 317 07/29/2022 0853   MCV 88 07/29/2022 0853   MCV 91 10/14/2011 0752   MCH 27.7 07/29/2022 0853   MCH 29.8 12/15/2015 0905   MCHC 31.4 (L) 07/29/2022 0853   MCHC 33.0 12/15/2015 0905   RDW 13.3 07/29/2022 0853   RDW 14.1 10/14/2011 0752   Iron Studies No results found for: "IRON", "TIBC", "FERRITIN", "IRONPCTSAT" Lipid Panel     Component Value Date/Time   CHOL 203 (H) 07/29/2022 0853   TRIG 94 07/29/2022 0853   HDL 58 07/29/2022 0853   LDLCALC 128 (H) 07/29/2022 0853   Hepatic Function Panel     Component Value Date/Time   PROT 7.2 07/29/2022 0853   PROT 7.4 10/14/2011 0752   ALBUMIN 4.6  07/29/2022 0853   ALBUMIN 3.8 10/14/2011 0752   AST 10 07/29/2022 0853   AST 8 (L) 10/14/2011 0752   ALT 5 07/29/2022 0853   ALT 13 10/14/2011 0752   ALKPHOS 51 07/29/2022 0853   ALKPHOS 48 (L)  10/14/2011 0752   BILITOT 0.3 07/29/2022 0853   BILITOT 0.4 10/14/2011 0752      Component Value Date/Time   TSH 1.810 07/08/2022 1532   Nutritional Lab Results  Component Value Date   VD25OH 53.3 07/29/2022   VD25OH 25.6 (L) 12/14/2021   VD25OH 29.7 (L) 03/23/2021    Attestations:   This encounter took 40 total minutes of time including any pre-visit and post-visit time spent on this date of service, including taking a thorough history, reviewing any labs and/or imaging, reviewing prior notes, as well as documenting in the electronic health record on the date of service. Over 50% of that time was in direct face-to-face counseling and coordinating care for the patient today  I, Special Randolm Idol, acting as a Stage manager for Thomasene Lot, DO., have compiled all relevant documentation for today's office visit on behalf of Thomasene Lot, DO, while in the presence of [employer], DO.  I have reviewed the above documentation for accuracy and completeness, and I agree with the above. [name], D.O.  The 21st Century Cures Act was signed into law in 2016 which includes the topic of electronic health records.  This provides immediate access to information in MyChart.  This includes consultation notes, operative notes, office notes, lab results and pathology reports.  If you have any questions about what you read please let us know at your next visit so we can discuss your concerns and take corrective action if need be.  We are right here with you.

## 2022-08-30 NOTE — H&P (Signed)
[name] is an 42 y.o. female. G2P2 with H/O BTL. Has noted [facility] since BTL.Marland Kitchen Monthly cycles last 7 days, and very heavy first few days. GYN U/S normal. Declines hormonal manipulation.   H/O BTL H/O TSVD  Menstrual History: Menarche age: 84 No LMP recorded.    Past Medical History:  Diagnosis Date   Anemia    Anxiety    Back pain    Drug use    Edema of both lower extremities    Gallbladder problem    Headache    Migraines   History of kidney stones    IBS (irritable bowel syndrome)    Joint pain    Kidney stones    PCOS (polycystic ovarian syndrome)    PONV (postoperative nausea and vomiting)    Renal calculus or stone 04/01/2020   Sleep apnea    Vitamin D deficiency     Past Surgical History:  Procedure Laterality Date   CHOLECYSTECTOMY     IUD REMOVAL N/A 12/25/2015   Procedure: INTRAUTERINE DEVICE (IUD) REMOVAL;  Surgeon: Hermina Staggers, MD;  Location: [facility];  Service: Gynecology;  Laterality: N/A;   LAPAROSCOPIC BILATERAL SALPINGECTOMY Bilateral 12/25/2015   Procedure: LAPAROSCOPIC BILATERAL SALPINGECTOMY;  Surgeon: Hermina Staggers, MD;  Location: [facility];  Service: Gynecology;  Laterality: Bilateral;   LIPOMA EXCISION     LITHOTRIPSY     URETEROSCOPY      Family History  Problem Relation Age of Onset   High blood pressure Mother    High Cholesterol Mother    Heart disease Mother    Thyroid disease Mother    Depression Mother    Anxiety disorder Mother    Sleep apnea Mother    Alcoholism Mother    Drug abuse Mother    Obesity Mother    High blood pressure Father    High Cholesterol Father    Heart disease Father    Sudden death Father    Liver disease Father    Alcoholism Father    Drug abuse Father    Breast cancer Neg Hx     Social History:  reports that she has quit smoking. She uses smokeless tobacco. She reports that she does not drink alcohol and does not use drugs.  Allergies:  Allergies  Allergen Reactions   Morphine And Codeine  Nausea And Vomiting   Sulfa Antibiotics Rash and Other (See Comments)    Dizzy and lightheaded     No medications prior to admission.    Review of Systems  Constitutional: Negative.   HENT: Negative.    Respiratory: Negative.    Cardiovascular: Negative.   Gastrointestinal: Negative.   Genitourinary:  Positive for menstrual problem.    There were no vitals taken for this visit. Physical Exam Constitutional:      Appearance: Normal appearance.  Cardiovascular:     Rate and Rhythm: Normal rate and regular rhythm.  Pulmonary:     Effort: Pulmonary effort is normal.     Breath sounds: Normal breath sounds.  Abdominal:     General: Bowel sounds are normal.     Palpations: Abdomen is soft.  Genitourinary:    Comments: Nl EGBUS, small mobile uterus, no masses or tenderness Neurological:     Mental Status: She is alert.     No results found for this or any previous visit (from the past 24 hour(s)).  No results found.  Assessment/Plan: [facility]  Tx options reviewed with pt. Pt desires endometrial ablation. R/B/Post  op care have been reviewed with pt. Pt has verbalized understanding and desires to proceed.   Hermina Staggers 08/30/2022, 10:57 PM

## 2022-09-06 ENCOUNTER — Telehealth (INDEPENDENT_AMBULATORY_CARE_PROVIDER_SITE_OTHER): Payer: 59 | Admitting: Psychology

## 2022-09-06 DIAGNOSIS — F5089 Other specified eating disorder: Secondary | ICD-10-CM | POA: Diagnosis not present

## 2022-09-06 NOTE — Progress Notes (Signed)
  Office: [phone]  /  Fax: [phone]    Date: September 06, 2022  Appointment Start Time: 8:32am Duration: 23 minutes Provider: Lawerance Cruel, Psy.D. Type of Session: Individual Therapy  Location of Patient: Home (private location) Location of Provider: Provider's Home (private office) Type of Contact: Telepsychological Visit via MyChart Video Visit  Session Content: [name] is a 42 y.o. female presenting for a follow-up appointment to address the previously established treatment goal of increasing coping skills.Today's appointment was a telepsychological visit. [name] provided verbal consent for today's telepsychological appointment and she is aware she is responsible for securing confidentiality on her end of the session. Prior to proceeding with today's appointment, [name]'s physical location at the time of this appointment was obtained as well a phone number she could be reached at in the event of technical difficulties. Sharon and this provider participated in today's telepsychological service.   This provider conducted a brief check-in. [name] shared about her recent vacation, including additional time off from work. Discussed what went well with her eating habits while on vacation and what she could do differently during future vacations. She also discussed engaging in shared mindfulness exercises and provided examples. Positive reinforcement was provided. Notably, [name] discussed challenges with consistently engaging in physical activity. As such, she was engaged in thought challenging and problem solving. She agreed to engage in her physical activity routine prior to having her morning cup of coffee. Overall, [name] was receptive to today's appointment as evidenced by openness to sharing, responsiveness to feedback, and willingness to continue engaging in learned skills.  Mental Status Examination:  Appearance: neat Behavior: appropriate to circumstances Mood:  neutral Affect: mood congruent Speech: WNL Eye Contact: appropriate Psychomotor Activity: WNL Gait: unable to assess Thought Process: linear, logical, and goal directed and no evidence or endorsement of suicidal, homicidal, and self-harm ideation, plan and intent  Thought Content/Perception: no hallucinations, delusions, bizarre thinking or behavior endorsed or observed Orientation: AAOx4 Memory/Concentration: intact Insight: good Judgment: good  Interventions:  Conducted a brief chart review Provided empathic reflections and validation Reviewed content from the previous session Provided positive reinforcement Employed supportive psychotherapy interventions to facilitate reduced distress and to improve coping skills with identified stressors Engaged patient in problem solving Engaged patient in thought challenging/cognitive restructuring   DSM-5 Diagnosis(es): F50.89 Other Specified Feeding or Eating Disorder, Emotional and Binge Eating Behaviors  Treatment Goal & Progress: During the initial appointment with this provider, the following treatment goal was established: increase coping skills. [name] has demonstrated progress in her goal as evidenced by increased awareness of hunger patterns, increased awareness of triggers for emotional eating behaviors, reduction in emotional eating behaviors , and reduction in binge eating behaviors. [name] also continues to demonstrate willingness to engage in learned skill(s).  Plan: The next appointment is scheduled for 10/04/2022 at 8:30am, which will be via MyChart Video Visit. The next session will focus on reviewing learned skills and termination.

## 2022-09-07 ENCOUNTER — Encounter (HOSPITAL_COMMUNITY): Admission: RE | Payer: Self-pay | Source: Home / Self Care

## 2022-09-07 ENCOUNTER — Ambulatory Visit (HOSPITAL_COMMUNITY): Admission: RE | Admit: 2022-09-07 | Payer: 59 | Source: Home / Self Care | Admitting: Obstetrics and Gynecology

## 2022-09-07 DIAGNOSIS — N938 Other specified abnormal uterine and vaginal bleeding: Secondary | ICD-10-CM

## 2022-09-07 SURGERY — DILATATION & CURETTAGE/HYSTEROSCOPY WITH NOVASURE ABLATION
Anesthesia: Choice

## 2022-09-13 ENCOUNTER — Ambulatory Visit (INDEPENDENT_AMBULATORY_CARE_PROVIDER_SITE_OTHER): Payer: 59 | Admitting: Family Medicine

## 2022-09-13 ENCOUNTER — Encounter (INDEPENDENT_AMBULATORY_CARE_PROVIDER_SITE_OTHER): Payer: Self-pay | Admitting: Family Medicine

## 2022-09-13 VITALS — BP 133/83 | HR 71 | Temp 98.3°F | Ht 65.0 in | Wt 271.0 lb

## 2022-09-13 DIAGNOSIS — E559 Vitamin D deficiency, unspecified: Secondary | ICD-10-CM | POA: Diagnosis not present

## 2022-09-13 DIAGNOSIS — E88819 Insulin resistance, unspecified: Secondary | ICD-10-CM

## 2022-09-13 DIAGNOSIS — E7849 Other hyperlipidemia: Secondary | ICD-10-CM | POA: Diagnosis not present

## 2022-09-13 DIAGNOSIS — F39 Unspecified mood [affective] disorder: Secondary | ICD-10-CM

## 2022-09-13 DIAGNOSIS — G43829 Menstrual migraine, not intractable, without status migrainosus: Secondary | ICD-10-CM

## 2022-09-13 DIAGNOSIS — E538 Deficiency of other specified B group vitamins: Secondary | ICD-10-CM

## 2022-09-13 DIAGNOSIS — Z6841 Body Mass Index (BMI) 40.0 and over, adult: Secondary | ICD-10-CM

## 2022-09-13 MED ORDER — VITAMIN D (ERGOCALCIFEROL) 1.25 MG (50000 UNIT) PO CAPS
50000.0000 [IU] | ORAL_CAPSULE | ORAL | 0 refills | Status: AC
Start: 2022-09-13 — End: ?

## 2022-09-13 MED ORDER — TOPIRAMATE ER 50 MG PO CAP24: ORAL_CAPSULE | ORAL | 0 refills | Status: DC

## 2022-09-13 NOTE — Progress Notes (Signed)
[name], D.O.  ABFM, ABOM Specializing in Clinical Bariatric Medicine  Office located at: [address] [place], [place]  [address]     Assessment and Plan:   Medications Discontinued During This Encounter  Medication Reason   topiramate (TOPAMAX) 25 MG tablet    Vitamin D, Ergocalciferol, (DRISDOL) 1.25 MG (50000 UNIT) CAPS capsule Reorder    Meds ordered this encounter  Medications   Topiramate ER (TROKENDI XR) 50 MG CP24    Sig: 1 po qd    Dispense:  30 capsule    Refill:  0   Vitamin D, Ergocalciferol, (DRISDOL) 1.25 MG (50000 UNIT) CAPS capsule    Sig: Take 1 capsule (50,000 Units total) by mouth every 7 (seven) days.    Dispense:  12 capsule    Refill:  0    E55.9    Insulin resistance Assessment: Condition is Improving, but not optimized.Marland Kitchen Her A1c level is at an optimal level at 5.2 but her insulin is still elevated at 10.9 although improved.  She continues Metformin daily and denies any GI upset and N/V/D.  Lab Results  Component Value Date   HGBA1C 5.2 07/29/2022   HGBA1C 5.5 12/14/2021   HGBA1C 5.4 03/23/2021   INSULIN 10.9 07/29/2022   INSULIN 18.7 12/14/2021   INSULIN 16.6 03/23/2021    Plan: - Continue with Metformin 500 1 po with lunch and dinner daily. She denies a need for refill.   - Continue her prudent nutritional plan that is low in simple carbohydrates, saturated fats and trans fats to goal of 5-10% weight loss to achieve significant health benefits.  Pt encouraged to continually advance exercise and cardiovascular fitness as tolerated throughout weight loss journey.  - Anticipatory guidance given.    - We will recheck A1c and fasting insulin level in approximately 3 months from last check, or as deemed appropriate.    B12 deficiency Assessment: Condition is At goal. This is well controlled with OTC  B12 supplement that she takes daily. She tolerates this well with no difficulty.  Lab Results  Component Value Date   VITAMINB12  720 07/29/2022   Plan: - Continue cyanocobalamin 300-553mcg daily.   - Continue to eat b12 rich foods such as lean red meats, poultry, eggs, seafood, beans, peas, and lentils, nuts and seeds; and soy products.  - We will continue to monitor her condition and adjust treatment as deemed clinically necessary.    Vitamin D deficiency Assessment: Condition is At goal.. Her vitamin D level is at goal at 53.3. This is maintained by Ergocalciferol and she is tolerating this well and denies any adverse side effects.  Lab Results  Component Value Date   VD25OH 53.3 07/29/2022   VD25OH 25.6 (L) 12/14/2021   VD25OH 29.7 (L) 03/23/2021   Plan: - Continue with Ergocalciferol 50K IU weekly. Will refill today.   - I discussed the importance of vitamin D to the patient's health and well-being as well as to their ability to lose weight.   - she was encouraged to continue to take the medicine until told otherwise.     - we will need to monitor levels regularly (every 3-4 mo on average) to keep levels within normal limits and prevent over supplementation and adjust treatment as deemed clinically necessary.    Other hyperlipidemia Assessment: Condition is Not at goal.. This is diet/exercise controlled.  Lab Results  Component Value Date   CHOL 203 (H) 07/29/2022   HDL 58 07/29/2022  LDLCALC 128 (H) 07/29/2022   TRIG 94 07/29/2022   Plan: - We discussed several lifestyle modifications today and she will continue to work on diet, exercise and weight loss efforts.   - He is to continue with our prudent nutritional plan that is low in saturated and trans fats, and low in fatty carbs to improve these numbers.   - We will continue routine screening as patient continues to achieve health goals along their weight loss journey and adjust treatment plan as deemed clinically necessary.    Menstrual migraine without status migrainosus, not intractable Assessment: Condition is Controlled.. She endorses  that taking Topamax controls her migraines but does make her sleepy.   Plan: - I will switch her to Topiramate 50mg  long lasting 24hr release to help alleviate her sleepiness.  - We will continue to monitor this condition and adjust dose as necessary.    Mood disorder (HCC), with emotional eating Assessment: Condition is stable Denies any SI/HI. Mood is stable. She continues to se Dr. Dewaine Conger and enjoys her meetings. She continues Wellbutrin and tolerates this well. She denies any adverse side effects.   Plan: - Continue Wellbutrin 150mg  twice daily. She denies need for refill.   - Continue to meet with Dr. Dewaine Conger as needed.   - Reminded patient of the importance of following their prudent nutrition plan and how food can affect mood as well to support emotional wellbeing. We will continue to monitor closely.   TREATMENT PLAN FOR OBESITY: BMI 45.0-49.9, adult (HCC)-CURRENT BMI 45.1 Morbid obesity (HCC)-START BMI 51.49/DATE 03/26/20 Assessment:  [name] is here to discuss her progress with her obesity treatment plan along with follow-up of her obesity related diagnoses. See Medical Weight Management Flowsheet for complete bioelectrical impedance results.  Condition is not optimized. Biometric data collected today, was reviewed with patient.   Since last office visit on 08/23/2022 patient's  Muscle mass has decreased by 1lb. Fat mass has increased by 1lb. Total body water has increased by 0.4lb.  Counseling done on how various foods will affect these numbers and how to maximize success  Total lbs lost to date: 29lbs Total weight loss percentage to date: 9.67%  Plan: -  Continue the Category 3 Plan and keeping a food journal and adhering to recommended goals of 1400-1500 calories and 100+ protein.   Behavioral Intervention Additional resources provided today:  food log Evidence-based interventions for health behavior change were utilized today including the discussion of self  monitoring techniques, problem-solving barriers and SMART goal setting techniques.   Regarding patient's less desirable eating habits and patterns, we employed the technique of small changes.  Pt will specifically work on: practice conscious meal decisions, journaling, and bring in food log next OV for next visit.    Recommended Physical Activity Goals  [name] has been advised to slowly work up to 150 minutes of moderate intensity aerobic activity a week and strengthening exercises 2-3 times per week for cardiovascular health, weight loss maintenance and preservation of muscle mass.   She has agreed to Continue current level of physical activity   FOLLOW UP: No follow-ups on file. She was informed of the importance of frequent follow up visits to maximize her success with intensive lifestyle modifications for her multiple health conditions.  Subjective:   Chief complaint: Obesity [name] is here to discuss her progress with her obesity treatment plan. She is on the the Category 3 Plan and keeping a food journal and adhering to recommended goals of 1400-1000 calories  and 100+ protein and states she is following her eating plan approximately 75% of the time. She states she is walking 20 mins 3 days per week and pilates 12 minutes 3 days per week.  Interval History:  [name] is here for a follow up office visit.     Since last OV she has been doing but informed me that she has not been journaling while on vacation and not for the week she has been back. She was using a app to track her foods but not consistently. She continues to exercise more regularly but is not hitting her protein goals. She continues to not drink soda. She endorses feeling better physically and emotionally while losing weight.    Pharmacotherapy for weight loss: She is currently taking Topamax and Wellbutrin and Metformin  for medical weight loss.  Denies side effects.    Review of Systems:  Pertinent positives  were addressed with patient today.  Reviewed by clinician on day of visit: allergies, medications, problem list, medical history, surgical history, family history, social history, and previous encounter notes.  Weight Summary and Biometrics   Weight Lost Since Last Visit: 0lb  Weight Gained Since Last Visit: 0lb   Vitals Temp: 98.3 F (36.8 C) BP: 133/83 Pulse Rate: 71 SpO2: 99 %   Anthropometric Measurements Height: 5\' 5"  (1.651 m) Weight: 271 lb (122.9 kg) BMI (Calculated): 45.1 Weight at Last Visit: 271lb Weight Lost Since Last Visit: 0lb Weight Gained Since Last Visit: 0lb Starting Weight: 300lb Total Weight Loss (lbs): 29 lb (13.2 kg) Peak Weight: 318lb   Body Composition  Body Fat %: 52.6 % Fat Mass (lbs): 142.8 lbs Muscle Mass (lbs): 122.4 lbs Total Body Water (lbs): 100 lbs Visceral Fat Rating : 17   Other Clinical Data Fasting: no Labs: no Today's Visit #: 40 Starting Date: 03/26/20   Objective:   PHYSICAL EXAM: Blood pressure 133/83, pulse 71, temperature 98.3 F (36.8 C), height 5\' 5"  (1.651 m), weight 271 lb (122.9 kg), SpO2 99%. Body mass index is 45.1 kg/m.  General: Well Developed, well nourished, and in no acute distress.  HEENT: Normocephalic, atraumatic Skin: Warm and dry, cap RF less 2 sec, good turgor Chest:  Normal excursion, shape, no gross abn Respiratory: speaking in full sentences, no conversational dyspnea NeuroM-Sk: Ambulates w/o assistance, moves * 4 Psych: A and O *3, insight good, mood-full  DIAGNOSTIC DATA REVIEWED:  BMET    Component Value Date/Time   NA 141 07/29/2022 0853   NA 138 10/14/2011 0752   K 4.5 07/29/2022 0853   K 4.1 10/14/2011 0752   CL 102 07/29/2022 0853   CL 106 10/14/2011 0752   CO2 24 07/29/2022 0853   CO2 27 10/14/2011 0752   GLUCOSE 74 07/29/2022 0853   GLUCOSE 87 04/16/2016 1123   GLUCOSE 90 10/14/2011 0752   BUN 19 07/29/2022 0853   BUN 11 10/14/2011 0752   CREATININE 0.68 07/29/2022  0853   CREATININE 0.55 (L) 10/14/2011 0752   CALCIUM 9.3 07/29/2022 0853   CALCIUM 9.0 10/14/2011 0752   GFRNONAA >90 01/29/2014 1950   GFRNONAA >60 10/14/2011 0752   GFRAA >90 01/29/2014 1950   GFRAA >60 10/14/2011 0752   Lab Results  Component Value Date   HGBA1C 5.2 07/29/2022   HGBA1C 5.4 03/23/2021   Lab Results  Component Value Date   INSULIN 10.9 07/29/2022   INSULIN 22.2 03/26/2020   Lab Results  Component Value Date   TSH 1.810 07/08/2022  CBC    Component Value Date/Time   WBC 6.4 07/29/2022 0853   WBC 8.0 12/15/2015 0905   RBC 3.94 07/29/2022 0853   RBC 3.86 (L) 12/15/2015 0905   HGB 10.9 (L) 07/29/2022 0853   HCT 34.7 07/29/2022 0853   PLT 317 07/29/2022 0853   MCV 88 07/29/2022 0853   MCV 91 10/14/2011 0752   MCH 27.7 07/29/2022 0853   MCH 29.8 12/15/2015 0905   MCHC 31.4 (L) 07/29/2022 0853   MCHC 33.0 12/15/2015 0905   RDW 13.3 07/29/2022 0853   RDW 14.1 10/14/2011 0752   Iron Studies No results found for: "IRON", "TIBC", "FERRITIN", "IRONPCTSAT" Lipid Panel     Component Value Date/Time   CHOL 203 (H) 07/29/2022 0853   TRIG 94 07/29/2022 0853   HDL 58 07/29/2022 0853   LDLCALC 128 (H) 07/29/2022 0853   Hepatic Function Panel     Component Value Date/Time   PROT 7.2 07/29/2022 0853   PROT 7.4 10/14/2011 0752   ALBUMIN 4.6 07/29/2022 0853   ALBUMIN 3.8 10/14/2011 0752   AST 10 07/29/2022 0853   AST 8 (L) 10/14/2011 0752   ALT 5 07/29/2022 0853   ALT 13 10/14/2011 0752   ALKPHOS 51 07/29/2022 0853   ALKPHOS 48 (L) 10/14/2011 0752   BILITOT 0.3 07/29/2022 0853   BILITOT 0.4 10/14/2011 0752      Component Value Date/Time   TSH 1.810 07/08/2022 1532   Nutritional Lab Results  Component Value Date   VD25OH 53.3 07/29/2022   VD25OH 25.6 (L) 12/14/2021   VD25OH 29.7 (L) 03/23/2021    Attestations:   I, Clinical biochemist, acting as a Stage manager for [employer], DO., have compiled all relevant documentation for today's  office visit on behalf of Thomasene Lot, DO, while in the presence of [employer], DO.  I have reviewed the above documentation for accuracy and completeness, and I agree with the above. [name], D.O.  The 21st Century Cures Act was signed into law in 2016 which includes the topic of electronic health records.  This provides immediate access to information in MyChart.  This includes consultation notes, operative notes, office notes, lab results and pathology reports.  If you have any questions about what you read please let us know at your next visit so we can discuss your concerns and take corrective action if need be.  We are right here with you.

## 2022-10-04 ENCOUNTER — Telehealth (INDEPENDENT_AMBULATORY_CARE_PROVIDER_SITE_OTHER): Payer: Self-pay | Admitting: Psychology

## 2022-10-04 ENCOUNTER — Telehealth (INDEPENDENT_AMBULATORY_CARE_PROVIDER_SITE_OTHER): Payer: 59 | Admitting: Psychology

## 2022-10-04 NOTE — Telephone Encounter (Signed)
  Office: [phone]  /  Fax: [phone]  Date of Call: October 04, 2022  Time of Call: 8:33am Provider: Lawerance Cruel, PsyD  CONTENT: This provider called [name] to check-in as she did not present for today's MyChart Video Visit appointment. A HIPAA compliant voicemail was left requesting a call back. Of note, this provider stayed on the MyChart Video Visit appointment for 5 minutes prior to signing off per the clinic's grace period policy.    PLAN: This provider will wait for Orlandria to call back. No further follow-up planned by this provider.

## 2022-10-04 NOTE — Progress Notes (Unsigned)
  Office: [phone]  /  Fax: [phone]    Date: October 04, 2022  Appointment Start Time: *** Duration: *** minutes Provider: Lawerance Cruel, Psy.D. Type of Session: Individual Therapy  Location of Patient: {gbptloc:23249} (private location) Location of Provider: Provider's Home (private office) Type of Contact: Telepsychological Visit via MyChart Video Visit  Session Content: This provider called [name] at 8:33am as she did not present for today's appointment. A HIPAA compliant voicemail was left requesting a call back.  As such, today's appointment was initiated *** minutes late. [name] is a 42 y.o. female presenting for a follow-up appointment to address the previously established treatment goal of increasing coping skills.Today's appointment was a telepsychological visit. [name] provided verbal consent for today's telepsychological appointment and she is aware she is responsible for securing confidentiality on her end of the session. Prior to proceeding with today's appointment, [name]'s physical location at the time of this appointment was obtained as well a phone number she could be reached at in the event of technical difficulties. [name] and this provider participated in today's telepsychological service.   This provider conducted a brief check-in. *** A plan was developed to help [name] cope with emotional eating in the future using learned skills. She wrote down the following plan: focus on hydration; be prepared with snacks congruent to the meal plan; pause to ask questions when triggered to eat (e.g., Am I really hungry?, Is there something bothering me?, and Will I feel better if I eat?); and engage in discussed coping strategies after going through the aforementioned questions. Overall, [name] was receptive to today's appointment as evidenced by openness to sharing, responsiveness to feedback, and {gbreceptiveness:23401}.  Mental Status Examination:  Appearance:  {Appearance:22431} Behavior: {Behavior:22445} Mood: {gbmood:21757} Affect: {Affect:22436} Speech: {Speech:22432} Eye Contact: {Eye Contact:22433} Psychomotor Activity: {Motor Activity:22434} Gait: {gbgait:23404} Thought Process: {thought process:22448}  Thought Content/Perception: {disturbances:22451} Orientation: {Orientation:22437} Memory/Concentration: {gbcognition:22449} Insight: {Insight:22446} Judgment: {Insight:22446}  Interventions:  {Interventions for Progress Notes:23405}  DSM-5 Diagnosis(es): F50.89 Other Specified Feeding or Eating Disorder, Emotional and Binge Eating Behaviors  Treatment Goal & Progress: During the initial appointment with this provider, the following treatment goal was established: increase coping skills. [name] has demonstrated progress in her goal as evidenced by {gbtxprogress:22839}. [name] also {gbtxprogress2:22951}.  Plan: As previously planned, today was [name]'s last appointment with this provider. She acknowledged understanding that she may request a follow-up appointment with this provider in the future as long as she is still established with the clinic. No further follow-up planned by this provider.

## 2022-10-07 ENCOUNTER — Ambulatory Visit (INDEPENDENT_AMBULATORY_CARE_PROVIDER_SITE_OTHER): Payer: 59 | Admitting: Family Medicine

## 2022-10-07 ENCOUNTER — Other Ambulatory Visit (HOSPITAL_COMMUNITY): Payer: Self-pay

## 2022-10-07 ENCOUNTER — Encounter (INDEPENDENT_AMBULATORY_CARE_PROVIDER_SITE_OTHER): Payer: Self-pay | Admitting: Family Medicine

## 2022-10-07 VITALS — BP 119/83 | HR 87 | Temp 98.0°F | Ht 65.0 in | Wt 266.0 lb

## 2022-10-07 DIAGNOSIS — Z6841 Body Mass Index (BMI) 40.0 and over, adult: Secondary | ICD-10-CM

## 2022-10-07 DIAGNOSIS — E88819 Insulin resistance, unspecified: Secondary | ICD-10-CM

## 2022-10-07 DIAGNOSIS — G43829 Menstrual migraine, not intractable, without status migrainosus: Secondary | ICD-10-CM

## 2022-10-07 MED ORDER — METFORMIN HCL ER 500 MG PO TB24
500.0000 mg | ORAL_TABLET | Freq: Every day | ORAL | 0 refills | Status: DC
Start: 2022-10-07 — End: 2022-10-28
  Filled 2022-10-07: qty 30, 30d supply, fill #0

## 2022-10-07 MED ORDER — TOPIRAMATE ER 50 MG PO CAP24
50.0000 mg | ORAL_CAPSULE | Freq: Every day | ORAL | 0 refills | Status: DC
Start: 2022-10-07 — End: 2022-10-28
  Filled 2022-10-07: qty 30, 30d supply, fill #0

## 2022-10-07 NOTE — Progress Notes (Signed)
[name], D.O.  ABFM, ABOM Specializing in Clinical Bariatric Medicine  Office located at: [address] [place], [place]  [address]     Assessment and Plan:   Medications Discontinued During This Encounter  Medication Reason   metFORMIN (GLUCOPHAGE) 500 MG tablet    Topiramate ER (TROKENDI XR) 50 MG CP24 Reorder     Meds ordered this encounter  Medications   Topiramate ER (TROKENDI XR) 50 MG CP24    Sig: 1 po qd    Dispense:  30 capsule    Refill:  0   metFORMIN (GLUCOPHAGE-XR) 500 MG 24 hr tablet    Sig: 1 po every day with food    Dispense:  30 tablet    Refill:  0     Insulin resistance Assessment & Plan: Condition treated with Metformin 500 mg bid - reports sometimes having GI side effects  Lab Results  Component Value Date   HGBA1C 5.2 07/29/2022   HGBA1C 5.5 12/14/2021   HGBA1C 5.4 03/23/2021   INSULIN 10.9 07/29/2022   INSULIN 18.7 12/14/2021   INSULIN 16.6 03/23/2021    Switch to Metformin 500 mg 24 hr tablet. Continue to decrease simple carbs/ sugars; increase fiber and proteins -> follow her meal plan. Will continue to monitor condition.   Menstrual migraine without status migrainosus, not intractable Assessment & Plan: Pt reports not taking Trokendi XR 50 mg due to difficulties with obtaining medication from pharmacy. Has been taking Toparimate 12 hr 25 mg once daily instead.  Feels that her hunger is controlled. Migraines are stable.  Will refill Trokendi XR today - pt will try obtaining from [facility] I encouraged the patient to drink approximately half of their body weight ( in pounds) in ounces of water daily.  Will continue to monitor condition.   TREATMENT PLAN FOR OBESITY: BMI 45.0-49.9, adult (HCC)-CURRENT BMI 44.26 Morbid obesity (HCC)-START BMI 51.49/DATE 03/26/20 Assessment & Plan: [name] is here to discuss her progress with her obesity treatment plan along with follow-up of her obesity related diagnoses. See  Medical Weight Management Flowsheet for complete bioelectrical impedance results.  Since last office visit on 09/13/22 patient's Muscle mass has increased by 0.6 lb. Fat mass has decreased by 6.2 lb. Total body water has decreased by 5.4 lb.  Counseling done on how various foods will affect these numbers and how to maximize success  Total lbs lost to date: 34 lbs  Total weight loss percentage to date: 11.33%   No change to meal plan - see Subjective   - Pt requested some Vegetarian options, so I provided her with the Vegetarian Plan handout as a guide.   - Recommended her to try Edamame/Black Bean Spaghetti and [employer] Organic Marinara Sauce.   Behavioral Intervention Additional resources provided today:  Vegetarian Plan Handout & Healthy Eating Principles Handout Evidence-based interventions for health behavior change were utilized today including the discussion of self monitoring techniques, problem-solving barriers and SMART goal setting techniques.   Regarding patient's less desirable eating habits and patterns, we employed the technique of small changes.  Pt will specifically work on: doing pilates 12 minutes, 4 days a wk for next visit.    FOLLOW UP: Return in about 3 weeks (around 10/28/2022). She was informed of the importance of frequent follow up visits to maximize her success with intensive lifestyle modifications for her multiple health conditions.  Subjective:   Chief complaint: Obesity [name] is here to discuss her progress with her obesity  treatment plan. She is on the Category 3 Plan and keeping a food journal and adhering to recommended goals of 1400-1500 calories and 100+ protein and states she is following her eating plan approximately 90% of the time. She states she is not exercising.   Interval History:  [name] is here for a follow up office visit. Since last OV, [place] has been doing well. Reports significantly cutting back on eating out ; when she does  eat out, she orders lean proteins and avoids rice, butter, oils, etc.. Has been journaling consistently. Her numbers on arbitrary days: 1430 cal & 132 g protein, 1467 cal & 121 g protein, 1840 cal & 136 g protein, 1510 cal & 128 g protein, 1600 cal & 129 g protein. On 2 occasions, she was either under or over in calories. Notes not doing purposeful exercise, but reports being active during her mountain trip.   Pharmacotherapy for weight loss: She is currently taking  Metformin & Topiramate  for medical weight loss.   Review of Systems:  Pertinent positives were addressed with patient today.  Reviewed by clinician on day of visit: allergies, medications, problem list, medical history, surgical history, family history, social history, and previous encounter notes.  Weight Summary and Biometrics   Weight Lost Since Last Visit: 5lb  Weight Gained Since Last Visit: 0lb   Vitals Temp: 98 F (36.7 C) BP: 119/83 Pulse Rate: 87 SpO2: 99 %   Anthropometric Measurements Height: 5\' 5"  (1.651 m) Weight: 266 lb (120.7 kg) BMI (Calculated): 44.26 Weight at Last Visit: 271lb Weight Lost Since Last Visit: 5lb Weight Gained Since Last Visit: 0lb Starting Weight: 300lb Total Weight Loss (lbs): 34 lb (15.4 kg) Peak Weight: 318lb   Body Composition  Body Fat %: 51.3 % Fat Mass (lbs): 136.6 lbs Muscle Mass (lbs): 123 lbs Total Body Water (lbs): 94.6 lbs Visceral Fat Rating : 16   Other Clinical Data Fasting: no Labs: no Today's Visit #: 41 Starting Date: 03/26/20   Objective:   PHYSICAL EXAM: Blood pressure 119/83, pulse 87, temperature 98 F (36.7 C), height 5\' 5"  (1.651 m), weight 266 lb (120.7 kg), SpO2 99%. Body mass index is 44.26 kg/m.  General: Well Developed, well nourished, and in no acute distress.  HEENT: Normocephalic, atraumatic Skin: Warm and dry, cap RF less 2 sec, good turgor Chest:  Normal excursion, shape, no gross abn Respiratory: speaking in full sentences,  no conversational dyspnea NeuroM-Sk: Ambulates w/o assistance, moves * 4 Psych: A and O *3, insight good, mood-full  DIAGNOSTIC DATA REVIEWED:  BMET    Component Value Date/Time   NA 141 07/29/2022 0853   NA 138 10/14/2011 0752   K 4.5 07/29/2022 0853   K 4.1 10/14/2011 0752   CL 102 07/29/2022 0853   CL 106 10/14/2011 0752   CO2 24 07/29/2022 0853   CO2 27 10/14/2011 0752   GLUCOSE 74 07/29/2022 0853   GLUCOSE 87 04/16/2016 1123   GLUCOSE 90 10/14/2011 0752   BUN 19 07/29/2022 0853   BUN 11 10/14/2011 0752   CREATININE 0.68 07/29/2022 0853   CREATININE 0.55 (L) 10/14/2011 0752   CALCIUM 9.3 07/29/2022 0853   CALCIUM 9.0 10/14/2011 0752   GFRNONAA >90 01/29/2014 1950   GFRNONAA >60 10/14/2011 0752   GFRAA >90 01/29/2014 1950   GFRAA >60 10/14/2011 0752   Lab Results  Component Value Date   HGBA1C 5.2 07/29/2022   HGBA1C 5.4 03/23/2021   Lab Results  Component Value Date  INSULIN 10.9 07/29/2022   INSULIN 22.2 03/26/2020   Lab Results  Component Value Date   TSH 1.810 07/08/2022   CBC    Component Value Date/Time   WBC 6.4 07/29/2022 0853   WBC 8.0 12/15/2015 0905   RBC 3.94 07/29/2022 0853   RBC 3.86 (L) 12/15/2015 0905   HGB 10.9 (L) 07/29/2022 0853   HCT 34.7 07/29/2022 0853   PLT 317 07/29/2022 0853   MCV 88 07/29/2022 0853   MCV 91 10/14/2011 0752   MCH 27.7 07/29/2022 0853   MCH 29.8 12/15/2015 0905   MCHC 31.4 (L) 07/29/2022 0853   MCHC 33.0 12/15/2015 0905   RDW 13.3 07/29/2022 0853   RDW 14.1 10/14/2011 0752   Iron Studies No results found for: "IRON", "TIBC", "FERRITIN", "IRONPCTSAT" Lipid Panel     Component Value Date/Time   CHOL 203 (H) 07/29/2022 0853   TRIG 94 07/29/2022 0853   HDL 58 07/29/2022 0853   LDLCALC 128 (H) 07/29/2022 0853   Hepatic Function Panel     Component Value Date/Time   PROT 7.2 07/29/2022 0853   PROT 7.4 10/14/2011 0752   ALBUMIN 4.6 07/29/2022 0853   ALBUMIN 3.8 10/14/2011 0752   AST 10 07/29/2022  0853   AST 8 (L) 10/14/2011 0752   ALT 5 07/29/2022 0853   ALT 13 10/14/2011 0752   ALKPHOS 51 07/29/2022 0853   ALKPHOS 48 (L) 10/14/2011 0752   BILITOT 0.3 07/29/2022 0853   BILITOT 0.4 10/14/2011 0752      Component Value Date/Time   TSH 1.810 07/08/2022 1532   Nutritional Lab Results  Component Value Date   VD25OH 53.3 07/29/2022   VD25OH 25.6 (L) 12/14/2021   VD25OH 29.7 (L) 03/23/2021    Attestations:   Patient was in the office today and time spent on visit including pre-visit chart review and post-visit care/coordination of care and electronic medical record documentation was 44 minutes. 50% of the time was in face to face counseling of this patient's medical condition(s) and providing education on treatment options to include the first-line treatment of diet and lifestyle modification.   I, Special Randolm Idol, acting as a Stage manager for [employer], DO., have compiled all relevant documentation for today's office visit on behalf of Thomasene Lot, DO, while in the presence of [employer], DO.  I have reviewed the above documentation for accuracy and completeness, and I agree with the above. [name], D.O.  The 21st Century Cures Act was signed into law in 2016 which includes the topic of electronic health records.  This provides immediate access to information in MyChart.  This includes consultation notes, operative notes, office notes, lab results and pathology reports.  If you have any questions about what you read please let us know at your next visit so we can discuss your concerns and take corrective action if need be.  We are right here with you.

## 2022-10-08 ENCOUNTER — Other Ambulatory Visit (HOSPITAL_COMMUNITY): Payer: Self-pay

## 2022-10-12 ENCOUNTER — Other Ambulatory Visit (HOSPITAL_COMMUNITY): Payer: Self-pay

## 2022-10-26 ENCOUNTER — Telehealth (INDEPENDENT_AMBULATORY_CARE_PROVIDER_SITE_OTHER): Payer: 59 | Admitting: Psychology

## 2022-10-26 DIAGNOSIS — F5089 Other specified eating disorder: Secondary | ICD-10-CM

## 2022-10-26 NOTE — Progress Notes (Signed)
  Office: [phone]  /  Fax: [phone]    Date: October 26, 2022  Appointment Start Time: 10:01am Duration: 22 minutes Provider: Lawerance Cruel, Psy.D. Type of Session: Individual Therapy  Location of Patient: Parked in car outside of work (address obtained; safe/private location) Location of Provider: Provider's Home (private office) Type of Contact: Telepsychological Visit via MyChart Video Visit  Session Content: Kenae is a 42 y.o. female presenting for a follow-up appointment to address the previously established treatment goal of increasing coping skills.Today's appointment was a telepsychological visit. [name] provided verbal consent for today's telepsychological appointment and she is aware she is responsible for securing confidentiality on her end of the session. Prior to proceeding with today's appointment, Early's physical location at the time of this appointment was obtained as well a phone number she could be reached at in the event of technical difficulties. Christyn and this provider participated in today's telepsychological service.   This provider conducted a brief check-in. Shandrell shared, "Things have been going really well." Reviewed progress to date. A plan was developed to help Ravleen cope with emotional and binge eating in the future using learned skills. She wrote down the following plan: focus on hydration; be prepared with snacks congruent to the meal plan/goals; pause to ask questions when triggered to eat (e.g., Am I really hungry?, Is there something bothering me?, and Will I feel better if I eat?); and engage in discussed coping strategies (e.g., 5-4-3-2-1 exercise ) after going through the aforementioned questions. Overall, Cezanne was receptive to today's appointment as evidenced by openness to sharing, responsiveness to feedback, and willingness to continue engaging in learned skills.  Mental Status Examination:  Appearance: neat Behavior:  appropriate to circumstances Mood: neutral Affect: mood congruent Speech: WNL Eye Contact: appropriate Psychomotor Activity: WNL Gait: unable to assess Thought Process: linear, logical, and goal directed and no evidence or endorsement of suicidal, homicidal, and self-harm ideation, plan and intent  Thought Content/Perception: no hallucinations, delusions, bizarre thinking or behavior endorsed or observed Orientation: AAOx4 Memory/Concentration: intact Insight: good Judgment: good  Interventions:  Conducted a brief chart review Provided empathic reflections and validation Provided positive reinforcement Employed supportive psychotherapy interventions to facilitate reduced distress and to improve coping skills with identified stressors Reviewed learned skills  DSM-5 Diagnosis(es): F50.89 Other Specified Feeding or Eating Disorder, Emotional and Binge Eating Behaviors  Treatment Goal & Progress: During the initial appointment with this provider, the following treatment goal was established: increase coping skills. Nytasia demonstrated progress in her goal as evidenced by increased awareness of hunger patterns, increased awareness of triggers for emotional eating behaviors, reduction in emotional eating behaviors , and reduction in binge eating behaviors. Orea also continues to demonstrate willingness to engage in learned skill(s).  Plan: As previously planned, today was Mirinda's last appointment with this provider. She acknowledged understanding that she may request a follow-up appointment with this provider in the future as long as she is still established with the clinic. No further follow-up planned by this provider.

## 2022-10-28 ENCOUNTER — Encounter (INDEPENDENT_AMBULATORY_CARE_PROVIDER_SITE_OTHER): Payer: Self-pay | Admitting: Family Medicine

## 2022-10-28 ENCOUNTER — Telehealth (INDEPENDENT_AMBULATORY_CARE_PROVIDER_SITE_OTHER): Payer: 59 | Admitting: Family Medicine

## 2022-10-28 ENCOUNTER — Other Ambulatory Visit (HOSPITAL_COMMUNITY): Payer: Self-pay

## 2022-10-28 VITALS — Ht 65.0 in | Wt 266.0 lb

## 2022-10-28 DIAGNOSIS — F5089 Other specified eating disorder: Secondary | ICD-10-CM | POA: Diagnosis not present

## 2022-10-28 DIAGNOSIS — E88819 Insulin resistance, unspecified: Secondary | ICD-10-CM

## 2022-10-28 DIAGNOSIS — G43829 Menstrual migraine, not intractable, without status migrainosus: Secondary | ICD-10-CM

## 2022-10-28 DIAGNOSIS — Z6841 Body Mass Index (BMI) 40.0 and over, adult: Secondary | ICD-10-CM

## 2022-10-28 MED ORDER — METFORMIN HCL ER 500 MG PO TB24
500.0000 mg | ORAL_TABLET | Freq: Every day | ORAL | 0 refills | Status: AC
Start: 2022-10-28 — End: ?
  Filled 2022-10-28: qty 30, 30d supply, fill #0

## 2022-10-28 MED ORDER — BUPROPION HCL ER (XL) 150 MG PO TB24
150.0000 mg | ORAL_TABLET | ORAL | 0 refills | Status: DC
  Filled 2022-10-28: qty 30, 30d supply, fill #0

## 2022-10-28 MED ORDER — TOPIRAMATE ER 50 MG PO CAP24
50.0000 mg | ORAL_CAPSULE | Freq: Every day | ORAL | 0 refills | Status: AC
Start: 2022-10-28 — End: ?
  Filled 2022-10-28: qty 30, 30d supply, fill #0

## 2022-10-28 NOTE — Progress Notes (Signed)
[name], D.O.  ABFM, ABOM Specializing in Clinical Bariatric Medicine  Office located at: [address] [place], [place]  [address]    TeleHealth Visit:   This visit was completed with telemedicine (audio/video) technology. [name] has verbally consented to this TeleHealth visit. The patient is located at home, the provider is located at office The participants in this visit include the listed provider, patient, and scribe. The visit was conducted today via MyChart video.  Assessment and Plan:   Medications Discontinued During This Encounter  Medication Reason   buPROPion (WELLBUTRIN SR) 150 MG 12 hr tablet    Topiramate ER (TROKENDI XR) 50 MG CP24 Reorder   metFORMIN (GLUCOPHAGE-XR) 500 MG 24 hr tablet Reorder     Meds ordered this encounter  Medications   Topiramate ER (TROKENDI XR) 50 MG CP24    Sig: Take 1 capsule (50 mg total) by mouth daily.    Dispense:  30 capsule    Refill:  0   metFORMIN (GLUCOPHAGE-XR) 500 MG 24 hr tablet    Sig: Take 1 tablet (500 mg total) by mouth daily with food.    Dispense:  30 tablet    Refill:  0   buPROPion (WELLBUTRIN XL) 150 MG 24 hr tablet    Sig: Take 1 tablet (150 mg total) by mouth every morning.    Dispense:  90 tablet    Refill:  0    Insulin resistance Assessment & Plan: Lab Results  Component Value Date   HGBA1C 5.2 07/29/2022   HGBA1C 5.5 12/14/2021   HGBA1C 5.4 03/23/2021   INSULIN 10.9 07/29/2022   INSULIN 18.7 12/14/2021   INSULIN 16.6 03/23/2021    Pt reports no adverse side effects from Metformin-XR 500 mg daily. Reports "not feeling hungry all the time". Will refill Metformin today - no dose change. [name] will continue to work on weight loss, exercise, & their meal plan.    Other Specified Feeding or Eating Disorder, Emotional and Binge Eating Behaviors Assessment & Plan: Condition treated with Wellbutrin SR 150 mg bid - pt reports sometimes forgetting to take the second tablet. Shared  decision: switch to Wellbutrin XL 150 mg once daily. Will continue to monitor condition.    Menstrual migraine without status migrainosus, not intractable Assessment & Plan: Condition treated with Trokendi XR 50 mg daily - she is tolerating well, without any adverse effects. Migraines are stable. Cravings are well controlled. Will refill Trokendi today, no dose change. Will continue to monitor condition.    BMI 45.0-49.9, adult (HCC)-CURRENT BMI 44.26 Morbid obesity (HCC)-START BMI 51.49/DATE 03/26/20 Assessment & Plan: [name] is here to discuss her progress with her obesity treatment plan along with follow-up of her obesity related diagnoses. See Medical Weight Management Flowsheet for complete bioelectrical impedance results.  Biometric data was not collected today since pt is at home.   Total lbs lost to date since 10/07/22: - 34 lbs Total weight loss percentage to date: 11.33%   No change to meal plan - see Subjective  Behavioral Intervention Additional resources provided today:  NA Evidence-based interventions for health behavior change were utilized today including the discussion of self monitoring techniques, problem-solving barriers and SMART goal setting techniques.   Regarding patient's less desirable eating habits and patterns, we employed the technique of small changes.  Pt will specifically work on: bringing in food log next OV & increasing exercise to 20 minutes, 4 days a wk for next visit.    FOLLOW  UP: Return 11/17/22. She was informed of the importance of frequent follow up visits to maximize her success with intensive lifestyle modifications for her multiple health conditions.  Subjective:   Chief complaint: Obesity [name] is here to discuss her progress with her obesity treatment plan. She is on the Category 3 Plan and keeping a food journal and adhering to recommended goals of 1400-1500 calories and 100+ protein and states she is following her eating plan  approximately 90% of the time. She states she is doing 12 minutes of exercise videos 3 days per week.  Interval History:  [name] is here for a follow up visit. We are doing a Telehealth Visit today because [name] feels under the weather; she thinks it may be Covid. Reports mostly adhering to recommended calorie and protein goals. On two occasions she had 3,800 calories & 2,400 calories. Feels that her hunger and cravings are well controlled.   Pharmacotherapy for weight loss: She is currently taking  Wellbutrin SR 150 mg bid,  Metformin XR 500 mg daily, & Trokendi XR 50 mg daily   for medical weight loss.  Denies side effects.    Review of Systems:  Pertinent positives were addressed with patient today.  Reviewed by clinician on day of visit: allergies, medications, problem list, medical history, surgical history, family history, social history, and previous encounter notes.  Weight Summary and Biometrics   No data recorded No data recorded   No data recorded Anthropometric Measurements Height: 5\' 5"  (1.651 m) Weight: 266 lb (120.7 kg) BMI (Calculated): 44.26   No data recorded Other Clinical Data Fasting: No Labs: No Today's Visit #: 42 Starting Date: 03/26/20 Comments: Virtual Visit   Objective:   PHYSICAL EXAM: Height 5\' 5"  (1.651 m), weight 266 lb (120.7 kg). Body mass index is 44.26 kg/m.  General: Well Developed, well nourished, and in no acute distress.  HEENT: Normocephalic, atraumatic Skin: Warm and dry, cap RF less 2 sec, good turgor Chest:  Normal excursion, shape, no gross abn Respiratory: speaking in full sentences, no conversational dyspnea NeuroM-Sk: Ambulates w/o assistance, moves * 4 Psych: A and O *3, insight good, mood-full  DIAGNOSTIC DATA REVIEWED:  BMET    Component Value Date/Time   NA 141 07/29/2022 0853   NA 138 10/14/2011 0752   K 4.5 07/29/2022 0853   K 4.1 10/14/2011 0752   CL 102 07/29/2022 0853   CL 106 10/14/2011 0752    CO2 24 07/29/2022 0853   CO2 27 10/14/2011 0752   GLUCOSE 74 07/29/2022 0853   GLUCOSE 87 04/16/2016 1123   GLUCOSE 90 10/14/2011 0752   BUN 19 07/29/2022 0853   BUN 11 10/14/2011 0752   CREATININE 0.68 07/29/2022 0853   CREATININE 0.55 (L) 10/14/2011 0752   CALCIUM 9.3 07/29/2022 0853   CALCIUM 9.0 10/14/2011 0752   GFRNONAA >90 01/29/2014 1950   GFRNONAA >60 10/14/2011 0752   GFRAA >90 01/29/2014 1950   GFRAA >60 10/14/2011 0752   Lab Results  Component Value Date   HGBA1C 5.2 07/29/2022   HGBA1C 5.4 03/23/2021   Lab Results  Component Value Date   INSULIN 10.9 07/29/2022   INSULIN 22.2 03/26/2020   Lab Results  Component Value Date   TSH 1.810 07/08/2022   CBC    Component Value Date/Time   WBC 6.4 07/29/2022 0853   WBC 8.0 12/15/2015 0905   RBC 3.94 07/29/2022 0853   RBC 3.86 (L) 12/15/2015 0905   HGB 10.9 (L) 07/29/2022 2956  HCT 34.7 07/29/2022 0853   PLT 317 07/29/2022 0853   MCV 88 07/29/2022 0853   MCV 91 10/14/2011 0752   MCH 27.7 07/29/2022 0853   MCH 29.8 12/15/2015 0905   MCHC 31.4 (L) 07/29/2022 0853   MCHC 33.0 12/15/2015 0905   RDW 13.3 07/29/2022 0853   RDW 14.1 10/14/2011 0752   Iron Studies No results found for: "IRON", "TIBC", "FERRITIN", "IRONPCTSAT" Lipid Panel     Component Value Date/Time   CHOL 203 (H) 07/29/2022 0853   TRIG 94 07/29/2022 0853   HDL 58 07/29/2022 0853   LDLCALC 128 (H) 07/29/2022 0853   Hepatic Function Panel     Component Value Date/Time   PROT 7.2 07/29/2022 0853   PROT 7.4 10/14/2011 0752   ALBUMIN 4.6 07/29/2022 0853   ALBUMIN 3.8 10/14/2011 0752   AST 10 07/29/2022 0853   AST 8 (L) 10/14/2011 0752   ALT 5 07/29/2022 0853   ALT 13 10/14/2011 0752   ALKPHOS 51 07/29/2022 0853   ALKPHOS 48 (L) 10/14/2011 0752   BILITOT 0.3 07/29/2022 0853   BILITOT 0.4 10/14/2011 0752      Component Value Date/Time   TSH 1.810 07/08/2022 1532   Nutritional Lab Results  Component Value Date   VD25OH 53.3  07/29/2022   VD25OH 25.6 (L) 12/14/2021   VD25OH 29.7 (L) 03/23/2021    Attestations:   I, Special Puri, acting as a Stage manager for Thomasene Lot, DO., have compiled all relevant documentation for today's office visit on behalf of Thomasene Lot, DO, while in the presence of [employer], DO.  I have reviewed the above documentation for accuracy and completeness, and I agree with the above. [name], D.O.  The 21st Century Cures Act was signed into law in 2016 which includes the topic of electronic health records.  This provides immediate access to information in MyChart.  This includes consultation notes, operative notes, office notes, lab results and pathology reports.  If you have any questions about what you read please let us know at your next visit so we can discuss your concerns and take corrective action if need be.  We are right here with you.

## 2022-10-29 ENCOUNTER — Other Ambulatory Visit: Payer: Self-pay

## 2022-11-17 ENCOUNTER — Ambulatory Visit (INDEPENDENT_AMBULATORY_CARE_PROVIDER_SITE_OTHER): Payer: 59 | Admitting: Physician Assistant

## 2022-11-17 ENCOUNTER — Ambulatory Visit (INDEPENDENT_AMBULATORY_CARE_PROVIDER_SITE_OTHER): Payer: 59 | Admitting: Family Medicine

## 2022-11-17 ENCOUNTER — Other Ambulatory Visit (HOSPITAL_COMMUNITY): Payer: Self-pay

## 2022-11-17 ENCOUNTER — Other Ambulatory Visit: Payer: Self-pay

## 2022-11-17 ENCOUNTER — Encounter (INDEPENDENT_AMBULATORY_CARE_PROVIDER_SITE_OTHER): Payer: Self-pay | Admitting: Physician Assistant

## 2022-11-17 VITALS — BP 106/72 | HR 79 | Temp 98.3°F | Ht 65.0 in | Wt 267.0 lb

## 2022-11-17 DIAGNOSIS — R632 Polyphagia: Secondary | ICD-10-CM

## 2022-11-17 DIAGNOSIS — F5089 Other specified eating disorder: Secondary | ICD-10-CM | POA: Insufficient documentation

## 2022-11-17 DIAGNOSIS — E88819 Insulin resistance, unspecified: Secondary | ICD-10-CM | POA: Diagnosis not present

## 2022-11-17 DIAGNOSIS — Z6841 Body Mass Index (BMI) 40.0 and over, adult: Secondary | ICD-10-CM

## 2022-11-17 DIAGNOSIS — E559 Vitamin D deficiency, unspecified: Secondary | ICD-10-CM | POA: Diagnosis not present

## 2022-11-17 MED ORDER — TOPIRAMATE ER 25 MG PO CAP24
25.0000 mg | ORAL_CAPSULE | Freq: Every day | ORAL | 0 refills | Status: DC
  Filled 2022-11-17: qty 30, 30d supply, fill #0

## 2022-11-17 MED ORDER — BUPROPION HCL ER (XL) 150 MG PO TB24
150.0000 mg | ORAL_TABLET | ORAL | 0 refills | Status: DC
  Filled 2022-11-17: qty 90, 90d supply, fill #0

## 2022-11-17 MED ORDER — VITAMIN D (ERGOCALCIFEROL) 1.25 MG (50000 UNIT) PO CAPS
50000.0000 [IU] | ORAL_CAPSULE | ORAL | 0 refills | Status: DC
Start: 2022-11-17 — End: 2022-12-09
  Filled 2022-11-17: qty 4, 28d supply, fill #0

## 2022-11-17 MED ORDER — METFORMIN HCL ER 500 MG PO TB24
500.0000 mg | ORAL_TABLET | Freq: Every day | ORAL | 0 refills | Status: DC
Start: 2022-11-17 — End: 2022-12-09
  Filled 2022-11-17: qty 30, 30d supply, fill #0

## 2022-11-17 NOTE — Progress Notes (Signed)
.smr  Office: [phone]  /  Fax: [phone]  WEIGHT SUMMARY AND BIOMETRICS  Vitals Temp: 98.3 F (36.8 C) BP: 106/72 Pulse Rate: 79 SpO2: 100 %   Anthropometric Measurements Height: 5\' 5"  (1.651 m) Weight: 267 lb (121.1 kg) BMI (Calculated): 44.43 Weight at Last Visit: 266 lb Weight Lost Since Last Visit: 0 Weight Gained Since Last Visit: 1 lb Starting Weight: 300 lb Total Weight Loss (lbs): 33 lb (15 kg) Peak Weight: 318 lb   Body Composition  Body Fat %: 52.1 % Fat Mass (lbs): 139.2 lbs Muscle Mass (lbs): 121.6 lbs Total Body Water (lbs): 98.6 lbs Visceral Fat Rating : 16   Other Clinical Data Fasting: no Labs: no Starting Date: 03/26/20     HPI  Chief Complaint: OBESITY  [name] is here to discuss her progress with her obesity treatment plan. She is on the the Category 3 Plan and keeping a food journal and adhering to recommended goals of 1400-1500 calories and 110-120 + grams of  protein and states she is following her eating plan approximately 90-95 % of the time. She states she is exercising 0 minutes 0 times per week. Discussed the use of AI scribe software for clinical note transcription with the patient, who gave verbal consent to proceed.  History of Present Illness     The patient is a 42 year old female with a history of obesity, insulin resistance, vitamin D and B12 deficiencies, and emotional eating/binge eating behaviors. She presents today for a follow-up of her obesity treatment plan.  She reports feeling fatigued, which she attributes to her medication, Trokendi. She describes a decrease in energy levels, which has affected her ability to exercise and maintain her daily activities. She also mentions that she has been feeling tired and has had a decrease in energy levels.  Despite these challenges, the patient has been actively trying to manage her weight through diet and exercise. She has been adhering to a diet plan and has been trying to  incorporate strength training into her routine. However, she reports that her energy levels have been low, which has made it difficult for her to maintain her exercise routine.    Interval History:  Since last office visit she is up 1 lb.  Bio impedence scale reviewed with the patient:  Muscle mass - 1.4 lbs Adipose mass + 2.6 lbs Total body water + 4 lbs.     Pharmacotherapy: She is currently taking  Wellbutrin XL 150 mg daily,  Metformin XR 500 mg daily, & Trokendi XR 50 mg daily   for medical weight loss.  Reports significant fatigue, having trouble getting up in the mornings since starting the Trokendi XR 50 mg daily, but also had a URI during this time and not sure if it's the Trokendi. Felt it is helpful to decrease her cravings.   TREATMENT PLAN FOR OBESITY:  Recommended Dietary Goals  [name] is currently in the action stage of change. As such, her goal is to continue weight management plan. She has agreed to keeping a food journal and adhering to recommended goals of 1400-1500 calories and 110-120+ grams of protein.  Behavioral Intervention  We discussed the following Behavioral Modification Strategies today: increasing lean protein intake, decreasing simple carbohydrates , increasing vegetables, increasing lower glycemic fruits, increasing fiber rich foods, avoiding skipping meals, increasing water intake, decreasing eating out or consumption of processed foods, and making healthy choices when eating convenient foods, emotional eating strategies and understanding the difference between hunger signals and cravings,  work on managing stress, creating time for self-care and relaxation measures, continue to practice mindfulness when eating, and planning for success.  Additional resources provided today: NA  Recommended Physical Activity Goals  [name] has been advised to work up to 150 minutes of moderate intensity aerobic activity a week and strengthening exercises 2-3 times per  week for cardiovascular health, weight loss maintenance and preservation of muscle mass.   She has agreed to Continue current level of physical activity , Think about ways to increase daily physical activity and overcoming barriers to exercise, and Discussed trying a weighted vest to work on muscle building.    Pharmacotherapy We discussed various medication options to help [name] with her weight loss efforts and we both agreed to decrease Trokendi XR to 25 mg daily and monitor level of fatigue closely, and continue Wellbutrin XL 150 mg daily,  Metformin XR 500 mg daily    Return in about 3 weeks (around 12/08/2022).Marland Kitchen She was informed of the importance of frequent follow up visits to maximize her success with intensive lifestyle modifications for her multiple health conditions.  PHYSICAL EXAM:  Blood pressure 106/72, pulse 79, temperature 98.3 F (36.8 C), height 5\' 5"  (1.651 m), weight 267 lb (121.1 kg), SpO2 100%. Body mass index is 44.43 kg/m.  General: She is overweight, cooperative, alert, well developed, and in no acute distress. PSYCH: Has normal mood, affect and thought process.   Cardiovascular: HR 70's BP 106/72 Lungs: Normal breathing effort, no conversational dyspnea. Neuro: no focal deficits.  DIAGNOSTIC DATA REVIEWED:  BMET    Component Value Date/Time   NA 141 07/29/2022 0853   NA 138 10/14/2011 0752   K 4.5 07/29/2022 0853   K 4.1 10/14/2011 0752   CL 102 07/29/2022 0853   CL 106 10/14/2011 0752   CO2 24 07/29/2022 0853   CO2 27 10/14/2011 0752   GLUCOSE 74 07/29/2022 0853   GLUCOSE 87 04/16/2016 1123   GLUCOSE 90 10/14/2011 0752   BUN 19 07/29/2022 0853   BUN 11 10/14/2011 0752   CREATININE 0.68 07/29/2022 0853   CREATININE 0.55 (L) 10/14/2011 0752   CALCIUM 9.3 07/29/2022 0853   CALCIUM 9.0 10/14/2011 0752   GFRNONAA >90 01/29/2014 1950   GFRNONAA >60 10/14/2011 0752   GFRAA >90 01/29/2014 1950   GFRAA >60 10/14/2011 0752   Lab Results   Component Value Date   HGBA1C 5.2 07/29/2022   HGBA1C 5.4 03/23/2021   Lab Results  Component Value Date   INSULIN 10.9 07/29/2022   INSULIN 22.2 03/26/2020   Lab Results  Component Value Date   TSH 1.810 07/08/2022   CBC    Component Value Date/Time   WBC 6.4 07/29/2022 0853   WBC 8.0 12/15/2015 0905   RBC 3.94 07/29/2022 0853   RBC 3.86 (L) 12/15/2015 0905   HGB 10.9 (L) 07/29/2022 0853   HCT 34.7 07/29/2022 0853   PLT 317 07/29/2022 0853   MCV 88 07/29/2022 0853   MCV 91 10/14/2011 0752   MCH 27.7 07/29/2022 0853   MCH 29.8 12/15/2015 0905   MCHC 31.4 (L) 07/29/2022 0853   MCHC 33.0 12/15/2015 0905   RDW 13.3 07/29/2022 0853   RDW 14.1 10/14/2011 0752   Iron Studies No results found for: "IRON", "TIBC", "FERRITIN", "IRONPCTSAT" Lipid Panel     Component Value Date/Time   CHOL 203 (H) 07/29/2022 0853   TRIG 94 07/29/2022 0853   HDL 58 07/29/2022 0853   LDLCALC 128 (H) 07/29/2022 1610  Hepatic Function Panel     Component Value Date/Time   PROT 7.2 07/29/2022 0853   PROT 7.4 10/14/2011 0752   ALBUMIN 4.6 07/29/2022 0853   ALBUMIN 3.8 10/14/2011 0752   AST 10 07/29/2022 0853   AST 8 (L) 10/14/2011 0752   ALT 5 07/29/2022 0853   ALT 13 10/14/2011 0752   ALKPHOS 51 07/29/2022 0853   ALKPHOS 48 (L) 10/14/2011 0752   BILITOT 0.3 07/29/2022 0853   BILITOT 0.4 10/14/2011 0752      Component Value Date/Time   TSH 1.810 07/08/2022 1532   Nutritional Lab Results  Component Value Date   VD25OH 53.3 07/29/2022   VD25OH 25.6 (L) 12/14/2021   VD25OH 29.7 (L) 03/23/2021    ASSOCIATED CONDITIONS ADDRESSED TODAY  ASSESSMENT AND PLAN  Problem List Items Addressed This Visit     Morbid obesity (HCC)   Relevant Medications   metFORMIN (GLUCOPHAGE-XR) 500 MG 24 hr tablet   Insulin resistance - Primary   Relevant Medications   metFORMIN (GLUCOPHAGE-XR) 500 MG 24 hr tablet   Polyphagia   Relevant Medications   buPROPion (WELLBUTRIN XL) 150 MG 24 hr  tablet   Topiramate ER (TROKENDI XR) 25 MG CP24   Vitamin D deficiency   Relevant Medications   Vitamin D, Ergocalciferol, (DRISDOL) 1.25 MG (50000 UNIT) CAPS capsule   Other Specified Feeding or Eating Disorder, Emotional and Binge Eating Behaviors   Relevant Medications   buPROPion (WELLBUTRIN XL) 150 MG 24 hr tablet   Other Visit Diagnoses     BMI 40.0-44.9, adult (HCC) Current BMI 44.5       Relevant Medications   metFORMIN (GLUCOPHAGE-XR) 500 MG 24 hr tablet     Insulin Resistance Last fasting insulin was 10.9- not at goal. A1c was 5.2- at goal. Polyphagia:No Medication(s): Topiramate XR 50 mg , metformin XR 500 mg daily and Wellbutrin XL 150 mg daily.  Patient reports fatigue possibly related to Trokendi (topiramate) and recent illness/ URI-possibly Covid.  She has been adhering to her diet but has struggled with exercise due to fatigue. She has lost some muscle mass and gained adipose mass. She is on metformin, Wellbutrin (bupropion), and Trokendi. -Reduce Trokendi to 25mg  daily to assess if fatigue improves. -Consider increasing Wellbutrin at next visit if fatigue persists. -Encourage patient to continue diet and exercise regimen, including strength training. -Consider use of a weighted vest during walks to increase muscle mass. -Continue metformin and Wellbutrin as prescribed. Lab Results  Component Value Date   HGBA1C 5.2 07/29/2022   HGBA1C 5.5 12/14/2021   HGBA1C 5.4 03/23/2021   Lab Results  Component Value Date   INSULIN 10.9 07/29/2022   INSULIN 18.7 12/14/2021   INSULIN 16.6 03/23/2021   INSULIN 22.2 03/26/2020    Plan: Continue and refill metformin XL 500 mg daily.  -Reduce Trokendi XR to 25mg  daily to assess if fatigue improves.  -Consider increasing Wellbutrin at next visit if fatigue persists. -Encourage patient to continue diet and exercise regimen, including strength training. -Consider use of a weighted vest during walks to increase muscle  mass. -Continue metformin and Wellbutrin as prescribed.  Vitamin D Deficiency Vitamin D is at goal of 50.  Most recent vitamin D level was 53.3. She is on  prescription ergocalciferol 50,000 IU weekly. No N/V or muscle weakness with Ergocalciferol  Lab Results  Component Value Date   VD25OH 53.3 07/29/2022   VD25OH 25.6 (L) 12/14/2021   VD25OH 29.7 (L) 03/23/2021    Plan: Continue and  refill  prescription ergocalciferol 50,000 IU weekly Low vitamin D levels can be associated with adiposity and may result in leptin resistance and weight gain. Also associated with fatigue. Currently on vitamin D supplementation without any adverse effects.  Recheck vitamin D levels 3-4 times yearly to optimize supplementation/avoid over supplementation.    Other depression/emotional eating [name] has had issues with stress/emotional eating. Currently this is moderately controlled. Overall mood is stable. Medication(s): Other: Wellbutrin XL 150 mg daily and Trokendi XR 50 mg daily.  Patient reports fatigue possibly related to Trokendi (topiramate) and recent illness/ URI-possibly Covid.  Plan:  -Reduce/Refill Trokendi to 25mg  daily to assess if fatigue improves. -Consider increasing Wellbutrin at next visit if fatigue persists. -Encourage patient to continue diet and exercise regimen, including strength training. -Consider use of a weighted vest during walks to increase muscle mass. -Continue/Refill metformin and Wellbutrin as prescribed.     ATTESTASTION STATEMENTS:  Reviewed by clinician on day of visit: allergies, medications, problem list, medical history, surgical history, family history, social history, and previous encounter notes.   I have personally spent 40 minutes total time today in preparation, patient care, nutritional counseling and documentation for this visit, including the following: review of clinical lab tests; review of medical tests/procedures/services.      Kaniel Kiang, PA-C

## 2022-11-18 ENCOUNTER — Other Ambulatory Visit: Payer: Self-pay

## 2022-11-23 IMAGING — MG MM DIGITAL SCREENING BILAT W/ TOMO AND CAD
6 of 12 series · 6 of 36 positions shown · non-contrast
Comparison: None.

CLINICAL DATA: Screening.

EXAM:
DIGITAL SCREENING BILATERAL MAMMOGRAM WITH TOMOSYNTHESIS AND CAD
TECHNIQUE: Bilateral screening digital craniocaudal and mediolateral oblique
mammograms were obtained. Bilateral screening digital breast
tomosynthesis was performed. The images were evaluated with
computer-aided detection.

[R CC synth-2D]
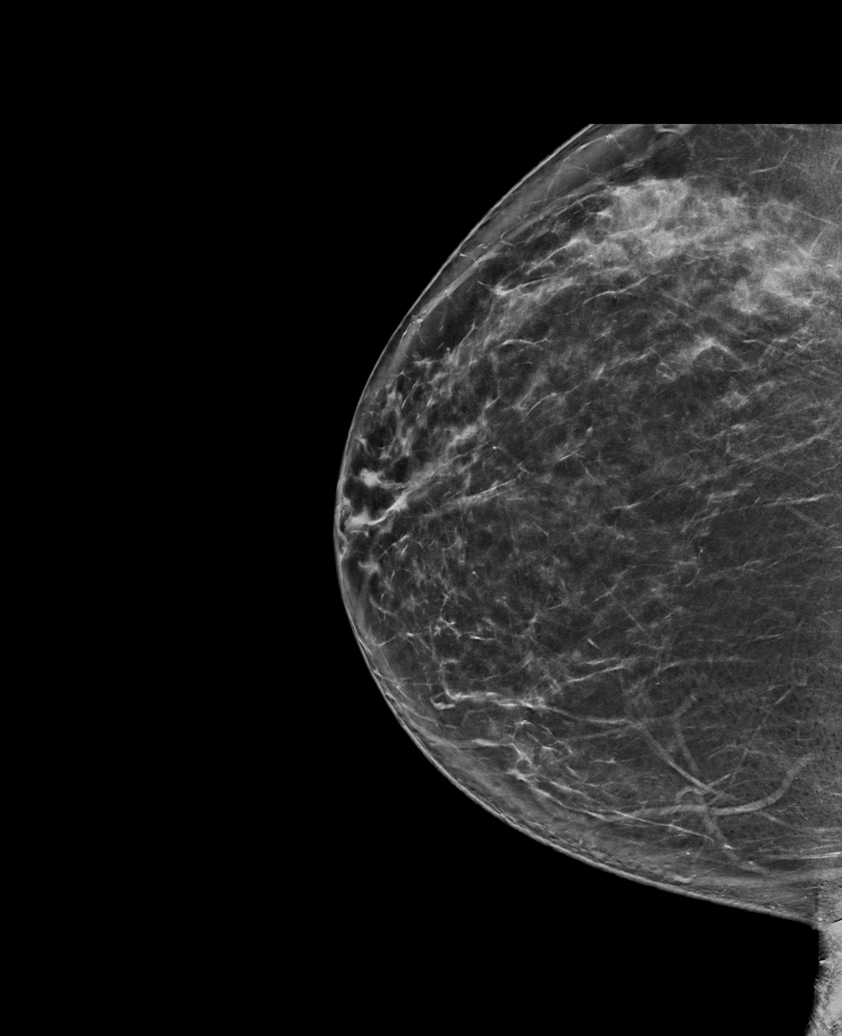

[L CC synth-2D (1 of 2)]
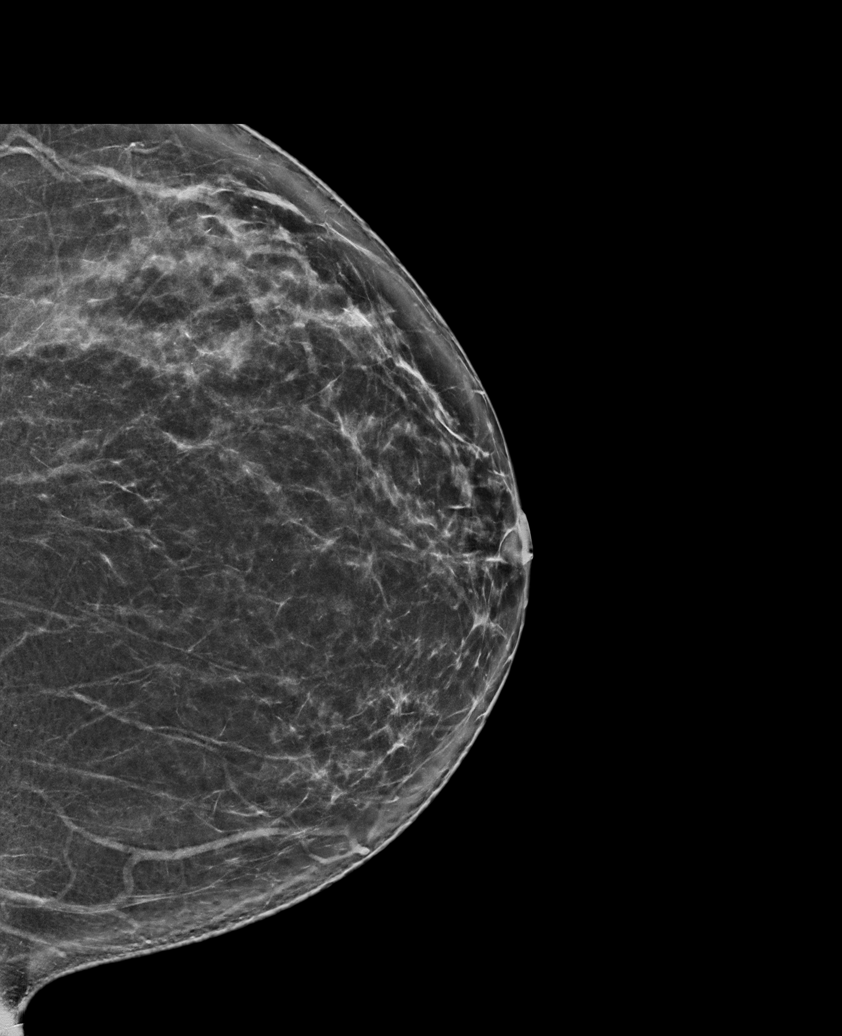

[L MLO synth-2D]
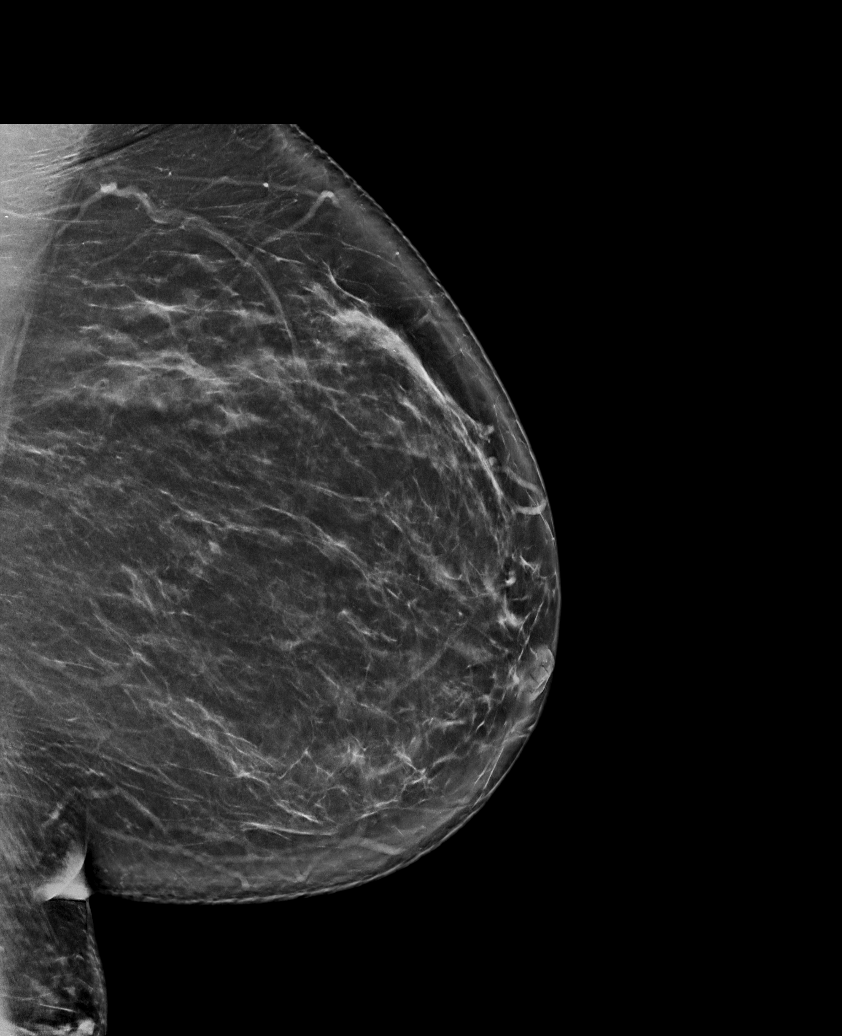

[R MLO synth-2D (1 of 2)]
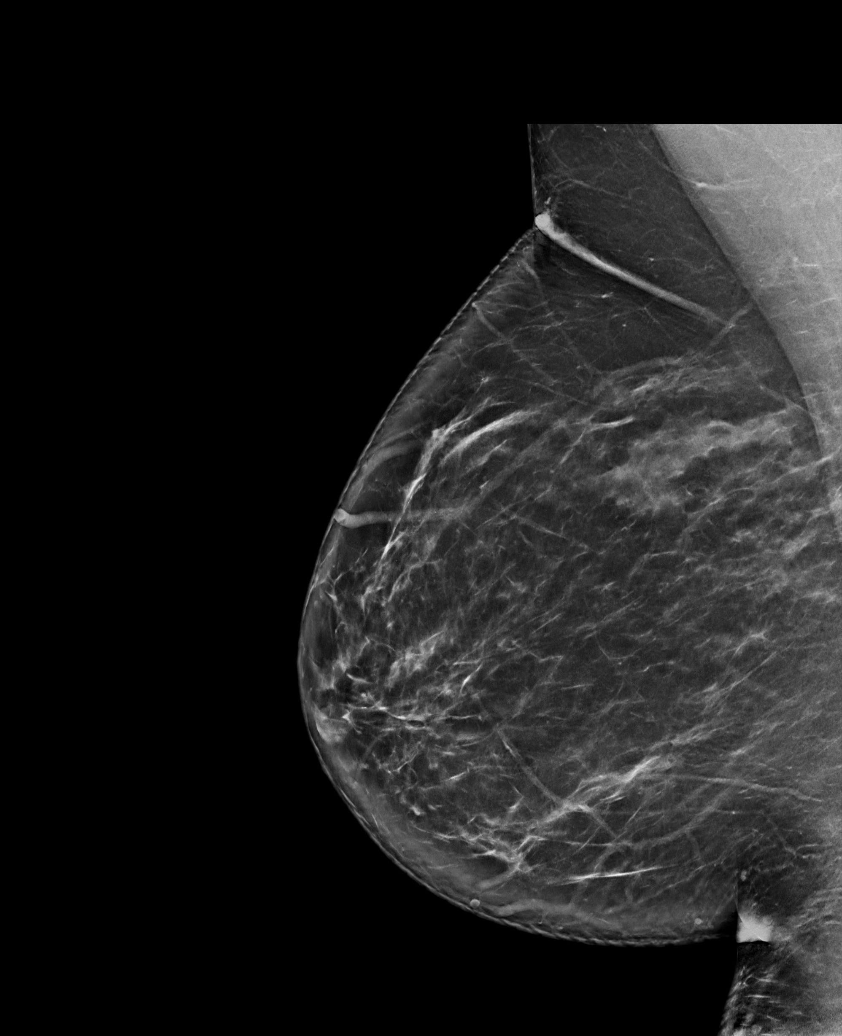

[L CC synth-2D (2 of 2)]
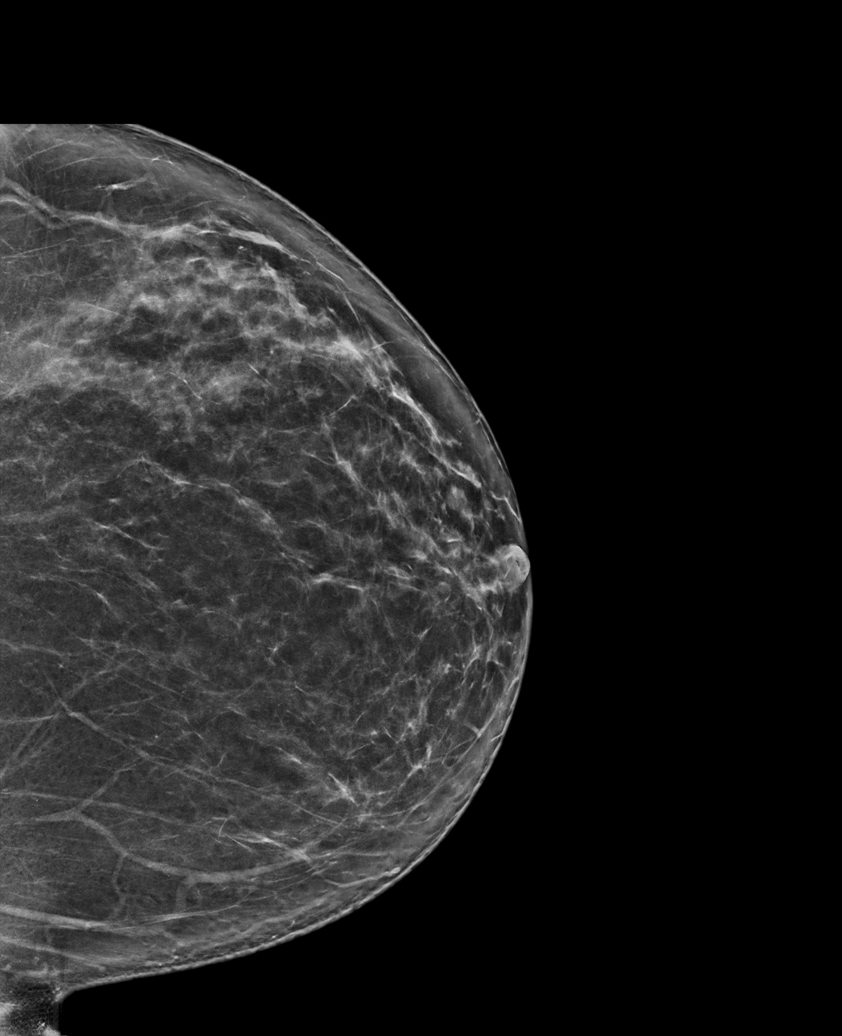

[R MLO synth-2D (2 of 2)]
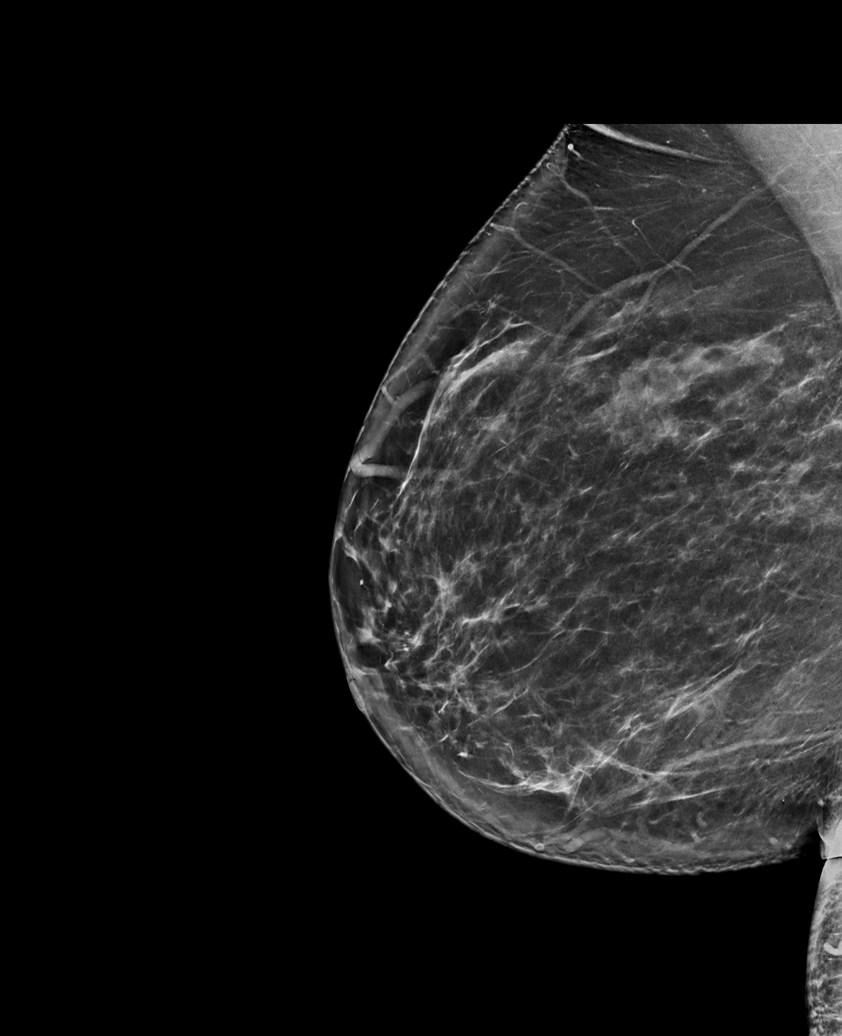

[6 of 36 positions shown; findings below may reference images not displayed]

ACR Breast Density Category c: The breast tissue is heterogeneously
dense, which may obscure small masses
FINDINGS: There are no findings suspicious for malignancy.
IMPRESSION: No mammographic evidence of malignancy. A result letter of this
screening mammogram will be mailed directly to the patient.

RECOMMENDATION:
Screening mammogram in one year. (Code:C8-T-HNK)

BI-RADS CATEGORY  1: Negative.

## 2022-12-09 ENCOUNTER — Encounter (INDEPENDENT_AMBULATORY_CARE_PROVIDER_SITE_OTHER): Payer: Self-pay | Admitting: Family Medicine

## 2022-12-09 ENCOUNTER — Ambulatory Visit (INDEPENDENT_AMBULATORY_CARE_PROVIDER_SITE_OTHER): Payer: 59 | Admitting: Family Medicine

## 2022-12-09 VITALS — BP 133/82 | HR 78 | Temp 98.5°F | Ht 65.0 in | Wt 265.0 lb

## 2022-12-09 DIAGNOSIS — E88819 Insulin resistance, unspecified: Secondary | ICD-10-CM | POA: Diagnosis not present

## 2022-12-09 DIAGNOSIS — E66813 Obesity, class 3: Secondary | ICD-10-CM

## 2022-12-09 DIAGNOSIS — R632 Polyphagia: Secondary | ICD-10-CM | POA: Diagnosis not present

## 2022-12-09 DIAGNOSIS — Z6841 Body Mass Index (BMI) 40.0 and over, adult: Secondary | ICD-10-CM

## 2022-12-09 DIAGNOSIS — E559 Vitamin D deficiency, unspecified: Secondary | ICD-10-CM | POA: Diagnosis not present

## 2022-12-09 DIAGNOSIS — F5089 Other specified eating disorder: Secondary | ICD-10-CM

## 2022-12-09 MED ORDER — METFORMIN HCL ER 500 MG PO TB24
500.0000 mg | ORAL_TABLET | Freq: Every day | ORAL | 0 refills | Status: DC
Start: 2022-12-09 — End: 2023-01-10

## 2022-12-09 MED ORDER — VITAMIN D (ERGOCALCIFEROL) 1.25 MG (50000 UNIT) PO CAPS
50000.0000 [IU] | ORAL_CAPSULE | ORAL | 0 refills | Status: DC
Start: 2022-12-09 — End: 2023-02-24

## 2022-12-09 MED ORDER — BUPROPION HCL ER (XL) 150 MG PO TB24
150.0000 mg | ORAL_TABLET | ORAL | 0 refills | Status: DC
Start: 2022-12-09 — End: 2022-12-23

## 2022-12-09 NOTE — Progress Notes (Signed)
[name], D.O.  ABFM, ABOM Specializing in Clinical Bariatric Medicine  Office located at: [address] [place], [place]  [address]   Assessment and Plan:   Medications Discontinued During This Encounter  Medication Reason   Topiramate ER (TROKENDI XR) 25 MG CP24    buPROPion (WELLBUTRIN XL) 150 MG 24 hr tablet Reorder   metFORMIN (GLUCOPHAGE-XR) 500 MG 24 hr tablet Reorder   Vitamin D, Ergocalciferol, (DRISDOL) 1.25 MG (50000 UNIT) CAPS capsule Reorder     Meds ordered this encounter  Medications   Vitamin D, Ergocalciferol, (DRISDOL) 1.25 MG (50000 UNIT) CAPS capsule    Sig: Take 1 capsule (50,000 Units total) by mouth every 7 (seven) days.    Dispense:  12 capsule    Refill:  0    E55.9   metFORMIN (GLUCOPHAGE-XR) 500 MG 24 hr tablet    Sig: Take 1 tablet (500 mg total) by mouth daily with food.    Dispense:  30 tablet    Refill:  0   buPROPion (WELLBUTRIN XL) 150 MG 24 hr tablet    Sig: Take 1 tablet (150 mg total) by mouth every morning.    Dispense:  90 tablet    Refill:  0    Next OV recheck fasting labs (cholesterol, CMP, CBC, fasting Insulin, B12, vit D, TSH, FT4, etc).   Polyphagia Assessment & Plan: Per OV note with Shawn on October 2nd: [name] "Reports significant fatigue, having trouble getting up in the mornings since starting the Trokendi XR 50 mg daily". As a result, Shawn decreased the Trokendi XR to 25 mg daily. Today, [name] reports still feeling "dopey and sleepy" although at a lower degree. Pt notes that the Trokendi helps to control cravings. Ultimately, our shared decision is to discontinue Trokendi XR due to adverse SE. Continue prescribed nutritional plan.     Insulin resistance Assessment & Plan: I.R treated with Metformin XR 500 mg daily. Every now and then, pt will have GI distress (loose stools) when eating "off -plan" foods or when experiencing an IBS flair. She desires to continue Metformin since side effects are manageable  and the med is really helping abate hunger and cravings. Pt will continue prescribed nutritional plan & weight loss efforts. Will refill Metformin today; no dose change.    Vitamin D deficiency Assessment & Plan: Last vitamin D of 53.3 on 07/29/22. Pt endorses taking ergocalciferol 50,000 units once a week. Continue with weight loss efforts. Will refill ERGO today; maintain at current dose.    Other Specified Feeding or Eating Disorder, Emotional and Binge Eating Behaviors Assessment & Plan: Mood is stable. Pt reports that emotional eating has not been problematic since last OV. Reports good compliance and tolerance with Wellbutrin XL 150 mg daily. Pt will continue to journal her intake and follow her prescribed meal plan. Reminded patient of the importance of following their prudent nutrition plan and how food can affect mood as well to support emotional wellbeing. Will refill Wellbutrin today; no dose change.    TREATMENT PLAN FOR OBESITY: Class 3 severe obesity due to excess calories with serious comorbidity and body mass index (BMI) of 45.0 to 49.9 in adult [facility]) Assessment & Plan: [name] is here to discuss her progress with her obesity treatment plan along with follow-up of her obesity related diagnoses. See Medical Weight Management Flowsheet for complete bioelectrical impedance results.  Since last office visit on 11/17/22 patient's muscle mass has increased by 3 lb. Fat mass  has decreased by 5.2 lb. Total body water has decreased by 4.8 lb.  Counseling done on how various foods will affect these numbers and how to maximize success  Total lbs lost to date: 35 lbs  Total weight loss percentage to date: 11.67%   No change to meal plan - see Subjective  Behavioral Intervention Additional resources provided today: food journaling log handout Evidence-based interventions for health behavior change were utilized today including the discussion of self monitoring techniques,  problem-solving barriers and SMART goal setting techniques.   Regarding patient's less desirable eating habits and patterns, we employed the technique of small changes.  Pt will specifically work on: n/a  FOLLOW UP: Return 12/23/22. She was informed of the importance of frequent follow up visits to maximize her success with intensive lifestyle modifications for her multiple health conditions.  Subjective:   Chief complaint: Obesity [name] is here to discuss her progress with her obesity treatment plan. She is on the Category 3 Plan and keeping a food journal and adhering to recommended goals of 1400-1500 calories and 110-120+ grams of  protein and states she is following her eating plan approximately 85% of the time. She states she is not exercising.   Interval History:  [name] is here for a follow up office visit. Since last OV, [name] is down 2 lbs. Reports adhering to journaling parameters roughly 85% of the time. Hunger and cravings are mostly well controlled. [name] reports still feeling "dopey and sleepy" at lower dose Trokendi (see Polyphagia note).   Pharmacotherapy for weight loss: She is currently taking  Wellbutrin XL 150 mg daily, Metformin XR 500 mg daily, & Trokendi XR 25 mg daily   for medical weight loss.  Denies side effects.    Review of Systems:  Pertinent positives were addressed with patient today.  Reviewed by clinician on day of visit: allergies, medications, problem list, medical history, surgical history, family history, social history, and previous encounter notes.  Weight Summary and Biometrics   Weight Lost Since Last Visit: 2lb  Weight Gained Since Last Visit: 0lb   Vitals Temp: 98.5 F (36.9 C) BP: 133/82 Pulse Rate: 78 SpO2: 100 %   Anthropometric Measurements Height: 5\' 5"  (1.651 m) Weight: 265 lb (120.2 kg) BMI (Calculated): 44.1 Weight at Last Visit: 267lb Weight Lost Since Last Visit: 2lb Weight Gained Since Last Visit:  0lb Starting Weight: 300lb Total Weight Loss (lbs): 31 lb (14.1 kg) Peak Weight: 318lb   Body Composition  Body Fat %: 50.5 % Fat Mass (lbs): 134 lbs Muscle Mass (lbs): 124.6 lbs Total Body Water (lbs): 93.8 lbs Visceral Fat Rating : 16   Other Clinical Data Fasting: no Labs: no Today's Visit #: 44 Starting Date: 03/26/20   Objective:   PHYSICAL EXAM: Blood pressure 133/82, pulse 78, temperature 98.5 F (36.9 C), height 5\' 5"  (1.651 m), weight 265 lb (120.2 kg), SpO2 100%. Body mass index is 44.1 kg/m.  General: Well Developed, well nourished, and in no acute distress.  HEENT: Normocephalic, atraumatic Skin: Warm and dry, cap RF less 2 sec, good turgor Chest:  Normal excursion, shape, no gross abn Respiratory: speaking in full sentences, no conversational dyspnea NeuroM-Sk: Ambulates w/o assistance, moves * 4 Psych: A and O *3, insight good, mood-full  DIAGNOSTIC DATA REVIEWED:  BMET    Component Value Date/Time   NA 141 07/29/2022 0853   NA 138 10/14/2011 0752   K 4.5 07/29/2022 0853   K 4.1 10/14/2011 0752   CL  102 07/29/2022 0853   CL 106 10/14/2011 0752   CO2 24 07/29/2022 0853   CO2 27 10/14/2011 0752   GLUCOSE 74 07/29/2022 0853   GLUCOSE 87 04/16/2016 1123   GLUCOSE 90 10/14/2011 0752   BUN 19 07/29/2022 0853   BUN 11 10/14/2011 0752   CREATININE 0.68 07/29/2022 0853   CREATININE 0.55 (L) 10/14/2011 0752   CALCIUM 9.3 07/29/2022 0853   CALCIUM 9.0 10/14/2011 0752   GFRNONAA >90 01/29/2014 1950   GFRNONAA >60 10/14/2011 0752   GFRAA >90 01/29/2014 1950   GFRAA >60 10/14/2011 0752   Lab Results  Component Value Date   HGBA1C 5.2 07/29/2022   HGBA1C 5.4 03/23/2021   Lab Results  Component Value Date   INSULIN 10.9 07/29/2022   INSULIN 22.2 03/26/2020   Lab Results  Component Value Date   TSH 1.810 07/08/2022   CBC    Component Value Date/Time   WBC 6.4 07/29/2022 0853   WBC 8.0 12/15/2015 0905   RBC 3.94 07/29/2022 0853   RBC  3.86 (L) 12/15/2015 0905   HGB 10.9 (L) 07/29/2022 0853   HCT 34.7 07/29/2022 0853   PLT 317 07/29/2022 0853   MCV 88 07/29/2022 0853   MCV 91 10/14/2011 0752   MCH 27.7 07/29/2022 0853   MCH 29.8 12/15/2015 0905   MCHC 31.4 (L) 07/29/2022 0853   MCHC 33.0 12/15/2015 0905   RDW 13.3 07/29/2022 0853   RDW 14.1 10/14/2011 0752   Iron Studies No results found for: "IRON", "TIBC", "FERRITIN", "IRONPCTSAT" Lipid Panel     Component Value Date/Time   CHOL 203 (H) 07/29/2022 0853   TRIG 94 07/29/2022 0853   HDL 58 07/29/2022 0853   LDLCALC 128 (H) 07/29/2022 0853   Hepatic Function Panel     Component Value Date/Time   PROT 7.2 07/29/2022 0853   PROT 7.4 10/14/2011 0752   ALBUMIN 4.6 07/29/2022 0853   ALBUMIN 3.8 10/14/2011 0752   AST 10 07/29/2022 0853   AST 8 (L) 10/14/2011 0752   ALT 5 07/29/2022 0853   ALT 13 10/14/2011 0752   ALKPHOS 51 07/29/2022 0853   ALKPHOS 48 (L) 10/14/2011 0752   BILITOT 0.3 07/29/2022 0853   BILITOT 0.4 10/14/2011 0752      Component Value Date/Time   TSH 1.810 07/08/2022 1532   Nutritional Lab Results  Component Value Date   VD25OH 53.3 07/29/2022   VD25OH 25.6 (L) 12/14/2021   VD25OH 29.7 (L) 03/23/2021    Attestations:   I, Special Puri, acting as a Stage manager for Thomasene Lot, DO., have compiled all relevant documentation for today's office visit on behalf of Thomasene Lot, DO, while in the presence of [employer], DO.  I have reviewed the above documentation for accuracy and completeness, and I agree with the above. [name], D.O.  The 21st Century Cures Act was signed into law in 2016 which includes the topic of electronic health records.  This provides immediate access to information in MyChart.  This includes consultation notes, operative notes, office notes, lab results and pathology reports.  If you have any questions about what you read please let us know at your next visit so we can discuss your concerns  and take corrective action if need be.  We are right here with you.

## 2022-12-23 ENCOUNTER — Ambulatory Visit (INDEPENDENT_AMBULATORY_CARE_PROVIDER_SITE_OTHER): Payer: 59 | Admitting: Family Medicine

## 2022-12-23 ENCOUNTER — Encounter (INDEPENDENT_AMBULATORY_CARE_PROVIDER_SITE_OTHER): Payer: Self-pay | Admitting: Family Medicine

## 2022-12-23 VITALS — BP 124/85 | HR 74 | Temp 98.0°F | Ht 65.0 in | Wt 269.0 lb

## 2022-12-23 DIAGNOSIS — R632 Polyphagia: Secondary | ICD-10-CM

## 2022-12-23 DIAGNOSIS — E7849 Other hyperlipidemia: Secondary | ICD-10-CM | POA: Diagnosis not present

## 2022-12-23 DIAGNOSIS — E88819 Insulin resistance, unspecified: Secondary | ICD-10-CM

## 2022-12-23 DIAGNOSIS — E538 Deficiency of other specified B group vitamins: Secondary | ICD-10-CM

## 2022-12-23 DIAGNOSIS — E559 Vitamin D deficiency, unspecified: Secondary | ICD-10-CM | POA: Diagnosis not present

## 2022-12-23 DIAGNOSIS — R6339 Other feeding difficulties: Secondary | ICD-10-CM

## 2022-12-23 DIAGNOSIS — Z6841 Body Mass Index (BMI) 40.0 and over, adult: Secondary | ICD-10-CM

## 2022-12-23 DIAGNOSIS — F5089 Other specified eating disorder: Secondary | ICD-10-CM

## 2022-12-23 MED ORDER — BUPROPION HCL ER (XL) 150 MG PO TB24
ORAL_TABLET | ORAL | Status: DC
Start: 2022-12-23 — End: 2023-01-10

## 2022-12-23 NOTE — Progress Notes (Signed)
[name], D.O.  ABFM, ABOM Specializing in Clinical Bariatric Medicine  Office located at: [address] [place], [place]  [address]   Assessment and Plan:   FOR THE DISEASE OF OBESITY:  Since last office visit on 12/09/22 patient's  [name] mass has decreased by 2 lb. Fat mass has increased by 6 lb. Total body water has increased by 6.4 lb.  Counseling done on how various foods will affect these numbers and how to maximize success  Total lbs lost to date: 31 lbs  Total weight loss percentage to date: 10.33%    Recommended Dietary Goals [name] is currently in the action stage of change. As such, her goal is to continue weight management plan.  She has agreed to: continue current plan   Behavioral Intervention We discussed the following Behavioral Modification Strategies today: continuing to journal intake   Additional resources provided today: Food journaling log handout   Evidence-based interventions for health behavior change were utilized today including the discussion of self monitoring techniques, problem-solving barriers and SMART goal setting techniques.   Regarding patient's less desirable eating habits and patterns, we employed the technique of small changes.   Pt will specifically work on: increasing water intake to 100 ounces daily and increasing walking to 30 minutes, 5 days a week for next visit.    Recommended Physical Activity Goals [name] has been advised to work up to 150 minutes of moderate intensity aerobic activity a week and strengthening exercises 2-3 times per week for cardiovascular health, weight loss maintenance and preservation of [name] mass.   She has agreed to :  continue to gradually increase the amount and intensity of exercise    Pharmacotherapy We discussed various medication options to help [name] with her weight loss efforts and we both agreed to : continue Metformin XR 500 mg daily & increase Wellbutrin XL 150 mg to  bid.    FOR ASSOCIATED CONDITIONS ADDRESSED TODAY:  Other hyperlipidemia Assessment & Plan: No currents meds. Diet/exercise approach. Last lipid panel obtained on 07/29/22 showed elevated LDL of 128.   Continue with heart healthy, low cholesterol meal plan. Will obtain labs today.   Orders: -  Comprehensive metabolic panel -  Lipid Panel With LDL/HDL Ratio   Insulin resistance Assessment & Plan: Condition treated with Metformin XR 500 mg daily; pt tolerates medication pretty well - notes occasional GI upset when eating "off-plan" foods.  Maintain with Metformin at current dose; pt denies need for refill. Continue with reduced calorie nutritional plan. Will obtain labs today.   Orders:  - Insulin, random - TSH - T4, free   Vitamin D deficiency Assessment & Plan: Most recent vit D of 53.3 on 07/29/22. No issues with ERGO 50,000 units once a week, compliance and tolerance good.   Continue with weight loss efforts and ERGO at current dose. Will recheck levels today.   Orders: - VITAMIN D 25 Hydroxy (Vit-D Deficiency, Fractures)   Other Specified Feeding or Eating Disorder, Emotional and Binge Eating Behaviors Assessment & Plan: Since we discontinued Trokendi XR due to adverse side effects LOV, pt admits to having increased hunger and cravings. She had a difficult time adhering to calorie goals and was over on several occasions. Additionally, she is on Wellbutrin XL 150 mg daily; she tolerates this well.   Shared decision is to increase Wellbutrin XL 150 mg to bid. Continue with prescribed meal plan.  Orders -  buPROPion HCl ER 150 mg 24 hr tablet (XL); 2  po q am   Polyphagia Assessment & Plan: See eating disorder note.   Orders: - TSH - T4, free -  buPROPion HCl ER 150 mg 24 hr tablet (XL); 2 po q am   B12 deficiency Assessment & Plan: Most recent B12 of 720 on 07/29/2022. She has been compliant and tolerant with OTC B12 500 mcg daily.   Continue with  supplementation regiment and focus on B12 rich foods. Will recheck labs today.   Orders: -   CBC with Differential/Platelet -   Vitamin B12   BMI 40.0-44.9, adult (HCC) Current BMI 44.5 Morbid obesity (HCC)-START BMI 51.49/DATE 03/26/20 Assessment & Plan: See obesity treatment plan.   FOLLOW UP: Return 01/10/23. She was informed of the importance of frequent follow up visits to maximize her success with intensive lifestyle modifications for her multiple health conditions.  [name] is aware that we will review all of her lab results at our next visit.  She is aware that if anything is critical/ life threatening with the results, we will be contacting her via MyChart prior to the office visit to discuss management.    Subjective:   Chief complaint: Obesity [name] is here to discuss her progress with her obesity treatment plan. She is on the Category 3 Plan and keeping a food journal and adhering to recommended goals of 1400-1500 calories and 110-120+ grams of protein  and states she is following her eating plan approximately 75% of the time. She states she is doing squats (x25) daily and walking 30 minutes, 3 days a week.   Interval History:  [name] is here for a follow up office visit. Since last OV, [name] is up 4 lbs. Since we discontinued Trokendi XR due to adverse side effects LOV, pt admits to having increased hunger and cravings. She had a difficult time adhering to calorie goals and was over on several occasions.She is regularly hitting her protein goals.  Barriers identified: strong hunger signals and impaired satiety / inhibitory control and difficulty adhering to journaling parameters  Pharmacotherapy for weight loss: She is currently taking  Wellbutrin XL 150 mg daily & Metformin XR 500 mg daily . Her insurance does not pay for injectables. Was on Phentermine in the past, which gave her headaches and elevated her blood pressure.   Review of Systems:   Pertinent positives were addressed with patient today.  Reviewed by clinician on day of visit: allergies, medications, problem list, medical history, surgical history, family history, social history, and previous encounter notes.  Weight Summary and Biometrics   Weight Lost Since Last Visit: 4lb  Weight Gained Since Last Visit: 0lb    Vitals Temp: 98 F (36.7 C) BP: 124/85 Pulse Rate: 74 SpO2: 98 %   Anthropometric Measurements Height: 5\' 5"  (1.651 m) Weight: 269 lb (122 kg) BMI (Calculated): 44.76 Weight at Last Visit: 265lb Weight Lost Since Last Visit: 4lb Weight Gained Since Last Visit: 0lb Starting Weight: 300lb Total Weight Loss (lbs): 27 lb (12.2 kg) Peak Weight: 318lb   Body Composition  Body Fat %: 52 % Fat Mass (lbs): 140 lbs [name] Mass (lbs): 122.6 lbs Total Body Water (lbs): 100.2 lbs Visceral Fat Rating : 16   Other Clinical Data Fasting: yes Labs: yes Today's Visit #: 45 Starting Date: 03/26/20   Objective:   PHYSICAL EXAM: Blood pressure 124/85, pulse 74, temperature 98 F (36.7 C), height 5\' 5"  (1.651 m), weight 269 lb (122 kg), SpO2 98%. Body mass index is 44.76 kg/m.  General: she is overweight, cooperative and in no acute distress. PSYCH: Has normal mood, affect and thought process.   HEENT: EOMI, sclerae are anicteric. Lungs: Normal breathing effort, no conversational dyspnea. Extremities: Moves * 4 Neurologic: A and O * 3, good insight  DIAGNOSTIC DATA REVIEWED: BMET    Component Value Date/Time   NA 141 07/29/2022 0853   NA 138 10/14/2011 0752   K 4.5 07/29/2022 0853   K 4.1 10/14/2011 0752   CL 102 07/29/2022 0853   CL 106 10/14/2011 0752   CO2 24 07/29/2022 0853   CO2 27 10/14/2011 0752   GLUCOSE 74 07/29/2022 0853   GLUCOSE 87 04/16/2016 1123   GLUCOSE 90 10/14/2011 0752   BUN 19 07/29/2022 0853   BUN 11 10/14/2011 0752   CREATININE 0.68 07/29/2022 0853   CREATININE 0.55 (L) 10/14/2011 0752   CALCIUM 9.3  07/29/2022 0853   CALCIUM 9.0 10/14/2011 0752   GFRNONAA >90 01/29/2014 1950   GFRNONAA >60 10/14/2011 0752   GFRAA >90 01/29/2014 1950   GFRAA >60 10/14/2011 0752   Lab Results  Component Value Date   HGBA1C 5.2 07/29/2022   HGBA1C 5.4 03/23/2021   Lab Results  Component Value Date   INSULIN 10.9 07/29/2022   INSULIN 22.2 03/26/2020   Lab Results  Component Value Date   TSH 1.810 07/08/2022   CBC    Component Value Date/Time   WBC 6.4 07/29/2022 0853   WBC 8.0 12/15/2015 0905   RBC 3.94 07/29/2022 0853   RBC 3.86 (L) 12/15/2015 0905   HGB 10.9 (L) 07/29/2022 0853   HCT 34.7 07/29/2022 0853   PLT 317 07/29/2022 0853   MCV 88 07/29/2022 0853   MCV 91 10/14/2011 0752   MCH 27.7 07/29/2022 0853   MCH 29.8 12/15/2015 0905   MCHC 31.4 (L) 07/29/2022 0853   MCHC 33.0 12/15/2015 0905   RDW 13.3 07/29/2022 0853   RDW 14.1 10/14/2011 0752   Iron Studies No results found for: "IRON", "TIBC", "FERRITIN", "IRONPCTSAT" Lipid Panel     Component Value Date/Time   CHOL 203 (H) 07/29/2022 0853   TRIG 94 07/29/2022 0853   HDL 58 07/29/2022 0853   LDLCALC 128 (H) 07/29/2022 0853   Hepatic Function Panel     Component Value Date/Time   PROT 7.2 07/29/2022 0853   PROT 7.4 10/14/2011 0752   ALBUMIN 4.6 07/29/2022 0853   ALBUMIN 3.8 10/14/2011 0752   AST 10 07/29/2022 0853   AST 8 (L) 10/14/2011 0752   ALT 5 07/29/2022 0853   ALT 13 10/14/2011 0752   ALKPHOS 51 07/29/2022 0853   ALKPHOS 48 (L) 10/14/2011 0752   BILITOT 0.3 07/29/2022 0853   BILITOT 0.4 10/14/2011 0752      Component Value Date/Time   TSH 1.810 07/08/2022 1532   Nutritional Lab Results  Component Value Date   VD25OH 53.3 07/29/2022   VD25OH 25.6 (L) 12/14/2021   VD25OH 29.7 (L) 03/23/2021    Attestations:   I, Special Puri, acting as a Stage manager for Thomasene Lot, DO., have compiled all relevant documentation for today's office visit on behalf of Thomasene Lot, DO, while in the  presence of [employer], DO.  I have reviewed the above documentation for accuracy and completeness, and I agree with the above. [name], D.O.  The 21st Century Cures Act was signed into law in 2016 which includes the topic of electronic health records.  This provides immediate access to information in MyChart.  This includes consultation notes, operative notes, office notes, lab results and pathology reports.  If you have any questions about what you read please let us know at your next visit so we can discuss your concerns and take corrective action if need be.  We are right here with you.

## 2022-12-24 LAB — CBC WITH DIFFERENTIAL/PLATELET
Basophils Absolute: 0 10*3/uL (ref 0.0–0.2)
Basos: 1 %
EOS (ABSOLUTE): 0.2 10*3/uL (ref 0.0–0.4)
Eos: 4 %
Hematocrit: 35.5 % (ref 34.0–46.6)
Hemoglobin: 10.9 g/dL — ABNORMAL LOW (ref 11.1–15.9)
Immature Grans (Abs): 0 10*3/uL (ref 0.0–0.1)
Immature Granulocytes: 1 %
Lymphocytes Absolute: 1.6 10*3/uL (ref 0.7–3.1)
Lymphs: 29 %
MCH: 28.3 pg (ref 26.6–33.0)
MCHC: 30.7 g/dL — ABNORMAL LOW (ref 31.5–35.7)
MCV: 92 fL (ref 79–97)
Monocytes Absolute: 0.4 10*3/uL (ref 0.1–0.9)
Monocytes: 8 %
Neutrophils Absolute: 3.2 10*3/uL (ref 1.4–7.0)
Neutrophils: 57 %
Platelets: 305 10*3/uL (ref 150–450)
RBC: 3.85 x10E6/uL (ref 3.77–5.28)
RDW: 14.1 % (ref 11.7–15.4)
WBC: 5.5 10*3/uL (ref 3.4–10.8)

## 2022-12-24 LAB — TSH: TSH: 2.33 u[IU]/mL (ref 0.450–4.500)

## 2022-12-24 LAB — COMPREHENSIVE METABOLIC PANEL
ALT: 14 [IU]/L (ref 0–32)
AST: 16 [IU]/L (ref 0–40)
Albumin: 4.3 g/dL (ref 3.9–4.9)
Alkaline Phosphatase: 54 [IU]/L (ref 44–121)
BUN/Creatinine Ratio: 19 (ref 9–23)
BUN: 15 mg/dL (ref 6–24)
Bilirubin Total: 0.3 mg/dL (ref 0.0–1.2)
CO2: 25 mmol/L (ref 20–29)
Calcium: 9.3 mg/dL (ref 8.7–10.2)
Chloride: 102 mmol/L (ref 96–106)
Creatinine, Ser: 0.79 mg/dL (ref 0.57–1.00)
Globulin, Total: 2.4 g/dL (ref 1.5–4.5)
Glucose: 84 mg/dL (ref 70–99)
Potassium: 4.5 mmol/L (ref 3.5–5.2)
Sodium: 139 mmol/L (ref 134–144)
Total Protein: 6.7 g/dL (ref 6.0–8.5)
eGFR: 95 mL/min/{1.73_m2} (ref >59)

## 2022-12-24 LAB — LIPID PANEL WITH LDL/HDL RATIO
Cholesterol, Total: 218 mg/dL — ABNORMAL HIGH (ref 100–199)
HDL: 55 mg/dL (ref >39)
LDL Chol Calc (NIH): 144 mg/dL — ABNORMAL HIGH (ref 0–99)
LDL/HDL Ratio: 2.6 ratio (ref 0.0–3.2)
Triglycerides: 104 mg/dL (ref 0–149)
VLDL Cholesterol Cal: 19 mg/dL (ref 5–40)

## 2022-12-24 LAB — VITAMIN B12: Vitamin B-12: 492 pg/mL (ref 232–1245)

## 2022-12-24 LAB — VITAMIN D 25 HYDROXY (VIT D DEFICIENCY, FRACTURES): Vit D, 25-Hydroxy: 56.2 ng/mL (ref 30.0–100.0)

## 2022-12-24 LAB — INSULIN, RANDOM: INSULIN: 13.2 u[IU]/mL (ref 2.6–24.9)

## 2022-12-24 LAB — T4, FREE: Free T4: 1.32 ng/dL (ref 0.82–1.77)

## 2023-01-10 ENCOUNTER — Ambulatory Visit (INDEPENDENT_AMBULATORY_CARE_PROVIDER_SITE_OTHER): Payer: 59 | Admitting: Family Medicine

## 2023-01-10 ENCOUNTER — Encounter (INDEPENDENT_AMBULATORY_CARE_PROVIDER_SITE_OTHER): Payer: Self-pay | Admitting: Family Medicine

## 2023-01-10 ENCOUNTER — Other Ambulatory Visit: Payer: Self-pay

## 2023-01-10 VITALS — BP 120/63 | HR 80 | Temp 98.6°F | Ht 65.0 in | Wt 268.0 lb

## 2023-01-10 DIAGNOSIS — E7849 Other hyperlipidemia: Secondary | ICD-10-CM | POA: Diagnosis not present

## 2023-01-10 DIAGNOSIS — R632 Polyphagia: Secondary | ICD-10-CM

## 2023-01-10 DIAGNOSIS — Z6841 Body Mass Index (BMI) 40.0 and over, adult: Secondary | ICD-10-CM

## 2023-01-10 DIAGNOSIS — E88819 Insulin resistance, unspecified: Secondary | ICD-10-CM | POA: Diagnosis not present

## 2023-01-10 DIAGNOSIS — E538 Deficiency of other specified B group vitamins: Secondary | ICD-10-CM

## 2023-01-10 DIAGNOSIS — E559 Vitamin D deficiency, unspecified: Secondary | ICD-10-CM

## 2023-01-10 DIAGNOSIS — F5089 Other specified eating disorder: Secondary | ICD-10-CM

## 2023-01-10 MED ORDER — CYANOCOBALAMIN 500 MCG PO TABS
ORAL_TABLET | ORAL | Status: AC
Start: 2023-01-10 — End: ?

## 2023-01-10 MED ORDER — METFORMIN HCL ER 500 MG PO TB24
1000.0000 mg | ORAL_TABLET | Freq: Every day | ORAL | 0 refills | Status: DC
Start: 2023-01-10 — End: 2023-02-24
  Filled 2023-01-10: qty 60, 30d supply, fill #0

## 2023-01-10 MED ORDER — BUPROPION HCL ER (XL) 300 MG PO TB24
300.0000 mg | ORAL_TABLET | Freq: Every morning | ORAL | 0 refills | Status: DC
Start: 2023-01-10 — End: 2023-02-24
  Filled 2023-01-10: qty 30, 30d supply, fill #0

## 2023-01-10 NOTE — Progress Notes (Signed)
[name], D.O.  ABFM, ABOM Specializing in Clinical Bariatric Medicine  Office located at: [address] [place], [place]  [address]   Assessment and Plan:   FOR THE DISEASE OF OBESITY: BMI 40.0-44.9, adult (HCC) Current BMI 44.6 Morbid obesity (HCC)-START BMI 51.49/DATE 03/26/20 Since last office visit on 12/23/2022 patient's  Muscle mass has decreased by 2.2lb. Fat mass has increased by 2lb. Total body water has decreased by 3.4lb.  Counseling done on how various foods will affect these numbers and how to maximize success  Total lbs lost to date: 32 Total weight loss percentage to date: 10.67%   Recommended Dietary Goals [name] is currently in the action stage of change. As such, her goal is to continue weight management plan.  She has agreed to: continue current plan   Behavioral Intervention We discussed the following today: increasing lean protein intake to established goals, decreasing simple carbohydrates , planning for success, and continue to work on maintaining a reduced calorie state, getting the recommended amount of protein, incorporating whole foods, making healthy choices, staying well hydrated and practicing mindfulness when eating.  Additional resources provided today: Handout on traveling and holiday eating strategies, handout on protein equivalents of 2 ounces of meat or seafood, handout on protein content of various foods, and thanksgiving strategies and food log  Evidence-based interventions for health behavior change were utilized today including the discussion of self monitoring techniques, problem-solving barriers and SMART goal setting techniques.   Regarding patient's less desirable eating habits and patterns, we employed the technique of small changes.   Pt will specifically work on: Increase and get the right amount of protein in and increase protein for next visit.   - I recommended [employer].   - Consume 12oz of lean protein between  [employer].   Recommended Physical Activity Goals [name] has been advised to work up to 150 minutes of moderate intensity aerobic activity a week and strengthening exercises 2-3 times per week for cardiovascular health, weight loss maintenance and preservation of muscle mass.   She has agreed to :  Continue current level of physical activity  and continue to gradually increase the amount and intensity of exercise    Pharmacotherapy We discussed various medication options to help [name] with her weight loss efforts and we both agreed to : continue with nutritional and behavioral strategies and continue current anti-obesity medication regimen   FOR ASSOCIATED CONDITIONS ADDRESSED TODAY: B12 deficiency Assessment & Plan: Condition is essentially at goal.  Labs were reviewed.  Pt vitamin B-12 level has dropped from 720 in 07/29/2022 to 492 as of 12/23/2022. She is currently taking an OTC oral B-12 supplement without difficulty.  Lab Results  Component Value Date   VITAMINB12 492 12/23/2022   Plan: - Continue with OTC oral B-12 daily. Continue to increase her intake on B-12/protein rich foods such as lean red meats, dark leafy greens, or greek yogurt B12 deficiency -     Cyanocobalamin; 300-5107mcg once daily.    Vitamin D deficiency Assessment & Plan: Condition is Controlled. Labs were reviewed. Pt vitamin D level continues to improve. Her vitamin D level has increased from 53.3 to 56.2 as of 12/23/2022. This is being well controlled with ERGO once a week. Lab Results  Component Value Date   VD25OH 56.2 12/23/2022   VD25OH 53.3 07/29/2022   VD25OH 25.6 (L) 12/14/2021   Plan: - Continue to take prescription Vitamin D @50 ,000 IU every week as prescribed. She denies need  for refill.  Follow-up for routine testing of Vitamin D, at least 2-3 times per year to avoid over-replacement.   Other hyperlipidemia Assessment & Plan: Condition is Not optimized.. Labs were  reviewed. This is diet/exercise controlled. Pt LDL level increased from 128 to 144 within 5 months. Pt endorses some non lean meats occasionally. Pt informed me that she eats steak once a week but has overall decreased her beef intake.  Lab Results  Component Value Date   CHOL 218 (H) 12/23/2022   HDL 55 12/23/2022   LDLCALC 144 (H) 12/23/2022   TRIG 104 12/23/2022   Plan: - Extensive discussion was had with patient regarding how the foods that they eat affect their labs. Cardiovascular risk and specific lipid/LDL goals reviewed. Only eat red meats no more than twice a week. We recommend: aerobic activity with eventual goal of a minimum of 150+ min wk plus 2 days/ week of resistance or strength training. We will continue routine screening as patient continues to achieve health goals along their weight loss journey    Insulin resistance Assessment & Plan: Not at goal. Labs were reviewed. Goal is HgbA1c < 5.7, fasting insulin closer to 5.  Medication: Metformin XR.  She continues this without any GI upset or N/V/D. Pt insulin level unfortunately increased from 10.9 to 13.2 as of 12/23/2022. She endorses her hunger and cravings are well controlled. Pt hemoglobin is mildly low and pt admits to not taking her oral iron supplement consistently. Her CMP and thyroid levels are within normal range.  Lab Results  Component Value Date   HGBA1C 5.2 07/29/2022   Lab Results  Component Value Date   INSULIN 13.2 12/23/2022   INSULIN 10.9 07/29/2022   INSULIN 18.7 12/14/2021   INSULIN 16.6 03/23/2021   INSULIN 22.2 03/26/2020   Lab Results  Component Value Date   CREATININE 0.79 12/23/2022   BUN 15 12/23/2022   NA 139 12/23/2022   K 4.5 12/23/2022   CL 102 12/23/2022   CO2 25 12/23/2022      Component Value Date/Time   PROT 6.7 12/23/2022 0844   PROT 7.4 10/14/2011 0752   ALBUMIN 4.3 12/23/2022 0844   ALBUMIN 3.8 10/14/2011 0752   AST 16 12/23/2022 0844   AST 8 (L) 10/14/2011 0752   ALT 14  12/23/2022 0844   ALT 13 10/14/2011 0752   ALKPHOS 54 12/23/2022 0844   ALKPHOS 48 (L) 10/14/2011 0752   BILITOT 0.3 12/23/2022 0844   BILITOT 0.4 10/14/2011 0752    Lab Results  Component Value Date   WBC 5.5 12/23/2022   HGB 10.9 (L) 12/23/2022   HCT 35.5 12/23/2022   MCV 92 12/23/2022   PLT 305 12/23/2022   Lab Results  Component Value Date   TSH 2.330 12/23/2022   T3TOTAL 108 03/26/2020   Component Ref Range & Units 2 wk ago 1 yr ago 2 yr ago  Free T4 0.82 - 1.77 ng/dL 1.61 0.96 0.45   Plan:  Since her insulin level has increased and she still has some cravings we will increase her Metformin from 500mg  to 1,000mg . She will continue to focus on protein-rich, low simple carbohydrate foods. We reviewed the importance of hydration, regular exercise for stress reduction, and restorative sleep.   - Unless pre-existing renal or cardiopulmonary conditions exist which patient was told to limit their fluid intake by another provider, I recommended roughly one half of their weight in pounds, to be the approximate ounces of non-caloric, non-caffeinated beverages they  should drink per day; including 1 bottle more if they are engaging in minimum of exercise. Increasing water intake and physical activity will help alleviate constipation symptoms.  Insulin resistance -     metFORMIN HCl ER; Take 2 tablets (1,000 mg total) by mouth daily.  Dispense: 60 tablet; Refill: 0   Other Specified Feeding or Eating Disorder, Emotional and Binge Eating Behaviors Assessment & Plan: Since switching to Wellbutrin she states that she a decrease in hunger and food noise. She notes her past side effects are completely gone and she tolerates her increase in Wellbutrin well. She is able to meet her calorie goals and is even under sometimes now.   Plan: - Continue with Wellbutrin XL at current dose.  Other Specified Feeding or Eating Disorder, Emotional and Binge Eating Behaviors -     buPROPion HCl ER  (XL); 1 po q am  Dispense: 90 tablet; Refill: 0   Polyphagia Assessment & Plan: See eating disorder note above. ^ Polyphagia -     buPROPion HCl ER (XL); 1 po q am  Dispense: 90 tablet; Refill: 0  Follow up:   Return in about 29 days (around 02/08/2023). She was informed of the importance of frequent follow up visits to maximize her success with intensive lifestyle modifications for her multiple health conditions.  Subjective:   Chief complaint: Obesity [name] is here to discuss her progress with her obesity treatment plan. She is on the the Category 3 Plan and keeping a food journal and adhering to recommended goals of 1400-1500 calories and 110-120+ protein and states she is following her eating plan approximately 90% of the time. She states she is walking 20-30 minutes 3-4 days per week.  Interval History:  [name] is here for a follow up office visit. Since last OV,  she has been well. She states that her hunger has been under better control. She continues to journal her calorie intake and gets full after eating all of her foods on plan. She notes during days where she is full and under calories she drinks a protein shake. She will eat low carb bagels with sausage and a protein shake if she does not eat breakfast. She hit her calorie and protein goals 5/13 days. Pt informed me that she now drinks a zero sugar [employer] coffee creamer.    Barriers identified: none and difficulty maintaining a reduced calorie state.   Pharmacotherapy for weight loss: She is currently taking Metformin (off label use for incretin effect and / or insulin resistance and / or diabetes prevention) with adequate clinical response  and without side effects..   Review of Systems:  Pertinent positives were addressed with patient today.  Reviewed by clinician on day of visit: allergies, medications, problem list, medical history, surgical history, family history, social history, and previous encounter  notes.  Weight Summary and Biometrics   Weight Lost Since Last Visit: 1lb  Weight Gained Since Last Visit: 0lb    Vitals Temp: 98.6 F (37 C) BP: 120/63 Pulse Rate: 80 SpO2: 100 %   Anthropometric Measurements Height: 5\' 5"  (1.651 m) Weight: 268 lb (121.6 kg) BMI (Calculated): 44.6 Weight at Last Visit: 269lb Weight Lost Since Last Visit: 1lb Weight Gained Since Last Visit: 0lb Starting Weight: 300lb Total Weight Loss (lbs): 32 lb (14.5 kg) Peak Weight: 318lb   Body Composition  Body Fat %: 52.8 % Fat Mass (lbs): 142 lbs Muscle Mass (lbs): 120.4 lbs Total Body Water (lbs): 96.8 lbs Visceral  Fat Rating : 17   Other Clinical Data Fasting: no Labs: no Today's Visit #: 46 Starting Date: 03/26/20    Objective:   PHYSICAL EXAM: Blood pressure 120/63, pulse 80, temperature 98.6 F (37 C), height 5\' 5"  (1.651 m), weight 268 lb (121.6 kg), SpO2 100%. Body mass index is 44.6 kg/m.  General: she is overweight, cooperative and in no acute distress. PSYCH: Has normal mood, affect and thought process.   HEENT: EOMI, sclerae are anicteric. Lungs: Normal breathing effort, no conversational dyspnea. Extremities: Moves * 4 Neurologic: A and O * 3, good insight  DIAGNOSTIC DATA REVIEWED: BMET    Component Value Date/Time   NA 139 12/23/2022 0844   NA 138 10/14/2011 0752   K 4.5 12/23/2022 0844   K 4.1 10/14/2011 0752   CL 102 12/23/2022 0844   CL 106 10/14/2011 0752   CO2 25 12/23/2022 0844   CO2 27 10/14/2011 0752   GLUCOSE 84 12/23/2022 0844   GLUCOSE 87 04/16/2016 1123   GLUCOSE 90 10/14/2011 0752   BUN 15 12/23/2022 0844   BUN 11 10/14/2011 0752   CREATININE 0.79 12/23/2022 0844   CREATININE 0.55 (L) 10/14/2011 0752   CALCIUM 9.3 12/23/2022 0844   CALCIUM 9.0 10/14/2011 0752   GFRNONAA >90 01/29/2014 1950   GFRNONAA >60 10/14/2011 0752   GFRAA >90 01/29/2014 1950   GFRAA >60 10/14/2011 0752   Lab Results  Component Value Date   HGBA1C 5.2  07/29/2022   HGBA1C 5.4 03/23/2021   Lab Results  Component Value Date   INSULIN 13.2 12/23/2022   INSULIN 22.2 03/26/2020   Lab Results  Component Value Date   TSH 2.330 12/23/2022   CBC    Component Value Date/Time   WBC 5.5 12/23/2022 0844   WBC 8.0 12/15/2015 0905   RBC 3.85 12/23/2022 0844   RBC 3.86 (L) 12/15/2015 0905   HGB 10.9 (L) 12/23/2022 0844   HCT 35.5 12/23/2022 0844   PLT 305 12/23/2022 0844   MCV 92 12/23/2022 0844   MCV 91 10/14/2011 0752   MCH 28.3 12/23/2022 0844   MCH 29.8 12/15/2015 0905   MCHC 30.7 (L) 12/23/2022 0844   MCHC 33.0 12/15/2015 0905   RDW 14.1 12/23/2022 0844   RDW 14.1 10/14/2011 0752   Iron Studies No results found for: "IRON", "TIBC", "FERRITIN", "IRONPCTSAT" Lipid Panel     Component Value Date/Time   CHOL 218 (H) 12/23/2022 0844   TRIG 104 12/23/2022 0844   HDL 55 12/23/2022 0844   LDLCALC 144 (H) 12/23/2022 0844   Hepatic Function Panel     Component Value Date/Time   PROT 6.7 12/23/2022 0844   PROT 7.4 10/14/2011 0752   ALBUMIN 4.3 12/23/2022 0844   ALBUMIN 3.8 10/14/2011 0752   AST 16 12/23/2022 0844   AST 8 (L) 10/14/2011 0752   ALT 14 12/23/2022 0844   ALT 13 10/14/2011 0752   ALKPHOS 54 12/23/2022 0844   ALKPHOS 48 (L) 10/14/2011 0752   BILITOT 0.3 12/23/2022 0844   BILITOT 0.4 10/14/2011 0752      Component Value Date/Time   TSH 2.330 12/23/2022 0844   Nutritional Lab Results  Component Value Date   VD25OH 56.2 12/23/2022   VD25OH 53.3 07/29/2022   VD25OH 25.6 (L) 12/14/2021    Attestations:   I, Clinical biochemist, acting as a Stage manager for [employer], DO., have compiled all relevant documentation for today's office visit on behalf of Thomasene Lot, DO, while in the presence  of [employer], DO.  Reviewed by clinician on day of visit: allergies, medications, problem list, medical history, surgical history, family history, social history, and previous encounter notes pertinent to  patient's obesity diagnosis. I have spent 43 minutes in the care of the patient today including: preparing to see patient (e.g. review and interpretation of tests, old notes ), obtaining and/or reviewing separately obtained history, performing a medically appropriate examination or evaluation, counseling and educating the patient, ordering medications, test or procedures, documenting clinical information in the electronic or other health care record, and independently interpreting results and communicating results to the patient, family, or caregiver   I have reviewed the above documentation for accuracy and completeness, and I agree with the above. [name], D.O.  The 21st Century Cures Act was signed into law in 2016 which includes the topic of electronic health records.  This provides immediate access to information in MyChart.  This includes consultation notes, operative notes, office notes, lab results and pathology reports.  If you have any questions about what you read please let us know at your next visit so we can discuss your concerns and take corrective action if need be.  We are right here with you.

## 2023-02-24 ENCOUNTER — Encounter (INDEPENDENT_AMBULATORY_CARE_PROVIDER_SITE_OTHER): Payer: Self-pay | Admitting: Family Medicine

## 2023-02-24 ENCOUNTER — Other Ambulatory Visit (HOSPITAL_COMMUNITY): Payer: Self-pay

## 2023-02-24 ENCOUNTER — Other Ambulatory Visit: Payer: Self-pay

## 2023-02-24 ENCOUNTER — Ambulatory Visit (INDEPENDENT_AMBULATORY_CARE_PROVIDER_SITE_OTHER): Payer: 59 | Admitting: Family Medicine

## 2023-02-24 VITALS — BP 130/72 | HR 73 | Temp 98.2°F | Ht 65.0 in | Wt 271.0 lb

## 2023-02-24 DIAGNOSIS — E88819 Insulin resistance, unspecified: Secondary | ICD-10-CM | POA: Diagnosis not present

## 2023-02-24 DIAGNOSIS — G4733 Obstructive sleep apnea (adult) (pediatric): Secondary | ICD-10-CM

## 2023-02-24 DIAGNOSIS — F5089 Other specified eating disorder: Secondary | ICD-10-CM

## 2023-02-24 DIAGNOSIS — Z6841 Body Mass Index (BMI) 40.0 and over, adult: Secondary | ICD-10-CM

## 2023-02-24 DIAGNOSIS — R632 Polyphagia: Secondary | ICD-10-CM

## 2023-02-24 DIAGNOSIS — E559 Vitamin D deficiency, unspecified: Secondary | ICD-10-CM

## 2023-02-24 MED ORDER — BUPROPION HCL ER (XL) 300 MG PO TB24
300.0000 mg | ORAL_TABLET | Freq: Every morning | ORAL | 0 refills | Status: DC
  Filled 2023-02-24: qty 30, 30d supply, fill #0
  Filled 2023-03-31: qty 30, 30d supply, fill #1

## 2023-02-24 MED ORDER — METFORMIN HCL ER 500 MG PO TB24
1000.0000 mg | ORAL_TABLET | Freq: Every day | ORAL | 0 refills | Status: DC
  Filled 2023-02-24: qty 60, 30d supply, fill #0

## 2023-02-24 MED ORDER — TIRZEPATIDE-WEIGHT MANAGEMENT 2.5 MG/0.5ML ~~LOC~~ SOAJ
2.5000 mg | SUBCUTANEOUS | 0 refills | Status: DC
  Filled 2023-02-24 (×2): qty 2, 28d supply, fill #0

## 2023-02-24 MED ORDER — VITAMIN D (ERGOCALCIFEROL) 1.25 MG (50000 UNIT) PO CAPS
50000.0000 [IU] | ORAL_CAPSULE | ORAL | 0 refills | Status: AC
  Filled 2023-02-24: qty 4, 28d supply, fill #0
  Filled 2023-03-31: qty 12, 84d supply, fill #0

## 2023-02-24 NOTE — Progress Notes (Signed)
 [name], D.O.  ABFM, ABOM Specializing in Clinical Bariatric Medicine  Office located at: [address] [place], [place]  [address]   Assessment and Plan:   FOR THE DISEASE OF OBESITY: BMI 40.0-44.9, adult (HCC) Current BMI 45.1 Morbid obesity (HCC)-START BMI 51.49/DATE 03/26/20 Assessment & Plan: Since last office visit on 01/10/23 patient's muscle mass has increased by 1 lb. Fat mass has increased by 1.4 lb. Total body water has increased by 3 lb.  Counseling done on how various foods will affect these numbers and how to maximize success  Total lbs lost to date: 29 lbs  Total weight loss percentage to date: 9.67%    Recommended Dietary Goals Daneen is currently in the action stage of change. As such, her goal is to continue weight management plan.  She has agreed to: continue current plan   Behavioral Intervention We discussed the following today: using the internet to search for low calorie, low carb, high protein chicken recipes, choosing foods with a 10:1 ratio of calories to grams of protein.  Additional resources provided today:  handouts on various recipes, handout on food journaling log   Evidence-based interventions for health behavior change were utilized today including the discussion of self monitoring techniques, problem-solving barriers and SMART goal setting techniques.   Regarding patient's less desirable eating habits and patterns, we employed the technique of small changes.   Pt will specifically work on: n/a   Recommended Physical Activity Goals Marybella has been advised to work up to 150 minutes of moderate intensity aerobic activity a week and strengthening exercises 2-3 times per week for cardiovascular health, weight loss maintenance and preservation of muscle mass.   She has agreed to : Think about enjoyable ways to increase daily physical activity and overcoming barriers to exercise   Pharmacotherapy We both agreed to : begin  Zepbound . If GLP-1 is not approved, we may consider restarting Phentermine .   Patient denies a personal history of pancreatitis;  and denies a family history of medullary thyroid  carcinoma or multiple endocrine neoplasia type II.  I informed the patient that the demand for GLP-1-like medications is high and supply is low, therefore the medication can be difficult to obtain.  Strategies to obtain discussed with patient.  Potential risks/ benefits of the medication were explained to patient. Explained that the more she eats off-plan, the more likely for her to have side effects of the drug.  All questions were answered appropriately and alternative treatment options were discussed; patient wishes to move forward with the medication at this time.    FOR ASSOCIATED CONDITIONS ADDRESSED TODAY:  OSA on CPAP Assessment & Plan: Condition treated by [facility] sleep medicine physician. Pt with a known hx of OSA - she was diagnosed 4 years ago and has been on CPAP nightly with a 100% compliance since then. Pt still suffers from daytime tiredness and has to frequently take naps during her work breaks. Continue with weight loss therapy and CPAP compliance. Start GLP-1 therapy.   Orders: -     Tirzepatide -Weight Management; Inject 2.5 mg into the skin once a week.  Dispense: 2 mL; Refill: 0   Insulin  resistance Assessment & Plan: Lab Results  Component Value Date   HGBA1C 5.2 07/29/2022   HGBA1C 5.5 12/14/2021   HGBA1C 5.4 03/23/2021   INSULIN  13.2 12/23/2022   INSULIN  10.9 07/29/2022   INSULIN  18.7 12/14/2021    Most recent Hemoglobin A1c and fasting insulin  above. Pt currently on Metformin  XR  500 mg twice daily, compliance good. Patient denies gastrointestinal issues. Continue weight loss therapy by decreasing simple carbs/ sugars; increasing fiber and proteins -> follow her meal plan. Maintain with Metformin  at current dose.   Orders: -     metFORMIN  HCl ER; Take 2 tablets (1,000 mg total) by mouth  daily.  Dispense: 60 tablet; Refill: 0   Other Specified Feeding or Eating Disorder, Emotional and Binge Eating Behaviors / Polyphagia  Assessment & Plan: Relevant medications: Wellbutrin  XL 300 mg daily - pt tolerating medication well, compliance good. Mood has been relatively stable. Pt still struggling with abnormal hunger and cravings. Pt will work on increasing protein intake. Continue Wellbutrin  at current dose. Start GLP-1 therapy.   Orders:  -     buPROPion  HCl ER (XL); Take 1 tablet (300 mg total) by mouth in the morning.  Dispense: 90 tablet; Refill: 0   Vitamin D  deficiency Assessment & Plan: Lab Results  Component Value Date   VD25OH 56.2 12/23/2022   VD25OH 53.3 07/29/2022   VD25OH 25.6 (L) 12/14/2021   Most recent vitamin D  above. [name] is compliant and tolerant with ERGO 50,000 units once weekly. Continue with high dose vitamin D . Recheck in 1-2 months.  Orders: -     Vitamin D  (Ergocalciferol ); Take 1 capsule (50,000 Units total) by mouth every 7 (seven) days.  Dispense: 12 capsule; Refill: 0   Follow up:   Return 03/17/23. She was informed of the importance of frequent follow up visits to maximize her success with intensive lifestyle modifications for her multiple health conditions.  Subjective:   Chief complaint: Obesity [name] is here to discuss her progress with her obesity treatment plan. She is on the Category 3 Plan and keeping a food journal and adhering to recommended goals of 1400-1500 calories and 110-120+ protein and states she is following her eating plan approximately 50% of the time. She states she is doing a stretching routine 10 minutes 7 days per week.  Interval History:  [name] is here for a follow up office visit. Since last OV on 01/10/23, Mrs.Heier is up 3 lbs. Pt did not journal over the holiday period, but has returned to doing so. She has been trying new recipes from our clinic and Pinterest. Pt admits to still struggling with  hunger and cravings. Pt inquires about Zepbound  for OSA indication.  Pharmacotherapy for weight loss: She is currently taking  Wellbutrin  XL 300 mg daily and Metformin  XR 500 mg two tablets daily .   Review of Systems:  Pertinent positives were addressed with patient today.  Reviewed by clinician on day of visit: allergies, medications, problem list, medical history, surgical history, family history, social history, and previous encounter notes.  Weight Summary and Biometrics   Weight Lost Since Last Visit: 0lb  Weight Gained Since Last Visit: 3lb    Vitals Temp: 98.2 F (36.8 C) BP: 130/72 Pulse Rate: 73 SpO2: 100 %   Anthropometric Measurements Height: 5' 5 (1.651 m) Weight: 271 lb (122.9 kg) BMI (Calculated): 45.1 Weight at Last Visit: 268lb Weight Lost Since Last Visit: 0lb Weight Gained Since Last Visit: 3lb Starting Weight: 300lb Total Weight Loss (lbs): 29 lb (13.2 kg) Peak Weight: 318lb   Body Composition  Body Fat %: 52.9 % Fat Mass (lbs): 143.4 lbs Muscle Mass (lbs): 121.4 lbs Total Body Water (lbs): 99.8 lbs Visceral Fat Rating : 17   Other Clinical Data Fasting: no Labs: no Today's Visit #: 47 Starting Date: 03/26/20  Objective:   PHYSICAL EXAM: Blood pressure 130/72, pulse 73, temperature 98.2 F (36.8 C), height 5' 5 (1.651 m), weight 271 lb (122.9 kg), SpO2 100%. Body mass index is 45.1 kg/m.  General: she is overweight, cooperative and in no acute distress. PSYCH: Has normal mood, affect and thought process.   HEENT: EOMI, sclerae are anicteric. Lungs: Normal breathing effort, no conversational dyspnea. Extremities: Moves * 4 Neurologic: A and O * 3, good insight  DIAGNOSTIC DATA REVIEWED: BMET    Component Value Date/Time   NA 139 12/23/2022 0844   NA 138 10/14/2011 0752   K 4.5 12/23/2022 0844   K 4.1 10/14/2011 0752   CL 102 12/23/2022 0844   CL 106 10/14/2011 0752   CO2 25 12/23/2022 0844   CO2 27 10/14/2011 0752    GLUCOSE 84 12/23/2022 0844   GLUCOSE 87 04/16/2016 1123   GLUCOSE 90 10/14/2011 0752   BUN 15 12/23/2022 0844   BUN 11 10/14/2011 0752   CREATININE 0.79 12/23/2022 0844   CREATININE 0.55 (L) 10/14/2011 0752   CALCIUM 9.3 12/23/2022 0844   CALCIUM 9.0 10/14/2011 0752   GFRNONAA >90 01/29/2014 1950   GFRNONAA >60 10/14/2011 0752   GFRAA >90 01/29/2014 1950   GFRAA >60 10/14/2011 0752   Lab Results  Component Value Date   HGBA1C 5.2 07/29/2022   HGBA1C 5.4 03/23/2021   Lab Results  Component Value Date   INSULIN  13.2 12/23/2022   INSULIN  22.2 03/26/2020   Lab Results  Component Value Date   TSH 2.330 12/23/2022   CBC    Component Value Date/Time   WBC 5.5 12/23/2022 0844   WBC 8.0 12/15/2015 0905   RBC 3.85 12/23/2022 0844   RBC 3.86 (L) 12/15/2015 0905   HGB 10.9 (L) 12/23/2022 0844   HCT 35.5 12/23/2022 0844   PLT 305 12/23/2022 0844   MCV 92 12/23/2022 0844   MCV 91 10/14/2011 0752   MCH 28.3 12/23/2022 0844   MCH 29.8 12/15/2015 0905   MCHC 30.7 (L) 12/23/2022 0844   MCHC 33.0 12/15/2015 0905   RDW 14.1 12/23/2022 0844   RDW 14.1 10/14/2011 0752   Iron Studies No results found for: IRON, TIBC, FERRITIN, IRONPCTSAT Lipid Panel     Component Value Date/Time   CHOL 218 (H) 12/23/2022 0844   TRIG 104 12/23/2022 0844   HDL 55 12/23/2022 0844   LDLCALC 144 (H) 12/23/2022 0844   Hepatic Function Panel     Component Value Date/Time   PROT 6.7 12/23/2022 0844   PROT 7.4 10/14/2011 0752   ALBUMIN 4.3 12/23/2022 0844   ALBUMIN 3.8 10/14/2011 0752   AST 16 12/23/2022 0844   AST 8 (L) 10/14/2011 0752   ALT 14 12/23/2022 0844   ALT 13 10/14/2011 0752   ALKPHOS 54 12/23/2022 0844   ALKPHOS 48 (L) 10/14/2011 0752   BILITOT 0.3 12/23/2022 0844   BILITOT 0.4 10/14/2011 0752      Component Value Date/Time   TSH 2.330 12/23/2022 0844   Nutritional Lab Results  Component Value Date   VD25OH 56.2 12/23/2022   VD25OH 53.3 07/29/2022   VD25OH 25.6  (L) 12/14/2021    Attestations:   I, Special Puri, acting as a stage manager for Tiffany Jenkins, DO., have compiled all relevant documentation for today's office visit on behalf of Tiffany Jenkins, DO, while in the presence of [employer], DO.  Reviewed by clinician on day of visit: allergies, medications, problem list, medical history, surgical history, family history,  social history, and previous encounter notes pertinent to patient's obesity diagnosis. I have spent 40 minutes in the care of the patient today including: preparing to see patient (e.g. review and interpretation of tests, old notes ), obtaining and/or reviewing separately obtained history, performing a medically appropriate examination or evaluation, counseling and educating the patient, ordering medications, test or procedures, documenting clinical information in the electronic or other health care record, and independently interpreting results and communicating results to the patient, family, or caregiver   I have reviewed the above documentation for accuracy and completeness, and I agree with the above. Tiffany JINNY [name], D.O.  The 21st Century Cures Act was signed into law in 2016 which includes the topic of electronic health records.  This provides immediate access to information in MyChart.  This includes consultation notes, operative notes, office notes, lab results and pathology reports.  If you have any questions about what you read please let us  know at your next visit so we can discuss your concerns and take corrective action if need be.  We are right here with you.

## 2023-02-25 ENCOUNTER — Other Ambulatory Visit: Payer: Self-pay

## 2023-02-25 ENCOUNTER — Other Ambulatory Visit (HOSPITAL_COMMUNITY): Payer: Self-pay

## 2023-03-11 ENCOUNTER — Other Ambulatory Visit: Payer: Self-pay

## 2023-03-17 ENCOUNTER — Ambulatory Visit (INDEPENDENT_AMBULATORY_CARE_PROVIDER_SITE_OTHER): Payer: 59 | Admitting: Family Medicine

## 2023-03-17 ENCOUNTER — Other Ambulatory Visit (HOSPITAL_COMMUNITY): Payer: Self-pay

## 2023-03-17 ENCOUNTER — Encounter (INDEPENDENT_AMBULATORY_CARE_PROVIDER_SITE_OTHER): Payer: Self-pay | Admitting: Family Medicine

## 2023-03-17 VITALS — HR 76 | Temp 98.0°F | Ht 65.0 in | Wt 269.0 lb

## 2023-03-17 DIAGNOSIS — G4733 Obstructive sleep apnea (adult) (pediatric): Secondary | ICD-10-CM

## 2023-03-17 DIAGNOSIS — E88819 Insulin resistance, unspecified: Secondary | ICD-10-CM | POA: Diagnosis not present

## 2023-03-17 DIAGNOSIS — Z6841 Body Mass Index (BMI) 40.0 and over, adult: Secondary | ICD-10-CM

## 2023-03-17 MED ORDER — METFORMIN HCL ER 500 MG PO TB24
1500.0000 mg | ORAL_TABLET | Freq: Every day | ORAL | 0 refills | Status: DC
  Filled 2023-03-17 – 2023-03-31 (×2): qty 90, 30d supply, fill #0

## 2023-03-17 NOTE — Progress Notes (Signed)
[name], D.O.  ABFM, ABOM Specializing in Clinical Bariatric Medicine  Office located at: [address] [place], [place]  [address]   Assessment and Plan:   FOR THE DISEASE OF OBESITY: BMI 40.0-44.9, adult (HCC) Current BMI 45.1 Morbid obesity (HCC)-START BMI 51.49/DATE 03/26/20 Assessment & Plan: Since last office visit on 02/24/2023 patient's  Muscle mass has decreased by 0.6 lb. Fat mass has decreased by 1.2 lb. Total body water has decreased by 1.6 lb.  Counseling done on how various foods will affect these numbers and how to maximize success  Total lbs lost to date: 31 lbs  Total weight loss percentage to date: 13.00%    Recommended Dietary Goals [name] is currently in the action stage of change. As such, her goal is to continue weight management plan.  She has agreed to: continue current plan   Behavioral Intervention We discussed the following today: using GPT or another AI platform for recipe ideas- searching "low calorie, low carb, high protein chicken recipes" etc  Additional resources provided today: None  Evidence-based interventions for health behavior change were utilized today including the discussion of self monitoring techniques, problem-solving barriers and SMART goal setting techniques.   Regarding patient's less desirable eating habits and patterns, we employed the technique of small changes.   Pt will specifically work on: n/a   Recommended Physical Activity Goals [name] has been advised to work up to 150 minutes of moderate intensity aerobic activity a week and strengthening exercises 2-3 times per week for cardiovascular health, weight loss maintenance and preservation of muscle mass.   She has agreed to : weight lifting at least 3 days a week and walking 30 minutes daily.   Pharmacotherapy We both agreed to :  increase Metformin and continue Wellbutrin   FOR ASSOCIATED CONDITIONS ADDRESSED TODAY:  OSA on CPAP Assessment &  Plan: Pt with a known hx of OSA - she was diagnosed 4 years ago and has been on CPAP nightly with a 100% compliance rate. LOV, we wrote for Zepbound. Pt states that she has not heard anything regarding the Zepbound yet; she assumes that it got denied. Advised pt to contact her insurance and or pharmacy about the status of her GLP-1. Recommended pt to try getting the Zepbound approved through her sleep medicine specialist if it gets denied.    Insulin resistance Assessment & Plan: Relevant medication: Metformin XR 500 mg two tablets daily. Pt endorses feeling "randomly hungry throughout the day". The hunger arises at different times - sometimes in the morning, lunch or evening. Shared decision: Increase Metformin. [name] will also work on increasing protein intake for appetite suppression and reducing simple and added sugars in her diet.   Orders: -     metFORMIN HCl ER; Take 3 tablets (1,500 mg total) by mouth daily.  Dispense: 90 tablet; Refill: 0   Follow up:   Return 04/14/2023. She was informed of the importance of frequent follow up visits to maximize her success with intensive lifestyle modifications for her multiple health conditions.  Subjective:   Chief complaint: Obesity [name] is here to discuss her progress with her obesity treatment plan. She is on the Category 3 Plan and keeping a food journal and adhering to recommended goals of 1400-1500 calories and 110-120+ protein and states she is following her eating plan approximately 50% of the time. She states she is walking 15 minutes 2 days per week & resistance bands 15 minutes, 2 days per week.  Interval History:  [name] is here for a follow up office visit. Since last OV, Mrs.Fuhs is down 2 lbs. She recently had steroid injections in her knee which caused her to not be able to exercise as much as she would like. 2 weeks ago, she endorses hitting her journaling parameters about 50% of the time. Last week, she adhered to  her journaling parameters 100% of the time. She has been increasing her water intake.   Pharmacotherapy for weight loss: She is currently taking  Wellbutrin XL 300 mg daily and Metformin XR 500 mg two tablets daily .   Review of Systems:  Pertinent positives were addressed with patient today.  Reviewed by clinician on day of visit: allergies, medications, problem list, medical history, surgical history, family history, social history, and previous encounter notes.  Weight Summary and Biometrics   Weight Lost Since Last Visit: 2 lb  Weight Gained Since Last Visit: 0    Vitals Temp: 98 F (36.7 C) Pulse Rate: 76 SpO2: 100 %   Anthropometric Measurements Height: 5\' 5"  (1.651 m) Weight: 269 lb (122 kg) BMI (Calculated): 44.76 Weight at Last Visit: 271 lb Weight Lost Since Last Visit: 2 lb Weight Gained Since Last Visit: 0 Starting Weight: 300 lb Total Weight Loss (lbs): 31 lb (14.1 kg) Peak Weight: 318 lb   Body Composition  Body Fat %: 52.8 % Fat Mass (lbs): 142.2 lbs Muscle Mass (lbs): 120.8 lbs Total Body Water (lbs): 98.2 lbs Visceral Fat Rating : 17   Other Clinical Data Fasting: No Labs: NO Today's Visit #: 48 Starting Date: 03/26/20    Objective:   PHYSICAL EXAM: Pulse 76, temperature 98 F (36.7 C), height 5\' 5"  (1.651 m), weight 269 lb (122 kg), SpO2 100%. Body mass index is 44.76 kg/m.  General: she is overweight, cooperative and in no acute distress. PSYCH: Has normal mood, affect and thought process.   HEENT: EOMI, sclerae are anicteric. Lungs: Normal breathing effort, no conversational dyspnea. Extremities: Moves * 4 Neurologic: A and O * 3, good insight  DIAGNOSTIC DATA REVIEWED: BMET    Component Value Date/Time   NA 139 12/23/2022 0844   NA 138 10/14/2011 0752   K 4.5 12/23/2022 0844   K 4.1 10/14/2011 0752   CL 102 12/23/2022 0844   CL 106 10/14/2011 0752   CO2 25 12/23/2022 0844   CO2 27 10/14/2011 0752   GLUCOSE 84  12/23/2022 0844   GLUCOSE 87 04/16/2016 1123   GLUCOSE 90 10/14/2011 0752   BUN 15 12/23/2022 0844   BUN 11 10/14/2011 0752   CREATININE 0.79 12/23/2022 0844   CREATININE 0.55 (L) 10/14/2011 0752   CALCIUM 9.3 12/23/2022 0844   CALCIUM 9.0 10/14/2011 0752   GFRNONAA >90 01/29/2014 1950   GFRNONAA >60 10/14/2011 0752   GFRAA >90 01/29/2014 1950   GFRAA >60 10/14/2011 0752   Lab Results  Component Value Date   HGBA1C 5.2 07/29/2022   HGBA1C 5.4 03/23/2021   Lab Results  Component Value Date   INSULIN 13.2 12/23/2022   INSULIN 22.2 03/26/2020   Lab Results  Component Value Date   TSH 2.330 12/23/2022   CBC    Component Value Date/Time   WBC 5.5 12/23/2022 0844   WBC 8.0 12/15/2015 0905   RBC 3.85 12/23/2022 0844   RBC 3.86 (L) 12/15/2015 0905   HGB 10.9 (L) 12/23/2022 0844   HCT 35.5 12/23/2022 0844   PLT 305 12/23/2022 0844   MCV 92 12/23/2022  0844   MCV 91 10/14/2011 0752   MCH 28.3 12/23/2022 0844   MCH 29.8 12/15/2015 0905   MCHC 30.7 (L) 12/23/2022 0844   MCHC 33.0 12/15/2015 0905   RDW 14.1 12/23/2022 0844   RDW 14.1 10/14/2011 0752   Iron Studies No results found for: "IRON", "TIBC", "FERRITIN", "IRONPCTSAT" Lipid Panel     Component Value Date/Time   CHOL 218 (H) 12/23/2022 0844   TRIG 104 12/23/2022 0844   HDL 55 12/23/2022 0844   LDLCALC 144 (H) 12/23/2022 0844   Hepatic Function Panel     Component Value Date/Time   PROT 6.7 12/23/2022 0844   PROT 7.4 10/14/2011 0752   ALBUMIN 4.3 12/23/2022 0844   ALBUMIN 3.8 10/14/2011 0752   AST 16 12/23/2022 0844   AST 8 (L) 10/14/2011 0752   ALT 14 12/23/2022 0844   ALT 13 10/14/2011 0752   ALKPHOS 54 12/23/2022 0844   ALKPHOS 48 (L) 10/14/2011 0752   BILITOT 0.3 12/23/2022 0844   BILITOT 0.4 10/14/2011 0752      Component Value Date/Time   TSH 2.330 12/23/2022 0844   Nutritional Lab Results  Component Value Date   VD25OH 56.2 12/23/2022   VD25OH 53.3 07/29/2022   VD25OH 25.6 (L)  12/14/2021    Attestations:   I, Special Puri, acting as a Stage manager for Thomasene Lot, DO., have compiled all relevant documentation for today's office visit on behalf of Thomasene Lot, DO, while in the presence of [employer], DO.  Reviewed by clinician on day of visit: allergies, medications, problem list, medical history, surgical history, family history, social history, and previous encounter notes pertinent to patient's obesity diagnosis. I have spent 40 minutes in the care of the patient today including: preparing to see patient (e.g. review and interpretation of tests, old notes ), obtaining and/or reviewing separately obtained history, performing a medically appropriate examination or evaluation, counseling and educating the patient, ordering medications, test or procedures, documenting clinical information in the electronic or other health care record, and independently interpreting results and communicating results to the patient, family, or caregiver   I have reviewed the above documentation for accuracy and completeness, and I agree with the above. [name], D.O.  The 21st Century Cures Act was signed into law in 2016 which includes the topic of electronic health records.  This provides immediate access to information in MyChart.  This includes consultation notes, operative notes, office notes, lab results and pathology reports.  If you have any questions about what you read please let us know at your next visit so we can discuss your concerns and take corrective action if need be.  We are right here with you.

## 2023-03-18 ENCOUNTER — Other Ambulatory Visit (INDEPENDENT_AMBULATORY_CARE_PROVIDER_SITE_OTHER): Payer: Self-pay | Admitting: Family Medicine

## 2023-03-18 DIAGNOSIS — R632 Polyphagia: Secondary | ICD-10-CM

## 2023-03-18 DIAGNOSIS — F5089 Other specified eating disorder: Secondary | ICD-10-CM

## 2023-03-24 ENCOUNTER — Other Ambulatory Visit: Payer: Self-pay

## 2023-03-24 ENCOUNTER — Telehealth: Payer: Self-pay

## 2023-03-24 ENCOUNTER — Other Ambulatory Visit (HOSPITAL_COMMUNITY): Payer: Self-pay

## 2023-03-24 NOTE — Telephone Encounter (Signed)
 PA submitted through Cover My Meds for Zepbound . Awaiting insurance determination. Key: [id number])

## 2023-03-24 NOTE — Telephone Encounter (Signed)
 Per patients insurance: Drug is not covered by plan

## 2023-03-31 ENCOUNTER — Other Ambulatory Visit (HOSPITAL_COMMUNITY): Payer: Self-pay

## 2023-03-31 ENCOUNTER — Encounter (INDEPENDENT_AMBULATORY_CARE_PROVIDER_SITE_OTHER): Payer: Self-pay

## 2023-03-31 ENCOUNTER — Other Ambulatory Visit: Payer: Self-pay

## 2023-04-05 ENCOUNTER — Other Ambulatory Visit (HOSPITAL_COMMUNITY): Payer: Self-pay

## 2023-04-14 ENCOUNTER — Ambulatory Visit (INDEPENDENT_AMBULATORY_CARE_PROVIDER_SITE_OTHER): Payer: 59 | Admitting: Family Medicine

## 2023-04-14 ENCOUNTER — Telehealth (INDEPENDENT_AMBULATORY_CARE_PROVIDER_SITE_OTHER): Payer: Self-pay | Admitting: Family Medicine

## 2023-04-14 VITALS — BP 117/76 | HR 84 | Temp 98.0°F | Ht 65.0 in | Wt 268.0 lb

## 2023-04-14 DIAGNOSIS — E559 Vitamin D deficiency, unspecified: Secondary | ICD-10-CM

## 2023-04-14 DIAGNOSIS — F5089 Other specified eating disorder: Secondary | ICD-10-CM

## 2023-04-14 DIAGNOSIS — Z6841 Body Mass Index (BMI) 40.0 and over, adult: Secondary | ICD-10-CM

## 2023-04-14 DIAGNOSIS — E88819 Insulin resistance, unspecified: Secondary | ICD-10-CM

## 2023-04-14 MED ORDER — PHENTERMINE-TOPIRAMATE ER 3.75-23 MG PO CP24: ORAL_CAPSULE | ORAL | 0 refills | Status: DC

## 2023-04-14 NOTE — Progress Notes (Incomplete)
[name], D.O.  ABFM, ABOM Specializing in Clinical Bariatric Medicine  Office located at: [address] [place], [place]  [address]   Assessment and Plan:   FOR THE DISEASE OF OBESITY: BMI 40.0-44.9, adult (HCC) - Current BMI 44.6 Morbid obesity (HCC)-START BMI 51.49/DATE 03/26/20 Assessment & Plan: Since last office visit on 03/17/23 patient's muscle mass has {DID:29233} by ***lb. Fat mass has {DID:29233} by ***lb. Total body water has {DID:29233} by ***lb.  Counseling done on how various foods will affect these numbers and how to maximize success  Total lbs lost to date: *** Total weight loss percentage to date: ***    Recommended Dietary Goals Emry is currently in the action stage of change. As such, her goal is to continue weight management plan.  She has agreed to: {EMWTLOSSPLAN:29297::"continue current plan"}   Behavioral Intervention We discussed the following today: {dowtlossstrategies:31654}  Additional resources provided today: {DOhandouts:31655::"None"}  Evidence-based interventions for health behavior change were utilized today including the discussion of self monitoring techniques, problem-solving barriers and SMART goal setting techniques.   Regarding patient's less desirable eating habits and patterns, we employed the technique of small changes.   Pt will specifically work on: *** for next visit.    Recommended Physical Activity Goals Tonnya has been advised to work up to 150 minutes of moderate intensity aerobic activity a week and strengthening exercises 2-3 times per week for cardiovascular health, weight loss maintenance and preservation of muscle mass.   She has agreed to :  {EMEXERCISE:28847::"Think about enjoyable ways to increase daily physical activity and overcoming barriers to exercise","Increase physical activity in their day and reduce sedentary time (increase NEAT)."}   Pharmacotherapy We both agreed to :  {EMagreedrx:29170}   FOR ASSOCIATED CONDITIONS ADDRESSED TODAY: Insulin resistance Assessment & Plan: Lab Results  Component Value Date   HGBA1C 5.2 07/29/2022   HGBA1C 5.5 12/14/2021   HGBA1C 5.4 03/23/2021   INSULIN 13.2 12/23/2022   INSULIN 10.9 07/29/2022   INSULIN 18.7 12/14/2021    She is currently compliant with metformin 500 mg TID. She reports some nausea      Follow up:   Return in about 3 weeks (around 05/05/2023). She was informed of the importance of frequent follow up visits to maximize her success with intensive lifestyle modifications for her multiple health conditions.  Subjective:   Chief complaint: Obesity [name] is here to discuss her progress with her obesity treatment plan. She is on the Category 3 Plan and keeping a food journal and adhering to recommended goals of 1400 calories and 1500 g of protein and states she is following her eating plan approximately 25% of the time. She states she is lifting a weighted ball 20-30 minutes 7 days per week and walking 20-30 minutes 1 days per week.  Interval History:  [name] is here for a follow up office visit. Since last OV on 03/17/2023, she is up 1 lb. She recently contracted strep throat and endorses sore throat, headaches, nausea, and fever. She adds that she experienced episodes of hallucinations as well. She was managing with Tylenol and Ibuprofen. She will take her last dose of antibiotics tomorrow. While recovering she has not been eating on plan; was eating a lot of ice cream and soups. She reports increased hunger.   Pharmacotherapy for weight loss: She is currently taking {EMPharmaco:28845}.   Review of Systems:  Pertinent positives were addressed with patient today.  Reviewed by clinician on day of visit: allergies, medications,  problem list, medical history, surgical history, family history, social history, and previous encounter notes.  Weight Summary and Biometrics   Weight Lost Since Last  Visit: 1 lb  Weight Gained Since Last Visit: 0  ***  Vitals Temp: 98 F (36.7 C) BP: 117/76 Pulse Rate: 84 SpO2: 100 %   Anthropometric Measurements Height: 5\' 5"  (1.651 m) Weight: 268 lb (121.6 kg) BMI (Calculated): 44.6 Weight at Last Visit: 265 lb Weight Lost Since Last Visit: 1 lb Weight Gained Since Last Visit: 0 Starting Weight: 300 lb Total Weight Loss (lbs): 32 lb (14.5 kg) Peak Weight: 318 lb   Body Composition  Body Fat %: 51.8 % Fat Mass (lbs): 139 lbs Muscle Mass (lbs): 122.8 lbs Total Body Water (lbs): 97.2 lbs Visceral Fat Rating : 16   Other Clinical Data Fasting: yes Labs: no Today's Visit #: 49 Starting Date: 03/26/20    Objective:   PHYSICAL EXAM: Blood pressure 117/76, pulse 84, temperature 98 F (36.7 C), height 5\' 5"  (1.651 m), weight 268 lb (121.6 kg), SpO2 100%. Body mass index is 44.6 kg/m.  General: she is overweight, cooperative and in no acute distress. PSYCH: Has normal mood, affect and thought process.   HEENT: EOMI, sclerae are anicteric. Lungs: Normal breathing effort, no conversational dyspnea. Extremities: Moves * 4 Neurologic: A and O * 3, good insight  DIAGNOSTIC DATA REVIEWED: BMET    Component Value Date/Time   NA 139 12/23/2022 0844   NA 138 10/14/2011 0752   K 4.5 12/23/2022 0844   K 4.1 10/14/2011 0752   CL 102 12/23/2022 0844   CL 106 10/14/2011 0752   CO2 25 12/23/2022 0844   CO2 27 10/14/2011 0752   GLUCOSE 84 12/23/2022 0844   GLUCOSE 87 04/16/2016 1123   GLUCOSE 90 10/14/2011 0752   BUN 15 12/23/2022 0844   BUN 11 10/14/2011 0752   CREATININE 0.79 12/23/2022 0844   CREATININE 0.55 (L) 10/14/2011 0752   CALCIUM 9.3 12/23/2022 0844   CALCIUM 9.0 10/14/2011 0752   GFRNONAA >90 01/29/2014 1950   GFRNONAA >60 10/14/2011 0752   GFRAA >90 01/29/2014 1950   GFRAA >60 10/14/2011 0752   Lab Results  Component Value Date   HGBA1C 5.2 07/29/2022   HGBA1C 5.4 03/23/2021   Lab Results  Component  Value Date   INSULIN 13.2 12/23/2022   INSULIN 22.2 03/26/2020   Lab Results  Component Value Date   TSH 2.330 12/23/2022   CBC    Component Value Date/Time   WBC 5.5 12/23/2022 0844   WBC 8.0 12/15/2015 0905   RBC 3.85 12/23/2022 0844   RBC 3.86 (L) 12/15/2015 0905   HGB 10.9 (L) 12/23/2022 0844   HCT 35.5 12/23/2022 0844   PLT 305 12/23/2022 0844   MCV 92 12/23/2022 0844   MCV 91 10/14/2011 0752   MCH 28.3 12/23/2022 0844   MCH 29.8 12/15/2015 0905   MCHC 30.7 (L) 12/23/2022 0844   MCHC 33.0 12/15/2015 0905   RDW 14.1 12/23/2022 0844   RDW 14.1 10/14/2011 0752   Iron Studies No results found for: "IRON", "TIBC", "FERRITIN", "IRONPCTSAT" Lipid Panel     Component Value Date/Time   CHOL 218 (H) 12/23/2022 0844   TRIG 104 12/23/2022 0844   HDL 55 12/23/2022 0844   LDLCALC 144 (H) 12/23/2022 0844   Hepatic Function Panel     Component Value Date/Time   PROT 6.7 12/23/2022 0844   PROT 7.4 10/14/2011 0752   ALBUMIN 4.3 12/23/2022 0844  ALBUMIN 3.8 10/14/2011 0752   AST 16 12/23/2022 0844   AST 8 (L) 10/14/2011 0752   ALT 14 12/23/2022 0844   ALT 13 10/14/2011 0752   ALKPHOS 54 12/23/2022 0844   ALKPHOS 48 (L) 10/14/2011 0752   BILITOT 0.3 12/23/2022 0844   BILITOT 0.4 10/14/2011 0752      Component Value Date/Time   TSH 2.330 12/23/2022 0844   Nutritional Lab Results  Component Value Date   VD25OH 56.2 12/23/2022   VD25OH 53.3 07/29/2022   VD25OH 25.6 (L) 12/14/2021    Attestations:   I, ***, acting as a medical scribe for Thomasene Lot, DO., have compiled all relevant documentation for today's office visit on behalf of Thomasene Lot, DO, while in the presence of [employer], DO.  Reviewed by clinician on day of visit: allergies, medications, problem list, medical history, surgical history, family history, social history, and previous encounter notes pertinent to patient's obesity diagnosis. I have spent 40 *** minutes in the care of the  patient today including: preparing to see patient (e.g. review and interpretation of tests, old notes ), obtaining and/or reviewing separately obtained history, performing a medically appropriate examination or evaluation, counseling and educating the patient, ordering medications, test or procedures, documenting clinical information in the electronic or other health care record, and independently interpreting results and communicating results to the patient, family, or caregiver   I have reviewed the above documentation for accuracy and completeness, and I agree with the above. [name], D.O.  The 21st Century Cures Act was signed into law in 2016 which includes the topic of electronic health records.  This provides immediate access to information in MyChart.  This includes consultation notes, operative notes, office notes, lab results and pathology reports.  If you have any questions about what you read please let us know at your next visit so we can discuss your concerns and take corrective action if need be.  We are right here with you.

## 2023-04-14 NOTE — Telephone Encounter (Signed)
 Good morning!  Pharmacy states that they need a "alternative medication" to the prescribed "qysmia". The patient states that they think the pharmacy is out of this specific thing but they can't get the pharmacy on the phone. She can not physically go to the pharmacy until EOD. She said she's okay with it going to another pharmacy if necessary. The original message from the pharmacy came through from an app.   Thanks!

## 2023-04-17 ENCOUNTER — Encounter (INDEPENDENT_AMBULATORY_CARE_PROVIDER_SITE_OTHER): Payer: Self-pay | Admitting: Family Medicine

## 2023-04-17 NOTE — Progress Notes (Signed)
 [name], D.O.  ABFM, ABOM Specializing in Clinical Bariatric Medicine  Office located at: [address] [place], [place]  [address]   Assessment and Plan:   FOR THE DISEASE OF OBESITY: BMI 40.0-44.9, adult (HCC) - Current BMI 44.6 Morbid obesity (HCC)-START BMI 51.49/DATE 03/26/20 Assessment & Plan: Since last office visit on 03/17/23 patient's muscle mass has increased by 2 lb. Fat mass has decreased by 3.2 lb. Total body water has decreased by 1 lb.  Counseling done on how various foods will affect these numbers and how to maximize success  Total lbs lost to date: 32 lb Total weight loss percentage to date: -10.67%    Recommended Dietary Goals Sherby is currently in the action stage of change. As such, her goal is to continue weight management plan.  She has agreed to: continue current plan   Behavioral Intervention We discussed the following today: increasing lean protein intake to established goals, decreasing simple carbohydrates , increasing water intake , keeping healthy foods at home, and avoiding temptations and identifying enticing environmental cues  Additional resources provided today: None  Evidence-based interventions for health behavior change were utilized today including the discussion of self monitoring techniques, problem-solving barriers and SMART goal setting techniques.   Regarding patient's less desirable eating habits and patterns, we employed the technique of small changes.   Pt will specifically work on: Getting back on track with her meal plan for next visit.    Recommended Physical Activity Goals Jerita has been advised to work up to 150 minutes of moderate intensity aerobic activity a week and strengthening exercises 2-3 times per week for cardiovascular health, weight loss maintenance and preservation of muscle mass.   She has agreed to:  Think about enjoyable ways to increase daily physical activity and overcoming barriers to  exercise and Increase physical activity in their day and reduce sedentary time (increase NEAT).   Pharmacotherapy  The risks and benefits of medications were discussed with [name], including alternative treatment options and all questions were answered.  Pt understands these are in addition to appropriate dietary and lifestyle changes as the first line treatment of this medical condition.   Patient encouraged to further educate self about medication prior to starting.  she will contact us with any further questions or concerns.   Our standard clinic controlled substance form was filled out by pt and cosigned by me today after PDMP was reviewed and no discrepancies noted.  I asked my CMA to scan to chart after completion today.    We both agreed to : continue with nutritional and behavioral strategies and Start Phentermine-Topiramate 3.75-23 mg         FOR ASSOCIATED CONDITIONS ADDRESSED TODAY: Insulin resistance- with polyphagia Assessment & Plan: Lab Results  Component Value Date   HGBA1C 5.2 07/29/2022   HGBA1C 5.5 12/14/2021   HGBA1C 5.4 03/23/2021   INSULIN 13.2 12/23/2022   INSULIN 10.9 07/29/2022   INSULIN 18.7 12/14/2021  She is currently compliant with Metformin 500 mg TID. She reports some nausea in the mornings. Increasing Metformin at her last OV has helped. She does endorse some GI issues and nausea in the mornings but adds this has since improved and almost resolved. Pt was eating off plan (ice cream and soups) as she was recovering from strep throat.   Continue with current regimen, no changes made today. Encouraged pt to increase protein intake and decrease simple carbs/sugars --> follow her prudent nutritional meal plan. Will continue to  monitor condition.    Other Specified Feeding or Eating Disorder, Emotional and Binge Eating Behaviors Assessment & Plan: Moods are stable today. Denies any SI/HI. Pt reports increased hunger. She is currently on Wellbutrin XL 300  mg once daily. Tolerating well, no SE reported. Pt endorses increased hunger. She was unable to obtain Zepbound d/t not being covered by insurance. In the past she felt Topiramate was working well, but had difficulty staying awake throughout the day.   Continue with Wellbutrin, no dose changes. Discussed options of other possible medications aside from Zepbound with pt. Will prescribe phentermine-Topiramate today. Reviewed possible risk benefits with pt. All questions and concerns addressed.   Orders: - Start Phentermine-Topiramate 3.75-23 mg    Vitamin D deficiency Assessment & Plan: Lab Results  Component Value Date   VD25OH 56.2 12/23/2022   VD25OH 53.3 07/29/2022   VD25OH 25.6 (L) 12/14/2021  Pt is compliant with ERGO 50K units with good tolerance. No adverse side effects reported. No acute concerns reported. Continue with current supplementation. No changes made today. Will continue monitoring condition as it relates to her weight loss journey.    Follow up:   Return in about 3 weeks (around 05/05/2023). She was informed of the importance of frequent follow up visits to maximize her success with intensive lifestyle modifications for her multiple health conditions.  Subjective:   Chief complaint: Obesity Adryan is here to discuss her progress with her obesity treatment plan. She is on the Category 3 Plan and keeping a food journal and adhering to recommended goals of 1400 calories and 1500 g of protein and states she is following her eating plan approximately 25% of the time. She states she is lifting a weighted ball 20-30 minutes 7 days per week and walking 20-30 minutes 1 days per week.  Interval History:  VERDELL KINCANNON is here for a follow up office visit. Since last OV on 03/17/2023, she is up 1 lb. She recently contracted strep throat and endorses sore throat, headaches, nausea, and fever. She adds that she experienced episodes of hallucinations as well. She was managing with  Tylenol and Ibuprofen. She will take her last dose of antibiotics tomorrow. While recovering she has not been eating on plan; was eating a lot of ice cream and soups. She reports increased hunger.   Pharmacotherapy for weight loss: She is currently taking Metformin (off label use for incretin effect and / or insulin resistance and / or diabetes prevention) with adequate clinical response  and without side effects. and Bupropion (single agent, off label use) with adequate clinical response  and without side effects..   Review of Systems:  Pertinent positives were addressed with patient today.  Reviewed by clinician on day of visit: allergies, medications, problem list, medical history, surgical history, family history, social history, and previous encounter notes.  Weight Summary and Biometrics   Weight Lost Since Last Visit: 1 lb   Weight Gained Since Last Visit: 0    Vitals Temp: 98 F (36.7 C) BP: 117/76 Pulse Rate: 84 SpO2: 100 %     Anthropometric Measurements Height: 5\' 5"  (1.651 m) Weight: 268 lb (121.6 kg) BMI (Calculated): 44.6 Weight at Last Visit: 265 lb Weight Lost Since Last Visit: 1 lb Weight Gained Since Last Visit: 0 Starting Weight: 300 lb Total Weight Loss (lbs): 32 lb (14.5 kg) Peak Weight: 318 lb     Body Composition  Body Fat %: 51.8 % Fat Mass (lbs): 139 lbs Muscle Mass (lbs): 122.8 lbs  Total Body Water (lbs): 97.2 lbs Visceral Fat Rating : 16     Other Clinical Data Fasting: yes Labs: no Today's Visit #: 49 Starting Date: 03/26/20   Objective:   PHYSICAL EXAM: Blood pressure 117/76, pulse 84, temperature 98 F (36.7 C), height 5\' 5"  (1.651 m), weight 268 lb (121.6 kg), SpO2 100%. Body mass index is 44.6 kg/m.  General: she is overweight, cooperative and in no acute distress. PSYCH: Has normal mood, affect and thought process.   HEENT: EOMI, sclerae are anicteric. Lungs: Normal breathing effort, no conversational  dyspnea. Extremities: Moves * 4 Neurologic: A and O * 3, good insight  DIAGNOSTIC DATA REVIEWED: BMET    Component Value Date/Time   NA 139 12/23/2022 0844   NA 138 10/14/2011 0752   K 4.5 12/23/2022 0844   K 4.1 10/14/2011 0752   CL 102 12/23/2022 0844   CL 106 10/14/2011 0752   CO2 25 12/23/2022 0844   CO2 27 10/14/2011 0752   GLUCOSE 84 12/23/2022 0844   GLUCOSE 87 04/16/2016 1123   GLUCOSE 90 10/14/2011 0752   BUN 15 12/23/2022 0844   BUN 11 10/14/2011 0752   CREATININE 0.79 12/23/2022 0844   CREATININE 0.55 (L) 10/14/2011 0752   CALCIUM 9.3 12/23/2022 0844   CALCIUM 9.0 10/14/2011 0752   GFRNONAA >90 01/29/2014 1950   GFRNONAA >60 10/14/2011 0752   GFRAA >90 01/29/2014 1950   GFRAA >60 10/14/2011 0752   Lab Results  Component Value Date   HGBA1C 5.2 07/29/2022   HGBA1C 5.4 03/23/2021   Lab Results  Component Value Date   INSULIN 13.2 12/23/2022   INSULIN 22.2 03/26/2020   Lab Results  Component Value Date   TSH 2.330 12/23/2022   CBC    Component Value Date/Time   WBC 5.5 12/23/2022 0844   WBC 8.0 12/15/2015 0905   RBC 3.85 12/23/2022 0844   RBC 3.86 (L) 12/15/2015 0905   HGB 10.9 (L) 12/23/2022 0844   HCT 35.5 12/23/2022 0844   PLT 305 12/23/2022 0844   MCV 92 12/23/2022 0844   MCV 91 10/14/2011 0752   MCH 28.3 12/23/2022 0844   MCH 29.8 12/15/2015 0905   MCHC 30.7 (L) 12/23/2022 0844   MCHC 33.0 12/15/2015 0905   RDW 14.1 12/23/2022 0844   RDW 14.1 10/14/2011 0752   Iron Studies No results found for: "IRON", "TIBC", "FERRITIN", "IRONPCTSAT" Lipid Panel     Component Value Date/Time   CHOL 218 (H) 12/23/2022 0844   TRIG 104 12/23/2022 0844   HDL 55 12/23/2022 0844   LDLCALC 144 (H) 12/23/2022 0844   Hepatic Function Panel     Component Value Date/Time   PROT 6.7 12/23/2022 0844   PROT 7.4 10/14/2011 0752   ALBUMIN 4.3 12/23/2022 0844   ALBUMIN 3.8 10/14/2011 0752   AST 16 12/23/2022 0844   AST 8 (L) 10/14/2011 0752   ALT 14  12/23/2022 0844   ALT 13 10/14/2011 0752   ALKPHOS 54 12/23/2022 0844   ALKPHOS 48 (L) 10/14/2011 0752   BILITOT 0.3 12/23/2022 0844   BILITOT 0.4 10/14/2011 0752      Component Value Date/Time   TSH 2.330 12/23/2022 0844   Nutritional Lab Results  Component Value Date   VD25OH 56.2 12/23/2022   VD25OH 53.3 07/29/2022   VD25OH 25.6 (L) 12/14/2021    Attestations:   Burnett Sheng, acting as a medical scribe for Thomasene Lot, DO., have compiled all relevant documentation for today's office visit on  behalf of Thomasene Lot, DO, while in the presence of Thomasene Lot, DO.  Reviewed by clinician on day of visit: allergies, medications, problem list, medical history, surgical history, family history, social history, and previous encounter notes pertinent to patient's obesity diagnosis.  I have reviewed the above documentation for accuracy and completeness, and I agree with the above. [name], D.O.  The 21st Century Cures Act was signed into law in 2016 which includes the topic of electronic health records.  This provides immediate access to information in MyChart.  This includes consultation notes, operative notes, office notes, lab results and pathology reports.  If you have any questions about what you read please let us know at your next visit so we can discuss your concerns and take corrective action if need be.  We are right here with you.

## 2023-04-19 ENCOUNTER — Other Ambulatory Visit (INDEPENDENT_AMBULATORY_CARE_PROVIDER_SITE_OTHER): Payer: Self-pay | Admitting: Family Medicine

## 2023-04-19 MED ORDER — PHENTERMINE HCL 15 MG PO CAPS: 15.0000 mg | ORAL_CAPSULE | ORAL | 0 refills | Status: DC

## 2023-04-19 MED ORDER — TOPIRAMATE 50 MG PO TABS: ORAL_TABLET | ORAL | 0 refills | Status: DC

## 2023-04-19 NOTE — Telephone Encounter (Signed)
 Contacted pharmacy to get a better understanding. Per pharmacy this medication requires a PA. Called left message for patient informing her a PA will be submitted and we will keep her updated. Also stated patient may want to check the cash price of medication as it may be cheaper or insurance may deny it.

## 2023-04-19 NOTE — Telephone Encounter (Signed)
 Called patient and no answer. Sent patient a Mychart message letting her know a new prescription has been sent to the pharmacy.

## 2023-04-19 NOTE — Telephone Encounter (Signed)
 Spoke with pharmacist again, patient's insurance will not cover this medication at all. Cash price is $241.99, what other options would you like for her to try?

## 2023-04-28 ENCOUNTER — Ambulatory Visit (INDEPENDENT_AMBULATORY_CARE_PROVIDER_SITE_OTHER): Payer: 59 | Admitting: Family Medicine

## 2023-05-10 ENCOUNTER — Encounter (INDEPENDENT_AMBULATORY_CARE_PROVIDER_SITE_OTHER): Payer: Self-pay | Admitting: Family Medicine

## 2023-05-10 ENCOUNTER — Ambulatory Visit (INDEPENDENT_AMBULATORY_CARE_PROVIDER_SITE_OTHER): Payer: 59 | Admitting: Family Medicine

## 2023-05-10 VITALS — BP 131/82 | HR 78 | Temp 97.6°F | Ht 65.0 in | Wt 268.0 lb

## 2023-05-10 DIAGNOSIS — Z6841 Body Mass Index (BMI) 40.0 and over, adult: Secondary | ICD-10-CM

## 2023-05-10 DIAGNOSIS — E88819 Insulin resistance, unspecified: Secondary | ICD-10-CM | POA: Diagnosis not present

## 2023-05-10 DIAGNOSIS — F5089 Other specified eating disorder: Secondary | ICD-10-CM | POA: Diagnosis not present

## 2023-05-10 MED ORDER — PHENTERMINE HCL 15 MG PO CAPS: 15.0000 mg | ORAL_CAPSULE | ORAL | 0 refills | Status: DC

## 2023-05-10 MED ORDER — BUPROPION HCL ER (XL) 300 MG PO TB24: 300.0000 mg | ORAL_TABLET | Freq: Every morning | ORAL | 0 refills | Status: DC

## 2023-05-10 NOTE — Progress Notes (Signed)
 [name], D.O.  ABFM, ABOM Specializing in Clinical Bariatric Medicine  Office located at: [address] [place], [place]  [address]   Assessment and Plan:    No orders of the defined types were placed in this encounter.   Medications Discontinued During This Encounter  Medication Reason   buPROPion (WELLBUTRIN XL) 300 MG 24 hr tablet Reorder   phentermine 15 MG capsule Reorder     Meds ordered this encounter  Medications   buPROPion (WELLBUTRIN XL) 300 MG 24 hr tablet    Sig: Take 1 tablet (300 mg total) by mouth in the morning.    Dispense:  90 tablet    Refill:  0   phentermine 15 MG capsule    Sig: Take 1 capsule (15 mg total) by mouth every morning.    Dispense:  30 capsule    Refill:  0    Not to be filled before April 1st     Consider repeat IC in the future.  FOR THE DISEASE OF OBESITY: BMI 40.0-44.9, adult (HCC) - Current BMI 44.6 Morbid obesity (HCC)-START BMI 51.49/DATE 03/26/20 Assessment & Plan: Since last office visit on 04/14/2023 patient's  Muscle mass has increased by 0.6 lb. Fat mass has decreased by 1 lb. Total body water has decreased by 2 lb.  Counseling done on how various foods will affect these numbers and how to maximize success  Total lbs lost to date: 32 lbs  Total weight loss percentage to date: 10.67%    Recommended Dietary Goals [name] is currently in the action stage of change. As such, her goal is to continue weight management plan.  She has agreed to: continue current plan   Behavioral Intervention We discussed the following today: continue to work on maintaining a reduced calorie state, getting the recommended amount of protein, incorporating whole foods, making healthy choices, staying well hydrated and practicing mindfulness when eating.  Additional resources provided today: Physician provided patient with handouts and personalized instruction on tracking and journaling using Apps (or how to handwrite in notebook)  and using logs provided   Evidence-based interventions for health behavior change were utilized today including the discussion of self monitoring techniques, problem-solving barriers and SMART goal setting techniques.   Regarding patient's less desirable eating habits and patterns, we employed the technique of small changes.   Pt will specifically work on: n/a   Recommended Physical Activity Goals [name] has been advised to work up to 420 minutes of moderate intensity aerobic activity a week and strengthening exercises 2-3 times per week for cardiovascular health, weight loss maintenance and preservation of muscle mass.   She has agreed to : continue walking regiment and START 30 minutes of wt lifting daily.    Pharmacotherapy We both agreed to : continue with nutritional and behavioral strategies and continue all medications at current doses.   FOR ASSOCIATED CONDITIONS ADDRESSED TODAY:  Insulin resistance- with polyphagia Assessment & Plan: Reports compliance with Metformin 500 mg TID, tolerating well. In general, her hunger and cravings are controlled apart from the week of her menstrual cycle.  Continue with current regimen. Encouraged pt to increase protein intake and decrease simple carbs/sugars --> follow her prudent nutritional meal plan.   Other Specified Feeding or Eating Disorder, Emotional and Binge Eating Behaviors Assessment & Plan: Mood stable. LOV, I prescribed Qysmia, however her insurance did not cover it. Thus, I sent in 2 separate prescriptions for Phentermine 15 mg daily and Topiramate 50 mg 0.5 tablet  daily. Reports taking both medications for 2 weeks and had good control over hunger/cravings. Last week, pt was on her menstrual cycle and reports typically feeling "tired and sluggish" during this time. Pt was worried the Phentermine and Topiramate would worsen these symptoms and stopped both about 4-5 days ago.   Pt desires to restart the Phentermine and  Topiramate because it was helping abate her hunger/cravings. Continue Wellbutrin at current dose. Will continue to monitor.  Relevant Orders:  -     buPROPion HCl ER (XL); Take 1 tablet (300 mg total) by mouth in the morning.  Dispense: 90 tablet; Refill: 0 -     Phentermine HCl; Take 1 capsule (15 mg total) by mouth every morning.  Dispense: 30 capsule; Refill: 0   Follow up:   Return 06/06/2023. She was informed of the importance of frequent follow up visits to maximize her success with intensive lifestyle modifications for her multiple health conditions.  Subjective:   Chief complaint: Obesity [name] is here to discuss her progress with her obesity treatment plan. She is keeping a food journal and adhering to recommended goals of 1400-1500 calories and 100+ grams protein daily and states she is following her eating plan approximately 75-80% of the time. She states she is walking and using the medicine ball 20-40 minutes 2-5 days per week.  Interval History:  [name] is here for a follow up office visit. Since last OV on 04/14/2023, [name]'s weight has not changed. States she's journaling her intake and hitting her calorie and protein goals 75-80% of the time. She feels that her clothes fit looser.   Pharmacotherapy for weight loss: She is currently taking  Wellbutrin XL 300 mg daily and Metformin XR 500 mg 3 tablets daily  . Has been off Phentermine & Topiramate for 4-5 days.   Review of Systems:  Pertinent positives were addressed with patient today.  Reviewed by clinician on day of visit: allergies, medications, problem list, medical history, surgical history, family history, social history, and previous encounter notes.  Weight Summary and Biometrics   Weight Lost Since Last Visit: 0  Weight Gained Since Last Visit: 0   Vitals Temp: 97.6 F (36.4 C) BP: 131/82 Pulse Rate: 78 SpO2: 99 %   Anthropometric Measurements Height: 5\' 5"  (1.651 m) Weight: 268 lb  (121.6 kg) BMI (Calculated): 44.6 Weight at Last Visit: 268lb Weight Lost Since Last Visit: 0 Weight Gained Since Last Visit: 0 Starting Weight: 300lb Total Weight Loss (lbs): 32 lb (14.5 kg) Peak Weight: 318lb   Body Composition  Body Fat %: 51.5 % Fat Mass (lbs): 138 lbs Muscle Mass (lbs): 123.4 lbs Total Body Water (lbs): 95.2 lbs Visceral Fat Rating : 16   Other Clinical Data Fasting: yes Labs: no Today's Visit #: 50 Starting Date: 03/26/20   Objective:   PHYSICAL EXAM: Blood pressure 131/82, pulse 78, temperature 97.6 F (36.4 C), height 5\' 5"  (1.651 m), weight 268 lb (121.6 kg), last menstrual period 04/19/2023, SpO2 99%. Body mass index is 44.6 kg/m.  General: she is overweight, cooperative and in no acute distress. PSYCH: Has normal mood, affect and thought process.   HEENT: EOMI, sclerae are anicteric. Lungs: Normal breathing effort, no conversational dyspnea. Extremities: Moves * 4 Neurologic: A and O * 3, good insight  DIAGNOSTIC DATA REVIEWED: BMET    Component Value Date/Time   NA 139 12/23/2022 0844   NA 138 10/14/2011 0752   K 4.5 12/23/2022 0844   K 4.1 10/14/2011 0752  CL 102 12/23/2022 0844   CL 106 10/14/2011 0752   CO2 25 12/23/2022 0844   CO2 27 10/14/2011 0752   GLUCOSE 84 12/23/2022 0844   GLUCOSE 87 04/16/2016 1123   GLUCOSE 90 10/14/2011 0752   BUN 15 12/23/2022 0844   BUN 11 10/14/2011 0752   CREATININE 0.79 12/23/2022 0844   CREATININE 0.55 (L) 10/14/2011 0752   CALCIUM 9.3 12/23/2022 0844   CALCIUM 9.0 10/14/2011 0752   GFRNONAA >90 01/29/2014 1950   GFRNONAA >60 10/14/2011 0752   GFRAA >90 01/29/2014 1950   GFRAA >60 10/14/2011 0752   Lab Results  Component Value Date   HGBA1C 5.2 07/29/2022   HGBA1C 5.4 03/23/2021   Lab Results  Component Value Date   INSULIN 13.2 12/23/2022   INSULIN 22.2 03/26/2020   Lab Results  Component Value Date   TSH 2.330 12/23/2022   CBC    Component Value Date/Time   WBC 5.5  12/23/2022 0844   WBC 8.0 12/15/2015 0905   RBC 3.85 12/23/2022 0844   RBC 3.86 (L) 12/15/2015 0905   HGB 10.9 (L) 12/23/2022 0844   HCT 35.5 12/23/2022 0844   PLT 305 12/23/2022 0844   MCV 92 12/23/2022 0844   MCV 91 10/14/2011 0752   MCH 28.3 12/23/2022 0844   MCH 29.8 12/15/2015 0905   MCHC 30.7 (L) 12/23/2022 0844   MCHC 33.0 12/15/2015 0905   RDW 14.1 12/23/2022 0844   RDW 14.1 10/14/2011 0752   Iron Studies No results found for: "IRON", "TIBC", "FERRITIN", "IRONPCTSAT" Lipid Panel     Component Value Date/Time   CHOL 218 (H) 12/23/2022 0844   TRIG 104 12/23/2022 0844   HDL 55 12/23/2022 0844   LDLCALC 144 (H) 12/23/2022 0844   Hepatic Function Panel     Component Value Date/Time   PROT 6.7 12/23/2022 0844   PROT 7.4 10/14/2011 0752   ALBUMIN 4.3 12/23/2022 0844   ALBUMIN 3.8 10/14/2011 0752   AST 16 12/23/2022 0844   AST 8 (L) 10/14/2011 0752   ALT 14 12/23/2022 0844   ALT 13 10/14/2011 0752   ALKPHOS 54 12/23/2022 0844   ALKPHOS 48 (L) 10/14/2011 0752   BILITOT 0.3 12/23/2022 0844   BILITOT 0.4 10/14/2011 0752      Component Value Date/Time   TSH 2.330 12/23/2022 0844   Nutritional Lab Results  Component Value Date   VD25OH 56.2 12/23/2022   VD25OH 53.3 07/29/2022   VD25OH 25.6 (L) 12/14/2021    Attestations:   I, Special Puri, acting as a Stage manager for Thomasene Lot, DO., have compiled all relevant documentation for today's office visit on behalf of Thomasene Lot, DO, while in the presence of [employer], DO.  Reviewed by clinician on day of visit: allergies, medications, problem list, medical history, surgical history, family history, social history, and previous encounter notes pertinent to patient's obesity diagnosis. I have spent 41 minutes in the care of the patient today including: preparing to see patient (e.g. review and interpretation of tests, old notes ), obtaining and/or reviewing separately obtained history, performing a  medically appropriate examination or evaluation, counseling and educating the patient, ordering medications, test or procedures, documenting clinical information in the electronic or other health care record, and independently interpreting results and communicating results to the patient, family, or caregiver   I have reviewed the above documentation for accuracy and completeness, and I agree with the above. [name], D.O.  The 21st Century Cures Act was signed into  law in 2016 which includes the topic of electronic health records.  This provides immediate access to information in MyChart.  This includes consultation notes, operative notes, office notes, lab results and pathology reports.  If you have any questions about what you read please let us know at your next visit so we can discuss your concerns and take corrective action if need be.  We are right here with you.

## 2023-06-06 ENCOUNTER — Ambulatory Visit (INDEPENDENT_AMBULATORY_CARE_PROVIDER_SITE_OTHER): Payer: 59 | Admitting: Family Medicine

## 2023-06-06 ENCOUNTER — Encounter (INDEPENDENT_AMBULATORY_CARE_PROVIDER_SITE_OTHER): Payer: Self-pay | Admitting: Family Medicine

## 2023-06-06 VITALS — BP 118/78 | HR 76 | Temp 97.8°F | Ht 65.0 in | Wt 272.0 lb

## 2023-06-06 DIAGNOSIS — Z6841 Body Mass Index (BMI) 40.0 and over, adult: Secondary | ICD-10-CM | POA: Diagnosis not present

## 2023-06-06 DIAGNOSIS — E88819 Insulin resistance, unspecified: Secondary | ICD-10-CM

## 2023-06-06 DIAGNOSIS — F5089 Other specified eating disorder: Secondary | ICD-10-CM

## 2023-06-06 MED ORDER — PHENTERMINE HCL 15 MG PO CAPS
15.0000 mg | ORAL_CAPSULE | ORAL | 0 refills | Status: AC
Start: 2023-06-06 — End: ?

## 2023-06-06 MED ORDER — BUPROPION HCL ER (XL) 150 MG PO TB24: 150.0000 mg | ORAL_TABLET | ORAL | 0 refills | Status: AC

## 2023-06-06 MED ORDER — TOPIRAMATE 50 MG PO TABS: ORAL_TABLET | ORAL | 0 refills | Status: AC

## 2023-06-06 NOTE — Progress Notes (Signed)
 [name], D.O.  ABFM, ABOM Specializing in Clinical Bariatric Medicine  Office located at: [address] [place], [place]  [address]   Assessment and Plan:  No orders of the defined types were placed in this encounter.  Medications Discontinued During This Encounter  Medication Reason   metFORMIN  (GLUCOPHAGE -XR) 500 MG 24 hr tablet    buPROPion  (WELLBUTRIN  XL) 300 MG 24 hr tablet Dose change   topiramate  (TOPAMAX ) 50 MG tablet Reorder   phentermine  15 MG capsule Reorder    Meds ordered this encounter  Medications   phentermine  15 MG capsule    Sig: Take 1 capsule (15 mg total) by mouth every morning.    Dispense:  30 capsule    Refill:  0    Not to be filled before MAY 6th   topiramate  (TOPAMAX ) 50 MG tablet    Sig: One half tab by mouth q am    Dispense:  30 tablet    Refill:  0   buPROPion  (WELLBUTRIN  XL) 150 MG 24 hr tablet    Sig: Take 1 tablet (150 mg total) by mouth every morning.    Dispense:  90 tablet    Refill:  0     FOR THE DISEASE OF OBESITY:  BMI 40.0-44.9, adult (HCC) - Current BMI 45.26 Morbid obesity (HCC)-START BMI 51.49/DATE 03/26/20 Assessment & Plan: Since last office visit on 05/10/2023, patient's  Muscle mass has decreased by 3.8 lbs. Fat mass has increased by 8.6 lbs. Total body water not calculated.  Counseling done on how various foods will affect these numbers and how to maximize success  Total lbs lost to date: 28 lbs Total weight loss percentage to date: 9.33%    Recommended Dietary Goals [name] is currently in the action stage of change. As such, her goal is to continue weight management plan.  She has agreed to: continue current plan   Behavioral Intervention We discussed the following today: increasing lean protein intake to established goals, decreasing eating out or consumption of processed foods, and making healthy choices when eating convenient foods, and continue to work on implementation of reduced calorie  nutritional plan  Additional resources provided today: Handout on Food Journaling Log  Evidence-based interventions for health behavior change were utilized today including the discussion of self monitoring techniques, problem-solving barriers and SMART goal setting techniques.   Regarding patient's less desirable eating habits and patterns, we employed the technique of small changes.   Pt will specifically work on: Continue consistently journaling for next visit.    Recommended Physical Activity Goals [name] has been advised to work up to 300-450 minutes of moderate intensity aerobic activity a week and strengthening exercises 2-3 times per week for cardiovascular health, weight loss maintenance and preservation of muscle mass.   She has agreed to :  Continue current level of physical activity    Pharmacotherapy We both agreed to :  Decrease Wellbutrin  XL dose to 150 mg daily   FOR ASSOCIATED CONDITIONS ADDRESSED TODAY:  Insulin  resistance- with polyphagia Assessment & Plan: [name] is not currently taking medications for this condition. She recently tried to get Zepbound  covered, but insurance did not comply. Pt reports running out of Metformin  two weeks ago, so has not been taking. She prefers to discontinue this medication all together as she does not feel it is helping. However, since being off of the Metformin  [name] reports occasional constipation. Encouraged pt to ensure she is keeping up with her water intake and incorporating  fiber rich foods in her diet. Will discontinue Metformin  today. Continue following journaling parameters with high lean protein and low simple carbs.    Other Specified Feeding or Eating Disorder, Emotional and Binge Eating Behaviors Assessment & Plan: [name] is on Wellbutrin  XL 300 mg daily, Topiramate  25 mg daily, and Phentermine  15 mg daily. Medications all tolerated, hunger/cravings well controlled. She has been on this dose of Wellbutrin  XL  since 01/10/2023. Pt reports not feeling a difference in hunger/cravings since dose change, desires to go back to 150 mg. Topiramate  and Phentermine  were restarted during LOV. Pt denies feeling "tired and sluggish" since restarting these doses. [name] feels the are helping to lessen her hunger and cravings. Additionally, pt's mood is currently stable. Pt denies seeing a current counselor.   Will decrease Wellbutrin  XL dose to 150 mg daily. Continue other medications at current dose. Will refill Topiramate  and Phentermine  today. Will continue to monitor condition closely.  Relevant Orders:  -     Phentermine  HCl; Take 1 capsule (15 mg total) by mouth every morning.  Dispense: 30 capsule; Refill: 0 -     Topiramate ; One half tab by mouth q am  Dispense: 30 tablet; Refill: 0 -     buPROPion  HCl ER (XL); Take 1 tablet (150 mg total) by mouth every morning.  Dispense: 90 tablet; Refill: 0  Follow up:   Return in about 4 weeks (around 07/04/2023). She was informed of the importance of frequent follow up visits to maximize her success with intensive lifestyle modifications for her multiple health conditions.  Subjective:   Chief complaint: Obesity [name] is here to discuss her progress with her obesity treatment plan. She is keeping a food journal and adhering to recommended goals of 1400-1500 calories and 100+ grams protein daily and states she is following her eating plan approximately 90% of the time. She states she is walking and weight lifting 30 minutes 3-4 days per week.  Interval History:  [name] is here for a follow up office visit. Since last OV on 05/10/2023, pt is up 4 lbs. She admits to having a "rough" weekend with the recent holidays. However, pt is feeling good and sticking to her MP. [name] is also journaling her meal intake. She reports drinking a protein shake on days that she notices a deficit in her protein intake. Additionally, pt has recently started regular weight  lifting.   Pharmacotherapy for weight loss: She is currently taking  Topiramate  25 mg daily, Phentermine  15 mg daily, and Wellbutrin  XL 300 mg daily .   Review of Systems:  Pertinent positives were addressed with patient today.  Reviewed by clinician on day of visit: allergies, medications, problem list, medical history, surgical history, family history, social history, and previous encounter notes.  Weight Summary and Biometrics   Weight Lost Since Last Visit: 0  Weight Gained Since Last Visit: 4lb    Vitals Temp: 97.8 F (36.6 C) BP: 118/78 Pulse Rate: 76 SpO2: 100 %   Anthropometric Measurements Height: 5\' 5"  (1.651 m) Weight: 272 lb (123.4 kg) BMI (Calculated): 45.26 Weight at Last Visit: 268lb Weight Lost Since Last Visit: 0 Weight Gained Since Last Visit: 4lb Starting Weight: 300lb Total Weight Loss (lbs): 28 lb (12.7 kg) Peak Weight: 318lb   Body Composition  Body Fat %: 53.8 % Fat Mass (lbs): 146.6 lbs Muscle Mass (lbs): 119.6 lbs Visceral Fat Rating : 45.4   Other Clinical Data Fasting: No Labs: no Today's Visit #: 51 Starting Date:  03/26/20    Objective:   PHYSICAL EXAM: Blood pressure 118/78, pulse 76, temperature 97.8 F (36.6 C), height 5\' 5"  (1.651 m), weight 272 lb (123.4 kg), last menstrual period 04/19/2023, SpO2 100%. Body mass index is 45.26 kg/m.  General: she is overweight, cooperative and in no acute distress. PSYCH: Has normal mood, affect and thought process.   HEENT: EOMI, sclerae are anicteric. Lungs: Normal breathing effort, no conversational dyspnea. Extremities: Moves * 4 Neurologic: A and O * 3, good insight  DIAGNOSTIC DATA REVIEWED: BMET    Component Value Date/Time   NA 139 12/23/2022 0844   NA 138 10/14/2011 0752   K 4.5 12/23/2022 0844   K 4.1 10/14/2011 0752   CL 102 12/23/2022 0844   CL 106 10/14/2011 0752   CO2 25 12/23/2022 0844   CO2 27 10/14/2011 0752   GLUCOSE 84 12/23/2022 0844   GLUCOSE 87  04/16/2016 1123   GLUCOSE 90 10/14/2011 0752   BUN 15 12/23/2022 0844   BUN 11 10/14/2011 0752   CREATININE 0.79 12/23/2022 0844   CREATININE 0.55 (L) 10/14/2011 0752   CALCIUM 9.3 12/23/2022 0844   CALCIUM 9.0 10/14/2011 0752   GFRNONAA >90 01/29/2014 1950   GFRNONAA >60 10/14/2011 0752   GFRAA >90 01/29/2014 1950   GFRAA >60 10/14/2011 0752   Lab Results  Component Value Date   HGBA1C 5.2 07/29/2022   HGBA1C 5.4 03/23/2021   Lab Results  Component Value Date   INSULIN  13.2 12/23/2022   INSULIN  22.2 03/26/2020   Lab Results  Component Value Date   TSH 2.330 12/23/2022   CBC    Component Value Date/Time   WBC 5.5 12/23/2022 0844   WBC 8.0 12/15/2015 0905   RBC 3.85 12/23/2022 0844   RBC 3.86 (L) 12/15/2015 0905   HGB 10.9 (L) 12/23/2022 0844   HCT 35.5 12/23/2022 0844   PLT 305 12/23/2022 0844   MCV 92 12/23/2022 0844   MCV 91 10/14/2011 0752   MCH 28.3 12/23/2022 0844   MCH 29.8 12/15/2015 0905   MCHC 30.7 (L) 12/23/2022 0844   MCHC 33.0 12/15/2015 0905   RDW 14.1 12/23/2022 0844   RDW 14.1 10/14/2011 0752   Iron Studies No results found for: "IRON", "TIBC", "FERRITIN", "IRONPCTSAT" Lipid Panel     Component Value Date/Time   CHOL 218 (H) 12/23/2022 0844   TRIG 104 12/23/2022 0844   HDL 55 12/23/2022 0844   LDLCALC 144 (H) 12/23/2022 0844   Hepatic Function Panel     Component Value Date/Time   PROT 6.7 12/23/2022 0844   PROT 7.4 10/14/2011 0752   ALBUMIN 4.3 12/23/2022 0844   ALBUMIN 3.8 10/14/2011 0752   AST 16 12/23/2022 0844   AST 8 (L) 10/14/2011 0752   ALT 14 12/23/2022 0844   ALT 13 10/14/2011 0752   ALKPHOS 54 12/23/2022 0844   ALKPHOS 48 (L) 10/14/2011 0752   BILITOT 0.3 12/23/2022 0844   BILITOT 0.4 10/14/2011 0752      Component Value Date/Time   TSH 2.330 12/23/2022 0844   Nutritional Lab Results  Component Value Date   VD25OH 56.2 12/23/2022   VD25OH 53.3 07/29/2022   VD25OH 25.6 (L) 12/14/2021    Attestations:   I,  Camryn Mix, acting as a Stage manager for [employer], DO., have compiled all relevant documentation for today's office visit on behalf of Marceil Sensor, DO, while in the presence of [employer], DO.  I have reviewed the above documentation for accuracy and  completeness, and I agree with the above. [name], D.O.  The 21st Century Cures Act was signed into law in 2016 which includes the topic of electronic health records.  This provides immediate access to information in MyChart.  This includes consultation notes, operative notes, office notes, lab results and pathology reports.  If you have any questions about what you read please let us  know at your next visit so we can discuss your concerns and take corrective action if need be.  We are right here with you.

## 2023-07-04 ENCOUNTER — Ambulatory Visit (INDEPENDENT_AMBULATORY_CARE_PROVIDER_SITE_OTHER): Admitting: Family Medicine

## 2023-08-03 ENCOUNTER — Ambulatory Visit (INDEPENDENT_AMBULATORY_CARE_PROVIDER_SITE_OTHER): Admitting: Family Medicine

## 2023-11-16 IMAGING — US US PELVIS COMPLETE WITH TRANSVAGINAL
1 series · 13 of 25 positions shown · non-contrast
Comparison: Prior study from 10/28/2015.

CLINICAL DATA: Initial evaluation for intermittent dysfunctional
uterine bleeding for 1 year.

EXAM:
TRANSABDOMINAL AND TRANSVAGINAL ULTRASOUND OF PELVIS
TECHNIQUE: Both transabdominal and transvaginal ultrasound examinations of the
pelvis were performed. Transabdominal technique was performed for
global imaging of the pelvis including uterus, ovaries, adnexal
regions, and pelvic cul-de-sac. It was necessary to proceed with
endovaginal exam following the transabdominal exam to visualize the
endometrium and ovaries.

[Series 1: us pelvis complete with transvaginal · 0.28mm/px · 72 acquisitions, 13 frames shown]
[im 1/72]
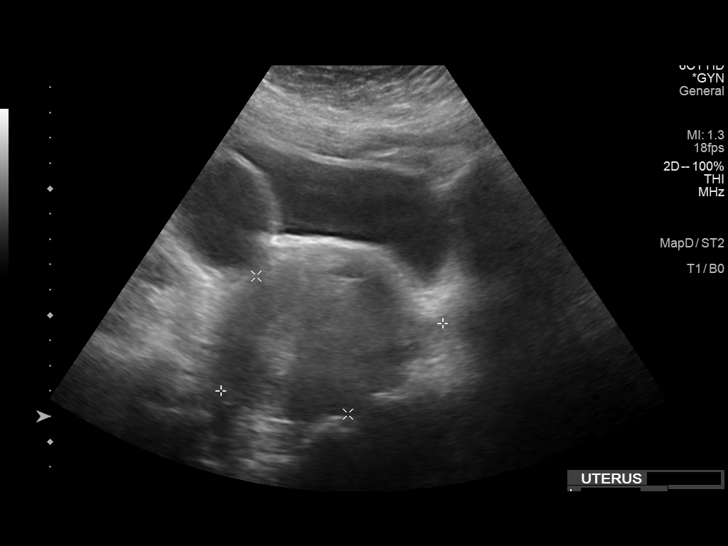
[im 6/72]
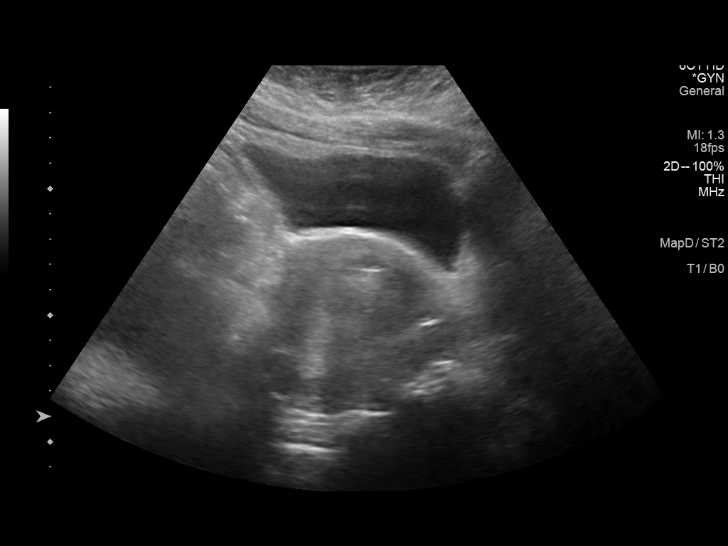
[im 12/72]
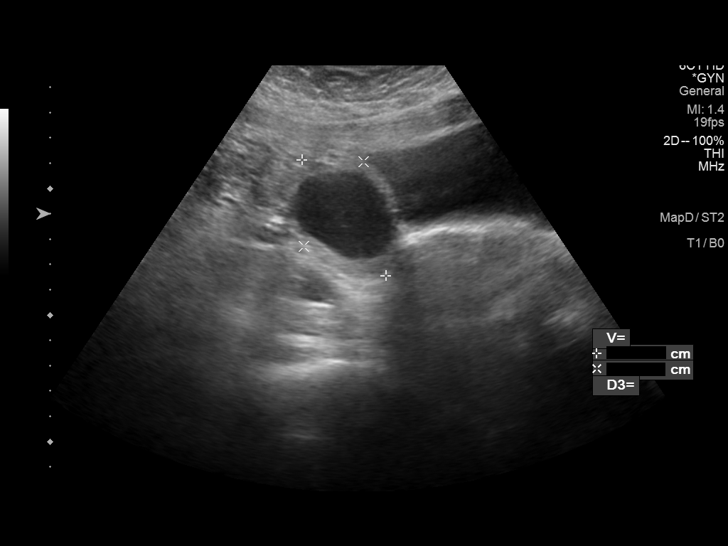
[im 18/72]
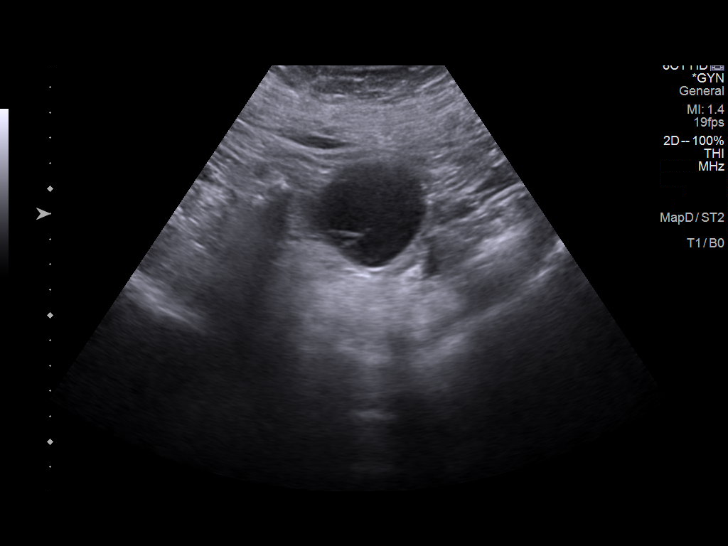
[im 24/72]
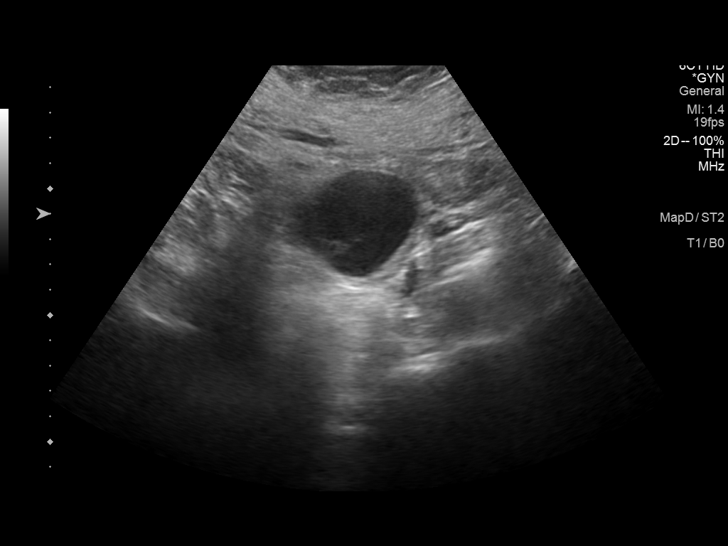
[im 30/72]
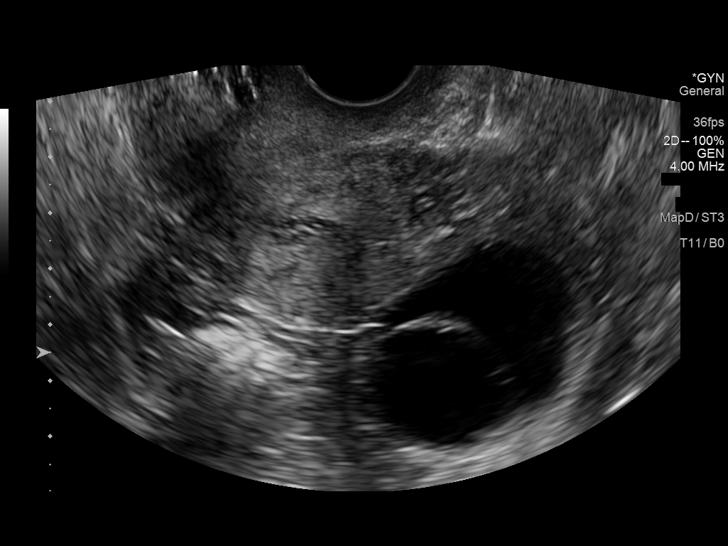
[im 36/72]
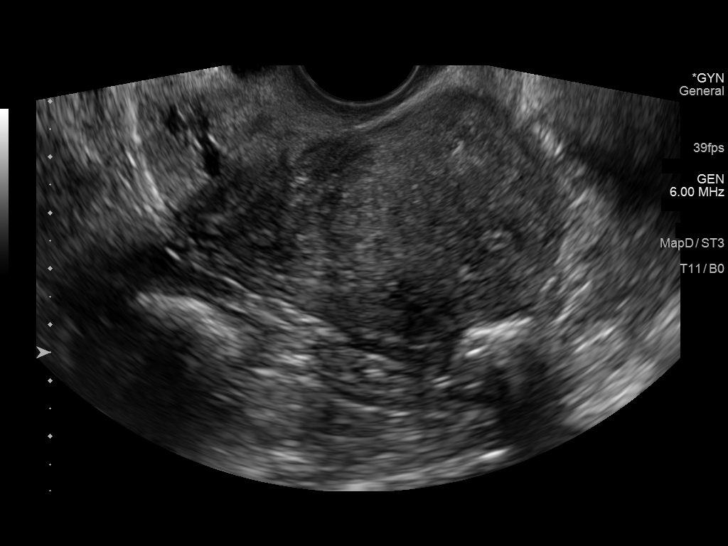
[im 42/72]
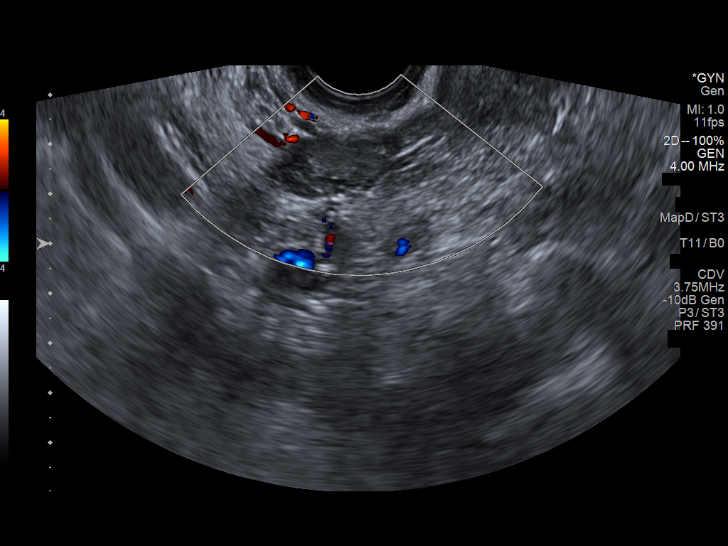
[im 48/72]
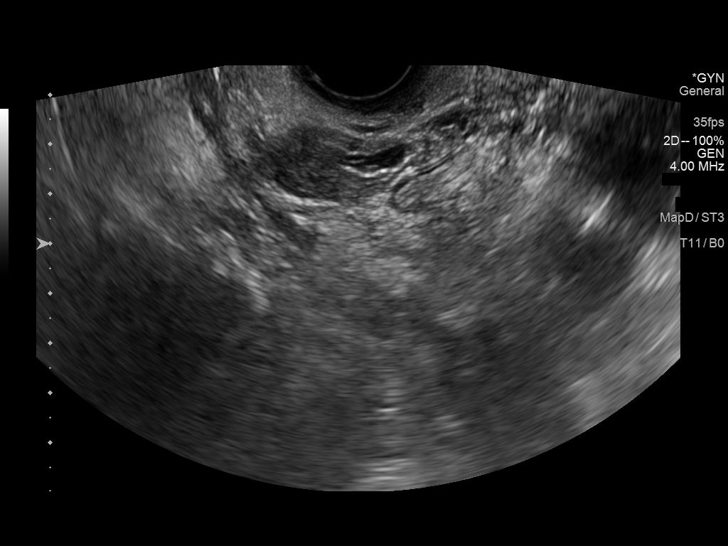
[im 54/72]
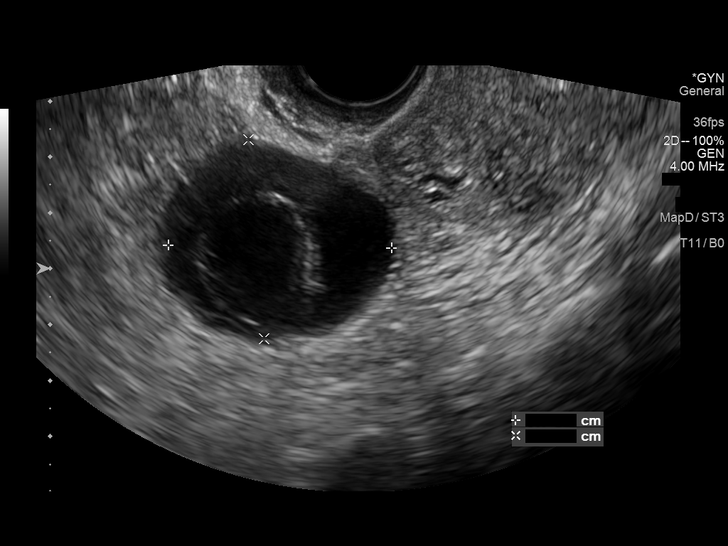
[im 60/72]
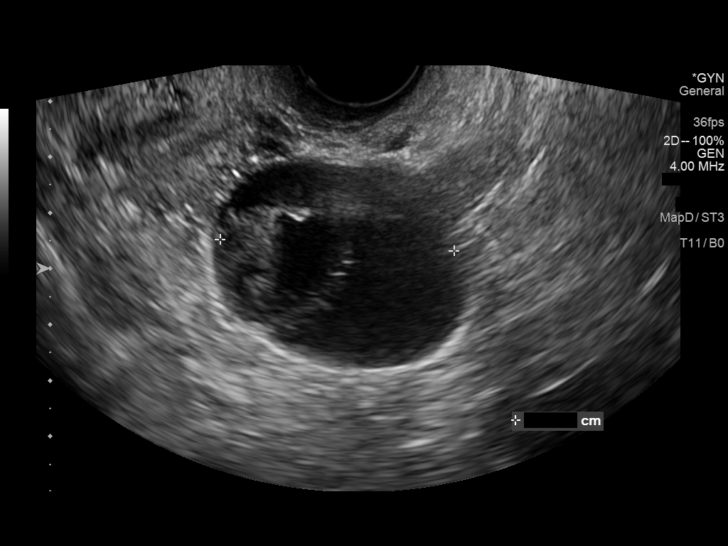
[im 66/72]
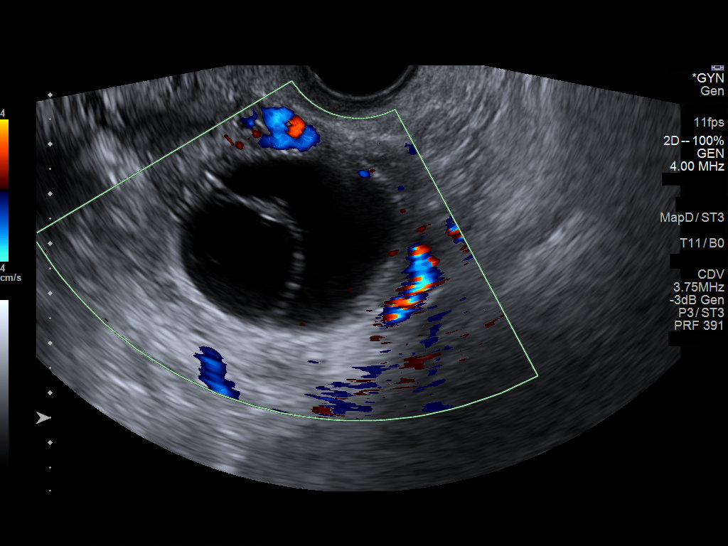
[im 72/72]
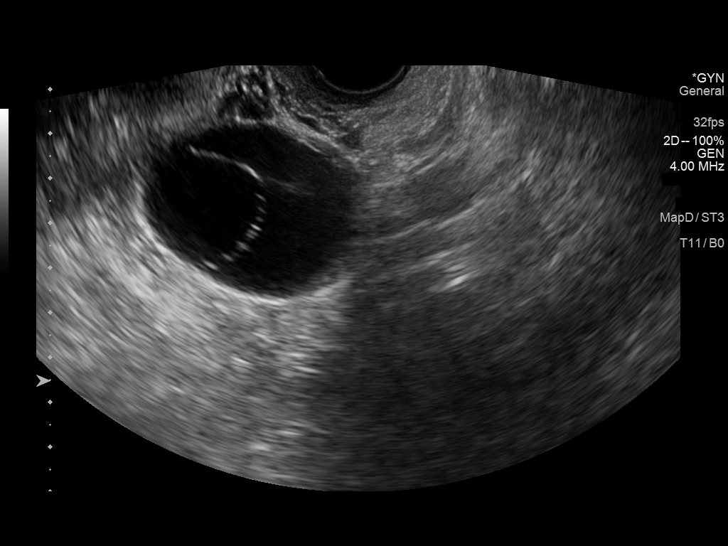

[13 of 25 positions shown; findings below may reference images not displayed]

FINDINGS: Uterus

Measurements: 9.2 x 6.5 x 6.5 cm = volume: 203.6 mL. Uterus is
retroflexed. No discrete fibroid or other myometrial abnormality.

Endometrium

Thickness: 12.2 mm.  No focal abnormality visualized.

Right ovary

Measurements: 2.5 x 1.4 x 1.5 cm = volume: 2.7 mL. Normal
appearance/no adnexal mass.

Left ovary

Measurements: 5.6 x 4.3 x 5.2 cm = volume: 66.1 mL. 4.0 x 3.6 x
cm complex cyst seen arising from the left ovary. Internal
curvilinear septation versus prominent internal daughter cyst. There
is question of associated internal mural nodularity and possible
vascularity (image 56), although this is not clearly seen on many of
the cine images.

Other findings

No abnormal free fluid.
IMPRESSION: 1. 4.2 cm complex left ovarian cyst with possible internal mural
nodularity as above. Finding is indeterminate, but could reflect a
cystic ovarian neoplasm. Gynecologic referral for further workup and
consultation recommended. Additionally, a short interval follow-up
ultrasound in 6-12 weeks is suggested for further evaluation.
2. Endometrial stripe measures 12.2 mm in thickness. If bleeding
remains unresponsive to hormonal or medical therapy, sonohysterogram
should be considered for focal lesion work-up. (Ref: Radiological
Reasoning: Algorithmic Workup of Abnormal Vaginal Bleeding with
Endovaginal Sonography and Sonohysterography. AJR [id number]; [id number]).
3. Otherwise unremarkable pelvic ultrasound.

## 2023-11-30 ENCOUNTER — Encounter (INDEPENDENT_AMBULATORY_CARE_PROVIDER_SITE_OTHER): Payer: Self-pay

## 2024-02-20 ENCOUNTER — Other Ambulatory Visit (INDEPENDENT_AMBULATORY_CARE_PROVIDER_SITE_OTHER): Payer: Self-pay | Admitting: Family Medicine

## 2024-03-22 ENCOUNTER — Other Ambulatory Visit (HOSPITAL_COMMUNITY): Payer: Self-pay

## 2024-03-23 ENCOUNTER — Other Ambulatory Visit (HOSPITAL_COMMUNITY): Payer: Self-pay

## 2024-04-23 ENCOUNTER — Ambulatory Visit: Admitting: Neurology
